# Patient Record
Sex: Female | Born: 1940
Health system: Southern US, Community
[De-identification: ages and names within clinical notes are randomized; demographics above are authoritative.]

## PROBLEM LIST (undated history)

## (undated) DIAGNOSIS — K635 Polyp of colon: Secondary | ICD-10-CM

## (undated) DIAGNOSIS — D259 Leiomyoma of uterus, unspecified: Secondary | ICD-10-CM

## (undated) DIAGNOSIS — N950 Postmenopausal bleeding: Secondary | ICD-10-CM

## (undated) DIAGNOSIS — E559 Vitamin D deficiency, unspecified: Secondary | ICD-10-CM

## (undated) DIAGNOSIS — E119 Type 2 diabetes mellitus without complications: Secondary | ICD-10-CM

## (undated) DIAGNOSIS — IMO0002 Reserved for concepts with insufficient information to code with codable children: Secondary | ICD-10-CM

## (undated) DIAGNOSIS — E785 Hyperlipidemia, unspecified: Secondary | ICD-10-CM

## (undated) DIAGNOSIS — N6019 Diffuse cystic mastopathy of unspecified breast: Secondary | ICD-10-CM

## (undated) DIAGNOSIS — M1990 Unspecified osteoarthritis, unspecified site: Secondary | ICD-10-CM

## (undated) DIAGNOSIS — N841 Polyp of cervix uteri: Secondary | ICD-10-CM

## (undated) DIAGNOSIS — I776 Arteritis, unspecified: Secondary | ICD-10-CM

## (undated) DIAGNOSIS — L509 Urticaria, unspecified: Secondary | ICD-10-CM

## (undated) DIAGNOSIS — I10 Essential (primary) hypertension: Secondary | ICD-10-CM

## (undated) HISTORY — DX: Diffuse cystic mastopathy of unspecified breast: N60.19

## (undated) HISTORY — DX: Urticaria, unspecified: L50.9

## (undated) HISTORY — DX: Polyp of colon: K63.5

## (undated) HISTORY — DX: Vitamin D deficiency, unspecified: E55.9

## (undated) HISTORY — DX: Reserved for concepts with insufficient information to code with codable children: IMO0002

## (undated) HISTORY — DX: Leiomyoma of uterus, unspecified: D25.9

## (undated) HISTORY — DX: Hyperlipidemia, unspecified: E78.5

## (undated) HISTORY — DX: Essential (primary) hypertension: I10

## (undated) HISTORY — DX: Type 2 diabetes mellitus without complications: E11.9

## (undated) HISTORY — DX: Unspecified osteoarthritis, unspecified site: M19.90

## (undated) HISTORY — PX: TONSILLECTOMY AND ADENOIDECTOMY: SUR1326

## (undated) HISTORY — DX: Postmenopausal bleeding: N95.0

## (undated) HISTORY — DX: Polyp of cervix uteri: N84.1

## (undated) HISTORY — DX: Arteritis, unspecified: I77.6

## (undated) HISTORY — PX: INCISION AND DRAINAGE: SHX5863

## (undated) HISTORY — PX: BREAST BIOPSY: SHX20

---

## 1981-03-18 DIAGNOSIS — IMO0002 Reserved for concepts with insufficient information to code with codable children: Secondary | ICD-10-CM

## 1981-03-18 DIAGNOSIS — R87619 Unspecified abnormal cytological findings in specimens from cervix uteri: Secondary | ICD-10-CM

## 1981-03-18 HISTORY — DX: Unspecified abnormal cytological findings in specimens from cervix uteri: R87.619

## 1981-03-18 HISTORY — DX: Reserved for concepts with insufficient information to code with codable children: IMO0002

## 1997-12-18 DIAGNOSIS — N841 Polyp of cervix uteri: Secondary | ICD-10-CM

## 1997-12-18 HISTORY — DX: Polyp of cervix uteri: N84.1

## 1998-02-15 DIAGNOSIS — N950 Postmenopausal bleeding: Secondary | ICD-10-CM

## 1998-02-15 HISTORY — DX: Postmenopausal bleeding: N95.0

## 1998-02-19 ENCOUNTER — Other Ambulatory Visit: Admission: RE | Admit: 1998-02-19 | Discharge: 1998-02-19 | Payer: Self-pay | Admitting: *Deleted

## 1998-12-20 ENCOUNTER — Other Ambulatory Visit: Admission: RE | Admit: 1998-12-20 | Discharge: 1998-12-20 | Payer: Self-pay | Admitting: *Deleted

## 1999-02-15 ENCOUNTER — Encounter: Payer: Self-pay | Admitting: Surgery

## 1999-02-15 ENCOUNTER — Ambulatory Visit (HOSPITAL_COMMUNITY): Admission: RE | Admit: 1999-02-15 | Discharge: 1999-02-15 | Payer: Self-pay | Admitting: Surgery

## 1999-07-20 ENCOUNTER — Ambulatory Visit (HOSPITAL_COMMUNITY): Admission: RE | Admit: 1999-07-20 | Discharge: 1999-07-20 | Payer: Self-pay | Admitting: *Deleted

## 1999-07-20 ENCOUNTER — Encounter: Payer: Self-pay | Admitting: *Deleted

## 1999-12-08 ENCOUNTER — Other Ambulatory Visit: Admission: RE | Admit: 1999-12-08 | Discharge: 1999-12-08 | Payer: Self-pay | Admitting: *Deleted

## 2000-02-09 ENCOUNTER — Encounter: Payer: Self-pay | Admitting: *Deleted

## 2000-02-09 ENCOUNTER — Encounter: Admission: RE | Admit: 2000-02-09 | Discharge: 2000-02-09 | Payer: Self-pay | Admitting: *Deleted

## 2000-02-20 ENCOUNTER — Encounter: Payer: Self-pay | Admitting: *Deleted

## 2000-02-20 ENCOUNTER — Encounter: Admission: RE | Admit: 2000-02-20 | Discharge: 2000-02-20 | Payer: Self-pay | Admitting: *Deleted

## 2000-05-16 ENCOUNTER — Encounter (INDEPENDENT_AMBULATORY_CARE_PROVIDER_SITE_OTHER): Payer: Self-pay | Admitting: *Deleted

## 2000-05-16 ENCOUNTER — Ambulatory Visit (HOSPITAL_COMMUNITY): Admission: RE | Admit: 2000-05-16 | Discharge: 2000-05-16 | Payer: Self-pay | Admitting: Gastroenterology

## 2000-12-14 ENCOUNTER — Other Ambulatory Visit: Admission: RE | Admit: 2000-12-14 | Discharge: 2000-12-14 | Payer: Self-pay | Admitting: *Deleted

## 2001-02-20 ENCOUNTER — Encounter: Payer: Self-pay | Admitting: *Deleted

## 2001-02-20 ENCOUNTER — Encounter: Admission: RE | Admit: 2001-02-20 | Discharge: 2001-02-20 | Payer: Self-pay | Admitting: *Deleted

## 2001-12-25 ENCOUNTER — Other Ambulatory Visit: Admission: RE | Admit: 2001-12-25 | Discharge: 2001-12-25 | Payer: Self-pay | Admitting: *Deleted

## 2002-03-05 ENCOUNTER — Encounter: Admission: RE | Admit: 2002-03-05 | Discharge: 2002-03-05 | Payer: Self-pay | Admitting: *Deleted

## 2002-03-05 ENCOUNTER — Encounter: Payer: Self-pay | Admitting: *Deleted

## 2003-01-01 ENCOUNTER — Other Ambulatory Visit: Admission: RE | Admit: 2003-01-01 | Discharge: 2003-01-01 | Payer: Self-pay | Admitting: *Deleted

## 2003-03-20 ENCOUNTER — Encounter: Payer: Self-pay | Admitting: *Deleted

## 2003-03-20 ENCOUNTER — Encounter: Admission: RE | Admit: 2003-03-20 | Discharge: 2003-03-20 | Payer: Self-pay | Admitting: *Deleted

## 2004-01-08 ENCOUNTER — Other Ambulatory Visit: Admission: RE | Admit: 2004-01-08 | Discharge: 2004-01-08 | Payer: Self-pay | Admitting: *Deleted

## 2004-04-12 ENCOUNTER — Encounter: Admission: RE | Admit: 2004-04-12 | Discharge: 2004-04-12 | Payer: Self-pay | Admitting: *Deleted

## 2004-04-15 ENCOUNTER — Encounter: Admission: RE | Admit: 2004-04-15 | Discharge: 2004-04-15 | Payer: Self-pay | Admitting: *Deleted

## 2004-11-01 ENCOUNTER — Ambulatory Visit (HOSPITAL_COMMUNITY): Admission: RE | Admit: 2004-11-01 | Discharge: 2004-11-01 | Payer: Self-pay | Admitting: Gastroenterology

## 2004-11-01 ENCOUNTER — Encounter (INDEPENDENT_AMBULATORY_CARE_PROVIDER_SITE_OTHER): Payer: Self-pay | Admitting: *Deleted

## 2005-02-23 ENCOUNTER — Ambulatory Visit: Payer: Self-pay | Admitting: Psychology

## 2005-03-02 ENCOUNTER — Ambulatory Visit: Payer: Self-pay | Admitting: Psychology

## 2005-03-20 ENCOUNTER — Ambulatory Visit: Payer: Self-pay | Admitting: Psychology

## 2005-04-07 ENCOUNTER — Ambulatory Visit: Payer: Self-pay | Admitting: Psychology

## 2005-04-20 ENCOUNTER — Encounter: Admission: RE | Admit: 2005-04-20 | Discharge: 2005-04-20 | Payer: Self-pay | Admitting: *Deleted

## 2005-04-24 ENCOUNTER — Ambulatory Visit: Payer: Self-pay | Admitting: Psychology

## 2005-06-02 ENCOUNTER — Ambulatory Visit: Payer: Self-pay | Admitting: Psychology

## 2005-06-24 ENCOUNTER — Ambulatory Visit: Payer: Self-pay | Admitting: Psychology

## 2005-07-20 ENCOUNTER — Ambulatory Visit: Payer: Self-pay | Admitting: Psychology

## 2005-08-03 ENCOUNTER — Ambulatory Visit: Payer: Self-pay | Admitting: Psychology

## 2005-09-05 ENCOUNTER — Ambulatory Visit: Payer: Self-pay | Admitting: Psychology

## 2005-10-27 ENCOUNTER — Ambulatory Visit: Payer: Self-pay | Admitting: Psychology

## 2005-11-24 ENCOUNTER — Ambulatory Visit: Payer: Self-pay | Admitting: Psychology

## 2005-12-15 ENCOUNTER — Ambulatory Visit: Payer: Self-pay | Admitting: Psychology

## 2006-01-19 ENCOUNTER — Other Ambulatory Visit: Admission: RE | Admit: 2006-01-19 | Discharge: 2006-01-19 | Payer: Self-pay | Admitting: Obstetrics & Gynecology

## 2006-01-19 ENCOUNTER — Ambulatory Visit: Payer: Self-pay | Admitting: Psychology

## 2006-02-23 ENCOUNTER — Ambulatory Visit: Payer: Self-pay | Admitting: Psychology

## 2006-04-06 ENCOUNTER — Ambulatory Visit: Payer: Self-pay | Admitting: Psychology

## 2006-05-18 ENCOUNTER — Ambulatory Visit: Payer: Self-pay | Admitting: Psychology

## 2006-05-25 ENCOUNTER — Encounter: Admission: RE | Admit: 2006-05-25 | Discharge: 2006-05-25 | Payer: Self-pay | Admitting: Obstetrics & Gynecology

## 2006-06-22 ENCOUNTER — Ambulatory Visit: Payer: Self-pay | Admitting: Psychology

## 2006-07-13 ENCOUNTER — Ambulatory Visit: Payer: Self-pay | Admitting: Psychology

## 2006-07-27 ENCOUNTER — Ambulatory Visit: Payer: Self-pay | Admitting: Psychology

## 2006-08-31 ENCOUNTER — Ambulatory Visit: Payer: Self-pay | Admitting: Psychology

## 2006-09-13 ENCOUNTER — Ambulatory Visit: Payer: Self-pay | Admitting: Psychology

## 2006-10-05 ENCOUNTER — Ambulatory Visit: Payer: Self-pay | Admitting: Psychology

## 2006-10-19 ENCOUNTER — Ambulatory Visit: Payer: Self-pay | Admitting: Psychology

## 2006-11-30 ENCOUNTER — Ambulatory Visit: Payer: Self-pay | Admitting: Psychology

## 2007-01-10 ENCOUNTER — Ambulatory Visit: Payer: Self-pay | Admitting: Psychology

## 2007-02-01 ENCOUNTER — Ambulatory Visit: Payer: Self-pay | Admitting: Psychology

## 2007-03-15 ENCOUNTER — Ambulatory Visit: Payer: Self-pay | Admitting: Psychology

## 2007-04-26 ENCOUNTER — Ambulatory Visit: Payer: Self-pay | Admitting: Psychology

## 2007-05-02 ENCOUNTER — Other Ambulatory Visit: Admission: RE | Admit: 2007-05-02 | Discharge: 2007-05-02 | Payer: Self-pay | Admitting: Obstetrics & Gynecology

## 2007-05-24 ENCOUNTER — Ambulatory Visit: Payer: Self-pay | Admitting: Psychology

## 2007-05-28 ENCOUNTER — Encounter: Admission: RE | Admit: 2007-05-28 | Discharge: 2007-05-28 | Payer: Self-pay | Admitting: Obstetrics & Gynecology

## 2007-06-12 ENCOUNTER — Ambulatory Visit: Payer: Self-pay | Admitting: Psychology

## 2007-07-18 ENCOUNTER — Ambulatory Visit: Payer: Self-pay | Admitting: Psychology

## 2007-08-16 ENCOUNTER — Ambulatory Visit: Payer: Self-pay | Admitting: Psychology

## 2007-09-27 ENCOUNTER — Ambulatory Visit: Payer: Self-pay | Admitting: Psychology

## 2007-10-25 ENCOUNTER — Ambulatory Visit: Payer: Self-pay | Admitting: Psychology

## 2007-11-22 ENCOUNTER — Ambulatory Visit: Payer: Self-pay | Admitting: Psychology

## 2008-04-03 ENCOUNTER — Encounter: Payer: Self-pay | Admitting: Internal Medicine

## 2008-05-07 ENCOUNTER — Other Ambulatory Visit: Admission: RE | Admit: 2008-05-07 | Discharge: 2008-05-07 | Payer: Self-pay | Admitting: Obstetrics & Gynecology

## 2008-05-14 ENCOUNTER — Ambulatory Visit: Payer: Self-pay | Admitting: Internal Medicine

## 2008-05-14 DIAGNOSIS — R059 Cough, unspecified: Secondary | ICD-10-CM | POA: Insufficient documentation

## 2008-05-14 DIAGNOSIS — E782 Mixed hyperlipidemia: Secondary | ICD-10-CM | POA: Insufficient documentation

## 2008-05-14 DIAGNOSIS — R05 Cough: Secondary | ICD-10-CM

## 2008-06-11 ENCOUNTER — Encounter: Admission: RE | Admit: 2008-06-11 | Discharge: 2008-06-11 | Payer: Self-pay | Admitting: Obstetrics & Gynecology

## 2008-06-17 ENCOUNTER — Encounter: Admission: RE | Admit: 2008-06-17 | Discharge: 2008-06-17 | Payer: Self-pay | Admitting: Obstetrics & Gynecology

## 2008-07-31 ENCOUNTER — Ambulatory Visit: Payer: Self-pay | Admitting: Internal Medicine

## 2009-02-23 ENCOUNTER — Encounter: Admission: RE | Admit: 2009-02-23 | Discharge: 2009-02-23 | Payer: Self-pay | Admitting: Obstetrics & Gynecology

## 2009-06-14 ENCOUNTER — Encounter: Admission: RE | Admit: 2009-06-14 | Discharge: 2009-06-14 | Payer: Self-pay | Admitting: Obstetrics & Gynecology

## 2010-01-13 ENCOUNTER — Encounter: Admission: RE | Admit: 2010-01-13 | Discharge: 2010-01-13 | Payer: Self-pay | Admitting: Family Medicine

## 2010-07-20 ENCOUNTER — Encounter: Admission: RE | Admit: 2010-07-20 | Discharge: 2010-07-20 | Payer: Self-pay | Admitting: Obstetrics & Gynecology

## 2010-08-05 ENCOUNTER — Telehealth: Payer: Self-pay | Admitting: Family Medicine

## 2011-01-09 ENCOUNTER — Encounter: Payer: Self-pay | Admitting: Obstetrics & Gynecology

## 2011-01-19 NOTE — Progress Notes (Signed)
Summary: Rx Premarin  Phone Note Call from Patient Call back at [phone]   Caller: Patient Call For: Dr. Milinda Antis Summary of Call: Patient is out of town on a family emergency.  She is requesting that Dr. Milinda Antis mail a Rx for Premarin to her home address.  This is not an emergency but she would like it within the next few weeks.  Medication is not listed on medication list, please advise. Initial call taken by: Linde Gillis CMA Duncan Dull),  August 05, 2010 4:58 PM  Follow-up for Phone Call        I do not think this note is in the right chart-- do not think she is my patient... Follow-up by: Judith Part MD,  August 08, 2010 8:45 AM  Additional Follow-up for Phone Call Additional follow up Details #1::        Left message on machine for patient to return call.  Linde Gillis CMA Duncan Dull)  August 08, 2010 10:09 AM   Left message on machine for patient to return call.  Linde Gillis CMA Duncan Dull)  August 09, 2010 9:16 AM   I have called patient several times and left messages and have not received a call back.  Will wait to hear back from patient. Additional Follow-up by: Linde Gillis CMA Duncan Dull),  August 09, 2010 1:56 PM

## 2011-05-05 NOTE — Procedures (Signed)
[place]  Patient:    Alexandra Williams, Alexandra Williams                     MRN: [id number] Proc. Date: 05/16/00 Adm. Date:  [id number] Disc. Date: [id number] Attending:  Nelda Marseille CC:         Chales Salmon. Abigail Miyamoto, M.D.                           Procedure Report  PROCEDURE:  Colonoscopy with biopsy.  ENDOSCOPIST:  Petra Kuba, M.D.  INDICATIONS FOR PROCEDURE:  Bright red blood per rectum.  Consent was signed after risks, benefits, methods, and options were thoroughly discussed in the office.  MEDICINES USED:  Demerol 80 and Versed 5.  DESCRIPTION OF PROCEDURE:  Rectal inspection was pertinent for external hemorrhoids.  Digital exam was negative.  Video colonoscope was inserted and fairly easily advanced around the colon to the cecum.  This did require some left lower quadrant pressure, but no position changes.  The cecum was identified by the appendiceal orifice and the ileocecal valve.  In fact, the scope was inserted a short ways in the terminal ileum which was normal.  Photo documentation was obtained.  The scope was slowly withdrawn.  The prep was adequate.  There was some liquid stool that required washing and suctioning. In the cecal pole, a 2-3 mm polyp was seen.  It was cold biopsied x 3.  As the scope was withdrawn slowly around the colon in the ________ hepatic flexure, a 3 cm polyp was seen and was hot biopsied x 2.  The scope was further withdrawn.  No other polypoid lesions were seen.  She did have a tortuous colon and when we _________ a loop, we did try to readvance around the colon, but no other abnormalities were seen until we withdrawn back to the rectum where a tiny 1-2 mm polyp was seen and was hot biopsied x 1.  All polyps were put in the same container.  Once back in the rectum, the scope was then retroflexed revealing some internal hemorrhoids.  The scope was straightened and readvanced and short ways around the sigmoid.  Air  was suctioned and the scope removed.  The patient tolerated the procedure well.  There was no obvious or immediate complication.  ENDOSCOPIC DIAGNOSED: 1. Internal and external hemorrhoids. 2. Three tiny small polyps, hot biopsied in the rectum and hepatic flexure    with cold biopsy to the cecum. 3. Tortuous colon. 4. Otherwise within normal limits to the terminal ileum.  PLAN:  Await pathology to determine future colonic screening.  Followup in six to eight weeks to recheck symptoms, guaiacs, and make sure no further workup plans are needed.  Have her call me sooner or p.r.n.  Otherwise, put her on one week of no aspirin and nonsteroidals, and placed on _________. DD:  05/16/00 TD:  05/20/00 Job: [id number]

## 2011-05-05 NOTE — Op Note (Signed)
Alexandra Williams, STEFFENSEN NO.:  [id number]   MEDICAL RECORD NO.:  [id number]          PATIENT TYPE:  AMB   LOCATION:  ENDO                         FACILITY:  [facility]   PHYSICIAN:  Petra Kuba, M.D.    DATE OF BIRTH:  [birth year]   DATE OF PROCEDURE:  11/01/2004  DATE OF DISCHARGE:                                 OPERATIVE REPORT   PROCEDURE:  Colonoscopy with polypectomy.   INDICATIONS FOR PROCEDURE:  History of colon polyps due for repeat  screening.  Consent was signed after risks, benefits, methods, and options  were thoroughly discussed in the office in the past.   MEDICATIONS:  Demerol 60, Versed 6.   DESCRIPTION OF PROCEDURE:  Rectal inspection was pertinent for external  hemorrhoids. Digital exam was negative. The pediatric video adjustable  colonoscope was inserted, easily advanced to the mid transverse.  At that  point, there was looping and with rolling her on her back and then on her  right side and various abdominal pressures was able to be advanced to the  cecum which was identified by the appendiceal orifice and the ileocecal  valve. In the cecum, a small polyp was seen, snared, electrocautery applied  and the polyp was suctioned through the scope and collected in the trap.  Possibly there was a touch of polypoid tissue remaining and one hot biopsy  of the edge was obtained.  We went ahead and for a minimal amount of  bleeding from the hot biopsy site touched the area with the hot biopsy  forceps with good cessation of bleeding.  There was another tiny polyp at  the cecum ascending junction which was hot biopsied x1 and put in the same  container. The scope was slowly withdrawn.  Other than a tiny proximal  sigmoid polyp which was hot biopsied x1 and put in the second container, no  other abnormalities were seen as we slowly withdrew back to the rectum. Anal  rectal pullthrough and retroflexion confirmed some small hemorrhoids. The  scope was  straightened and readvanced a short ways up the left side of the  colon, air was suctioned, scope removed. The patient tolerated the procedure  well. There was no obvious or immediate complications.   ENDOSCOPIC DIAGNOSIS:  1.  Internal and external small hemorrhoids.  2.  Questionable tiny proximal sigmoid polyp hot biopsied.  3.  A tiny cecal polyp hot biopsied.  4.  Cecal small polyp snared and hot biopsied.  5.  Otherwise within normal limits to the cecum.   PLAN:  Await pathology to determine future colonic screening.  Happy to see  back p.r.n. otherwise return care to Dr. Abigail Miyamoto for the customary health  care maintenance to include yearly rectals and guaiacs.      MEM/MEDQ  D:  11/01/2004  T:  11/01/2004  Job:  [id number]   cc:   Chales Salmon. Abigail Miyamoto, M.D.  [address]  [place]  [place] [address]  Fax: [phone]

## 2011-05-12 ENCOUNTER — Other Ambulatory Visit: Payer: Self-pay

## 2011-07-21 ENCOUNTER — Other Ambulatory Visit: Payer: Self-pay | Admitting: Obstetrics & Gynecology

## 2011-07-21 DIAGNOSIS — Z1231 Encounter for screening mammogram for malignant neoplasm of breast: Secondary | ICD-10-CM

## 2011-08-01 ENCOUNTER — Ambulatory Visit
Admission: RE | Admit: 2011-08-01 | Discharge: 2011-08-01 | Disposition: A | Discharge status: Home / Self Care | Payer: Medicare Other | Source: Ambulatory Visit | Attending: Obstetrics & Gynecology | Admitting: Obstetrics & Gynecology

## 2011-08-01 DIAGNOSIS — Z1231 Encounter for screening mammogram for malignant neoplasm of breast: Secondary | ICD-10-CM

## 2012-03-28 DIAGNOSIS — H2510 Age-related nuclear cataract, unspecified eye: Secondary | ICD-10-CM | POA: Diagnosis not present

## 2012-04-16 DIAGNOSIS — I10 Essential (primary) hypertension: Secondary | ICD-10-CM | POA: Diagnosis not present

## 2012-04-16 DIAGNOSIS — R7301 Impaired fasting glucose: Secondary | ICD-10-CM | POA: Diagnosis not present

## 2012-04-16 DIAGNOSIS — Z23 Encounter for immunization: Secondary | ICD-10-CM | POA: Diagnosis not present

## 2012-04-16 DIAGNOSIS — E782 Mixed hyperlipidemia: Secondary | ICD-10-CM | POA: Diagnosis not present

## 2012-04-16 DIAGNOSIS — Z800 Family history of malignant neoplasm of digestive organs: Secondary | ICD-10-CM | POA: Diagnosis not present

## 2012-04-16 DIAGNOSIS — Z0000 Encounter for general adult medical examination without abnormal findings: Secondary | ICD-10-CM | POA: Diagnosis not present

## 2012-05-24 DIAGNOSIS — IMO0002 Reserved for concepts with insufficient information to code with codable children: Secondary | ICD-10-CM | POA: Diagnosis not present

## 2012-05-24 DIAGNOSIS — M1710 Unilateral primary osteoarthritis, unspecified knee: Secondary | ICD-10-CM | POA: Diagnosis not present

## 2012-06-26 DIAGNOSIS — IMO0002 Reserved for concepts with insufficient information to code with codable children: Secondary | ICD-10-CM | POA: Diagnosis not present

## 2012-06-26 DIAGNOSIS — M1710 Unilateral primary osteoarthritis, unspecified knee: Secondary | ICD-10-CM | POA: Diagnosis not present

## 2012-06-27 DIAGNOSIS — Z124 Encounter for screening for malignant neoplasm of cervix: Secondary | ICD-10-CM | POA: Diagnosis not present

## 2012-06-27 DIAGNOSIS — Z01419 Encounter for gynecological examination (general) (routine) without abnormal findings: Secondary | ICD-10-CM | POA: Diagnosis not present

## 2012-06-27 DIAGNOSIS — N859 Noninflammatory disorder of uterus, unspecified: Secondary | ICD-10-CM | POA: Diagnosis not present

## 2012-06-27 DIAGNOSIS — N950 Postmenopausal bleeding: Secondary | ICD-10-CM | POA: Diagnosis not present

## 2012-07-03 ENCOUNTER — Other Ambulatory Visit: Payer: Self-pay | Admitting: Obstetrics & Gynecology

## 2012-07-03 DIAGNOSIS — M1710 Unilateral primary osteoarthritis, unspecified knee: Secondary | ICD-10-CM | POA: Diagnosis not present

## 2012-07-03 DIAGNOSIS — IMO0002 Reserved for concepts with insufficient information to code with codable children: Secondary | ICD-10-CM | POA: Diagnosis not present

## 2012-07-03 DIAGNOSIS — Z1231 Encounter for screening mammogram for malignant neoplasm of breast: Secondary | ICD-10-CM

## 2012-07-09 DIAGNOSIS — M1710 Unilateral primary osteoarthritis, unspecified knee: Secondary | ICD-10-CM | POA: Diagnosis not present

## 2012-07-09 DIAGNOSIS — IMO0002 Reserved for concepts with insufficient information to code with codable children: Secondary | ICD-10-CM | POA: Diagnosis not present

## 2012-08-02 ENCOUNTER — Ambulatory Visit
Admission: RE | Admit: 2012-08-02 | Discharge: 2012-08-02 | Disposition: A | Discharge status: Home / Self Care | Payer: Medicare Other | Source: Ambulatory Visit | Attending: Obstetrics & Gynecology | Admitting: Obstetrics & Gynecology

## 2012-08-02 DIAGNOSIS — Z1231 Encounter for screening mammogram for malignant neoplasm of breast: Secondary | ICD-10-CM

## 2012-08-06 DIAGNOSIS — L570 Actinic keratosis: Secondary | ICD-10-CM | POA: Diagnosis not present

## 2012-08-06 DIAGNOSIS — L821 Other seborrheic keratosis: Secondary | ICD-10-CM | POA: Diagnosis not present

## 2012-08-06 DIAGNOSIS — D239 Other benign neoplasm of skin, unspecified: Secondary | ICD-10-CM | POA: Diagnosis not present

## 2012-08-22 DIAGNOSIS — IMO0002 Reserved for concepts with insufficient information to code with codable children: Secondary | ICD-10-CM | POA: Diagnosis not present

## 2012-08-22 DIAGNOSIS — M1710 Unilateral primary osteoarthritis, unspecified knee: Secondary | ICD-10-CM | POA: Diagnosis not present

## 2012-09-11 DIAGNOSIS — Z23 Encounter for immunization: Secondary | ICD-10-CM | POA: Diagnosis not present

## 2012-10-15 DIAGNOSIS — Z79899 Other long term (current) drug therapy: Secondary | ICD-10-CM | POA: Diagnosis not present

## 2012-10-15 DIAGNOSIS — L509 Urticaria, unspecified: Secondary | ICD-10-CM | POA: Diagnosis not present

## 2012-12-16 DIAGNOSIS — I10 Essential (primary) hypertension: Secondary | ICD-10-CM | POA: Diagnosis not present

## 2012-12-16 DIAGNOSIS — L501 Idiopathic urticaria: Secondary | ICD-10-CM | POA: Diagnosis not present

## 2013-01-06 DIAGNOSIS — L509 Urticaria, unspecified: Secondary | ICD-10-CM | POA: Diagnosis not present

## 2013-01-20 DIAGNOSIS — J3089 Other allergic rhinitis: Secondary | ICD-10-CM | POA: Diagnosis not present

## 2013-01-20 DIAGNOSIS — L501 Idiopathic urticaria: Secondary | ICD-10-CM | POA: Diagnosis not present

## 2013-01-20 DIAGNOSIS — J301 Allergic rhinitis due to pollen: Secondary | ICD-10-CM | POA: Diagnosis not present

## 2013-02-24 DIAGNOSIS — J3089 Other allergic rhinitis: Secondary | ICD-10-CM | POA: Diagnosis not present

## 2013-02-24 DIAGNOSIS — L501 Idiopathic urticaria: Secondary | ICD-10-CM | POA: Diagnosis not present

## 2013-02-24 DIAGNOSIS — J301 Allergic rhinitis due to pollen: Secondary | ICD-10-CM | POA: Diagnosis not present

## 2013-03-04 ENCOUNTER — Ambulatory Visit
Admission: RE | Admit: 2013-03-04 | Discharge: 2013-03-04 | Disposition: A | Discharge status: Home / Self Care | Payer: Medicare Other | Source: Ambulatory Visit | Attending: Chiropractor | Admitting: Chiropractor

## 2013-03-04 ENCOUNTER — Other Ambulatory Visit: Payer: Self-pay | Admitting: Chiropractic Medicine

## 2013-03-04 DIAGNOSIS — M542 Cervicalgia: Secondary | ICD-10-CM

## 2013-03-04 DIAGNOSIS — S239XXA Sprain of unspecified parts of thorax, initial encounter: Secondary | ICD-10-CM | POA: Diagnosis not present

## 2013-03-04 DIAGNOSIS — M47812 Spondylosis without myelopathy or radiculopathy, cervical region: Secondary | ICD-10-CM | POA: Diagnosis not present

## 2013-03-04 DIAGNOSIS — M25511 Pain in right shoulder: Secondary | ICD-10-CM

## 2013-03-04 DIAGNOSIS — M999 Biomechanical lesion, unspecified: Secondary | ICD-10-CM | POA: Diagnosis not present

## 2013-03-04 DIAGNOSIS — M9981 Other biomechanical lesions of cervical region: Secondary | ICD-10-CM | POA: Diagnosis not present

## 2013-03-04 DIAGNOSIS — M25519 Pain in unspecified shoulder: Secondary | ICD-10-CM | POA: Diagnosis not present

## 2013-03-04 DIAGNOSIS — M5030 Other cervical disc degeneration, unspecified cervical region: Secondary | ICD-10-CM | POA: Diagnosis not present

## 2013-03-06 DIAGNOSIS — M9981 Other biomechanical lesions of cervical region: Secondary | ICD-10-CM | POA: Diagnosis not present

## 2013-03-06 DIAGNOSIS — M5030 Other cervical disc degeneration, unspecified cervical region: Secondary | ICD-10-CM | POA: Diagnosis not present

## 2013-03-06 DIAGNOSIS — S239XXA Sprain of unspecified parts of thorax, initial encounter: Secondary | ICD-10-CM | POA: Diagnosis not present

## 2013-03-06 DIAGNOSIS — M999 Biomechanical lesion, unspecified: Secondary | ICD-10-CM | POA: Diagnosis not present

## 2013-03-10 DIAGNOSIS — M9981 Other biomechanical lesions of cervical region: Secondary | ICD-10-CM | POA: Diagnosis not present

## 2013-03-10 DIAGNOSIS — M999 Biomechanical lesion, unspecified: Secondary | ICD-10-CM | POA: Diagnosis not present

## 2013-03-10 DIAGNOSIS — L501 Idiopathic urticaria: Secondary | ICD-10-CM | POA: Diagnosis not present

## 2013-03-10 DIAGNOSIS — S239XXA Sprain of unspecified parts of thorax, initial encounter: Secondary | ICD-10-CM | POA: Diagnosis not present

## 2013-03-10 DIAGNOSIS — M5030 Other cervical disc degeneration, unspecified cervical region: Secondary | ICD-10-CM | POA: Diagnosis not present

## 2013-03-11 DIAGNOSIS — M999 Biomechanical lesion, unspecified: Secondary | ICD-10-CM | POA: Diagnosis not present

## 2013-03-11 DIAGNOSIS — M5030 Other cervical disc degeneration, unspecified cervical region: Secondary | ICD-10-CM | POA: Diagnosis not present

## 2013-03-11 DIAGNOSIS — S239XXA Sprain of unspecified parts of thorax, initial encounter: Secondary | ICD-10-CM | POA: Diagnosis not present

## 2013-03-11 DIAGNOSIS — M9981 Other biomechanical lesions of cervical region: Secondary | ICD-10-CM | POA: Diagnosis not present

## 2013-03-13 DIAGNOSIS — M9981 Other biomechanical lesions of cervical region: Secondary | ICD-10-CM | POA: Diagnosis not present

## 2013-03-13 DIAGNOSIS — M999 Biomechanical lesion, unspecified: Secondary | ICD-10-CM | POA: Diagnosis not present

## 2013-03-13 DIAGNOSIS — S239XXA Sprain of unspecified parts of thorax, initial encounter: Secondary | ICD-10-CM | POA: Diagnosis not present

## 2013-03-13 DIAGNOSIS — M5030 Other cervical disc degeneration, unspecified cervical region: Secondary | ICD-10-CM | POA: Diagnosis not present

## 2013-03-13 DIAGNOSIS — M25519 Pain in unspecified shoulder: Secondary | ICD-10-CM | POA: Diagnosis not present

## 2013-03-17 DIAGNOSIS — M9981 Other biomechanical lesions of cervical region: Secondary | ICD-10-CM | POA: Diagnosis not present

## 2013-03-17 DIAGNOSIS — S239XXA Sprain of unspecified parts of thorax, initial encounter: Secondary | ICD-10-CM | POA: Diagnosis not present

## 2013-03-17 DIAGNOSIS — M5030 Other cervical disc degeneration, unspecified cervical region: Secondary | ICD-10-CM | POA: Diagnosis not present

## 2013-03-17 DIAGNOSIS — M25519 Pain in unspecified shoulder: Secondary | ICD-10-CM | POA: Diagnosis not present

## 2013-03-17 DIAGNOSIS — M999 Biomechanical lesion, unspecified: Secondary | ICD-10-CM | POA: Diagnosis not present

## 2013-03-18 DIAGNOSIS — M25519 Pain in unspecified shoulder: Secondary | ICD-10-CM | POA: Diagnosis not present

## 2013-03-18 DIAGNOSIS — M999 Biomechanical lesion, unspecified: Secondary | ICD-10-CM | POA: Diagnosis not present

## 2013-03-18 DIAGNOSIS — M9981 Other biomechanical lesions of cervical region: Secondary | ICD-10-CM | POA: Diagnosis not present

## 2013-03-18 DIAGNOSIS — M5030 Other cervical disc degeneration, unspecified cervical region: Secondary | ICD-10-CM | POA: Diagnosis not present

## 2013-03-18 DIAGNOSIS — S239XXA Sprain of unspecified parts of thorax, initial encounter: Secondary | ICD-10-CM | POA: Diagnosis not present

## 2013-03-20 DIAGNOSIS — M5030 Other cervical disc degeneration, unspecified cervical region: Secondary | ICD-10-CM | POA: Diagnosis not present

## 2013-03-20 DIAGNOSIS — M25519 Pain in unspecified shoulder: Secondary | ICD-10-CM | POA: Diagnosis not present

## 2013-03-20 DIAGNOSIS — S239XXA Sprain of unspecified parts of thorax, initial encounter: Secondary | ICD-10-CM | POA: Diagnosis not present

## 2013-03-20 DIAGNOSIS — M999 Biomechanical lesion, unspecified: Secondary | ICD-10-CM | POA: Diagnosis not present

## 2013-03-20 DIAGNOSIS — M9981 Other biomechanical lesions of cervical region: Secondary | ICD-10-CM | POA: Diagnosis not present

## 2013-03-27 DIAGNOSIS — M9981 Other biomechanical lesions of cervical region: Secondary | ICD-10-CM | POA: Diagnosis not present

## 2013-03-27 DIAGNOSIS — M25519 Pain in unspecified shoulder: Secondary | ICD-10-CM | POA: Diagnosis not present

## 2013-03-27 DIAGNOSIS — S239XXA Sprain of unspecified parts of thorax, initial encounter: Secondary | ICD-10-CM | POA: Diagnosis not present

## 2013-03-27 DIAGNOSIS — M999 Biomechanical lesion, unspecified: Secondary | ICD-10-CM | POA: Diagnosis not present

## 2013-03-27 DIAGNOSIS — M5030 Other cervical disc degeneration, unspecified cervical region: Secondary | ICD-10-CM | POA: Diagnosis not present

## 2013-04-01 DIAGNOSIS — M25519 Pain in unspecified shoulder: Secondary | ICD-10-CM | POA: Diagnosis not present

## 2013-04-01 DIAGNOSIS — M999 Biomechanical lesion, unspecified: Secondary | ICD-10-CM | POA: Diagnosis not present

## 2013-04-01 DIAGNOSIS — S239XXA Sprain of unspecified parts of thorax, initial encounter: Secondary | ICD-10-CM | POA: Diagnosis not present

## 2013-04-01 DIAGNOSIS — M5030 Other cervical disc degeneration, unspecified cervical region: Secondary | ICD-10-CM | POA: Diagnosis not present

## 2013-04-01 DIAGNOSIS — M9981 Other biomechanical lesions of cervical region: Secondary | ICD-10-CM | POA: Diagnosis not present

## 2013-04-02 DIAGNOSIS — S239XXA Sprain of unspecified parts of thorax, initial encounter: Secondary | ICD-10-CM | POA: Diagnosis not present

## 2013-04-02 DIAGNOSIS — M999 Biomechanical lesion, unspecified: Secondary | ICD-10-CM | POA: Diagnosis not present

## 2013-04-02 DIAGNOSIS — M5030 Other cervical disc degeneration, unspecified cervical region: Secondary | ICD-10-CM | POA: Diagnosis not present

## 2013-04-02 DIAGNOSIS — M9981 Other biomechanical lesions of cervical region: Secondary | ICD-10-CM | POA: Diagnosis not present

## 2013-04-02 DIAGNOSIS — M25519 Pain in unspecified shoulder: Secondary | ICD-10-CM | POA: Diagnosis not present

## 2013-04-06 DIAGNOSIS — I10 Essential (primary) hypertension: Secondary | ICD-10-CM | POA: Diagnosis not present

## 2013-04-06 DIAGNOSIS — R404 Transient alteration of awareness: Secondary | ICD-10-CM | POA: Diagnosis not present

## 2013-04-06 DIAGNOSIS — R5381 Other malaise: Secondary | ICD-10-CM | POA: Diagnosis not present

## 2013-04-06 DIAGNOSIS — R079 Chest pain, unspecified: Secondary | ICD-10-CM | POA: Diagnosis not present

## 2013-04-06 DIAGNOSIS — R609 Edema, unspecified: Secondary | ICD-10-CM | POA: Diagnosis not present

## 2013-04-06 DIAGNOSIS — J811 Chronic pulmonary edema: Secondary | ICD-10-CM | POA: Diagnosis not present

## 2013-04-06 DIAGNOSIS — R5383 Other fatigue: Secondary | ICD-10-CM | POA: Diagnosis not present

## 2013-04-06 DIAGNOSIS — M79609 Pain in unspecified limb: Secondary | ICD-10-CM | POA: Diagnosis not present

## 2013-04-08 DIAGNOSIS — M9981 Other biomechanical lesions of cervical region: Secondary | ICD-10-CM | POA: Diagnosis not present

## 2013-04-08 DIAGNOSIS — R55 Syncope and collapse: Secondary | ICD-10-CM | POA: Diagnosis not present

## 2013-04-08 DIAGNOSIS — S239XXA Sprain of unspecified parts of thorax, initial encounter: Secondary | ICD-10-CM | POA: Diagnosis not present

## 2013-04-08 DIAGNOSIS — M999 Biomechanical lesion, unspecified: Secondary | ICD-10-CM | POA: Diagnosis not present

## 2013-04-08 DIAGNOSIS — M5030 Other cervical disc degeneration, unspecified cervical region: Secondary | ICD-10-CM | POA: Diagnosis not present

## 2013-04-08 DIAGNOSIS — M25519 Pain in unspecified shoulder: Secondary | ICD-10-CM | POA: Diagnosis not present

## 2013-04-09 DIAGNOSIS — M999 Biomechanical lesion, unspecified: Secondary | ICD-10-CM | POA: Diagnosis not present

## 2013-04-09 DIAGNOSIS — M25519 Pain in unspecified shoulder: Secondary | ICD-10-CM | POA: Diagnosis not present

## 2013-04-09 DIAGNOSIS — S239XXA Sprain of unspecified parts of thorax, initial encounter: Secondary | ICD-10-CM | POA: Diagnosis not present

## 2013-04-09 DIAGNOSIS — M5030 Other cervical disc degeneration, unspecified cervical region: Secondary | ICD-10-CM | POA: Diagnosis not present

## 2013-04-09 DIAGNOSIS — M9981 Other biomechanical lesions of cervical region: Secondary | ICD-10-CM | POA: Diagnosis not present

## 2013-04-11 ENCOUNTER — Other Ambulatory Visit: Payer: Self-pay | Admitting: Family Medicine

## 2013-04-11 DIAGNOSIS — R55 Syncope and collapse: Secondary | ICD-10-CM

## 2013-04-14 ENCOUNTER — Ambulatory Visit
Admission: RE | Admit: 2013-04-14 | Discharge: 2013-04-14 | Disposition: A | Discharge status: Home / Self Care | Payer: Medicare Other | Source: Ambulatory Visit | Attending: Family Medicine | Admitting: Family Medicine

## 2013-04-14 DIAGNOSIS — M9981 Other biomechanical lesions of cervical region: Secondary | ICD-10-CM | POA: Diagnosis not present

## 2013-04-14 DIAGNOSIS — I658 Occlusion and stenosis of other precerebral arteries: Secondary | ICD-10-CM | POA: Diagnosis not present

## 2013-04-14 DIAGNOSIS — M999 Biomechanical lesion, unspecified: Secondary | ICD-10-CM | POA: Diagnosis not present

## 2013-04-14 DIAGNOSIS — R55 Syncope and collapse: Secondary | ICD-10-CM

## 2013-04-14 DIAGNOSIS — M5030 Other cervical disc degeneration, unspecified cervical region: Secondary | ICD-10-CM | POA: Diagnosis not present

## 2013-04-14 DIAGNOSIS — S239XXA Sprain of unspecified parts of thorax, initial encounter: Secondary | ICD-10-CM | POA: Diagnosis not present

## 2013-04-14 DIAGNOSIS — M25519 Pain in unspecified shoulder: Secondary | ICD-10-CM | POA: Diagnosis not present

## 2013-04-15 DIAGNOSIS — J3089 Other allergic rhinitis: Secondary | ICD-10-CM | POA: Diagnosis not present

## 2013-04-15 DIAGNOSIS — J301 Allergic rhinitis due to pollen: Secondary | ICD-10-CM | POA: Diagnosis not present

## 2013-04-15 DIAGNOSIS — R609 Edema, unspecified: Secondary | ICD-10-CM | POA: Diagnosis not present

## 2013-04-15 DIAGNOSIS — L501 Idiopathic urticaria: Secondary | ICD-10-CM | POA: Diagnosis not present

## 2013-04-16 DIAGNOSIS — M9981 Other biomechanical lesions of cervical region: Secondary | ICD-10-CM | POA: Diagnosis not present

## 2013-04-16 DIAGNOSIS — M25519 Pain in unspecified shoulder: Secondary | ICD-10-CM | POA: Diagnosis not present

## 2013-04-16 DIAGNOSIS — M5030 Other cervical disc degeneration, unspecified cervical region: Secondary | ICD-10-CM | POA: Diagnosis not present

## 2013-04-16 DIAGNOSIS — S239XXA Sprain of unspecified parts of thorax, initial encounter: Secondary | ICD-10-CM | POA: Diagnosis not present

## 2013-04-16 DIAGNOSIS — M999 Biomechanical lesion, unspecified: Secondary | ICD-10-CM | POA: Diagnosis not present

## 2013-04-21 ENCOUNTER — Telehealth: Payer: Self-pay | Admitting: *Deleted

## 2013-04-21 DIAGNOSIS — Z0000 Encounter for general adult medical examination without abnormal findings: Secondary | ICD-10-CM | POA: Diagnosis not present

## 2013-04-21 DIAGNOSIS — E782 Mixed hyperlipidemia: Secondary | ICD-10-CM | POA: Diagnosis not present

## 2013-04-21 DIAGNOSIS — L508 Other urticaria: Secondary | ICD-10-CM | POA: Diagnosis not present

## 2013-04-21 DIAGNOSIS — R609 Edema, unspecified: Secondary | ICD-10-CM | POA: Diagnosis not present

## 2013-04-21 DIAGNOSIS — I10 Essential (primary) hypertension: Secondary | ICD-10-CM | POA: Diagnosis not present

## 2013-04-21 MED ORDER — ZOLPIDEM TARTRATE 10 MG PO TABS: 10.0000 mg | ORAL_TABLET | Freq: Every evening | ORAL | Status: DC | PRN

## 2013-04-21 NOTE — Telephone Encounter (Signed)
Patient last annual was 06/27/12 and next is scheduled for 07/17/13

## 2013-04-21 NOTE — Telephone Encounter (Signed)
rx done on EPIC.  Should print and will need to be faxed.

## 2013-04-22 DIAGNOSIS — M999 Biomechanical lesion, unspecified: Secondary | ICD-10-CM | POA: Diagnosis not present

## 2013-04-22 DIAGNOSIS — S239XXA Sprain of unspecified parts of thorax, initial encounter: Secondary | ICD-10-CM | POA: Diagnosis not present

## 2013-04-22 DIAGNOSIS — M9981 Other biomechanical lesions of cervical region: Secondary | ICD-10-CM | POA: Diagnosis not present

## 2013-04-22 DIAGNOSIS — M5030 Other cervical disc degeneration, unspecified cervical region: Secondary | ICD-10-CM | POA: Diagnosis not present

## 2013-04-22 DIAGNOSIS — M25519 Pain in unspecified shoulder: Secondary | ICD-10-CM | POA: Diagnosis not present

## 2013-04-25 DIAGNOSIS — I10 Essential (primary) hypertension: Secondary | ICD-10-CM | POA: Diagnosis not present

## 2013-04-25 DIAGNOSIS — R55 Syncope and collapse: Secondary | ICD-10-CM | POA: Diagnosis not present

## 2013-04-25 DIAGNOSIS — R609 Edema, unspecified: Secondary | ICD-10-CM | POA: Diagnosis not present

## 2013-04-25 DIAGNOSIS — R011 Cardiac murmur, unspecified: Secondary | ICD-10-CM | POA: Diagnosis not present

## 2013-04-29 DIAGNOSIS — S239XXA Sprain of unspecified parts of thorax, initial encounter: Secondary | ICD-10-CM | POA: Diagnosis not present

## 2013-04-29 DIAGNOSIS — M999 Biomechanical lesion, unspecified: Secondary | ICD-10-CM | POA: Diagnosis not present

## 2013-04-29 DIAGNOSIS — M9981 Other biomechanical lesions of cervical region: Secondary | ICD-10-CM | POA: Diagnosis not present

## 2013-04-29 DIAGNOSIS — M25519 Pain in unspecified shoulder: Secondary | ICD-10-CM | POA: Diagnosis not present

## 2013-04-29 DIAGNOSIS — M5030 Other cervical disc degeneration, unspecified cervical region: Secondary | ICD-10-CM | POA: Diagnosis not present

## 2013-05-01 DIAGNOSIS — R011 Cardiac murmur, unspecified: Secondary | ICD-10-CM | POA: Diagnosis not present

## 2013-05-01 DIAGNOSIS — R55 Syncope and collapse: Secondary | ICD-10-CM | POA: Diagnosis not present

## 2013-05-01 DIAGNOSIS — L501 Idiopathic urticaria: Secondary | ICD-10-CM | POA: Diagnosis not present

## 2013-05-01 DIAGNOSIS — I10 Essential (primary) hypertension: Secondary | ICD-10-CM | POA: Diagnosis not present

## 2013-05-01 DIAGNOSIS — R609 Edema, unspecified: Secondary | ICD-10-CM | POA: Diagnosis not present

## 2013-05-06 DIAGNOSIS — R609 Edema, unspecified: Secondary | ICD-10-CM | POA: Diagnosis not present

## 2013-05-06 DIAGNOSIS — I10 Essential (primary) hypertension: Secondary | ICD-10-CM | POA: Diagnosis not present

## 2013-05-06 DIAGNOSIS — R011 Cardiac murmur, unspecified: Secondary | ICD-10-CM | POA: Diagnosis not present

## 2013-05-06 DIAGNOSIS — R55 Syncope and collapse: Secondary | ICD-10-CM | POA: Diagnosis not present

## 2013-05-07 DIAGNOSIS — M5030 Other cervical disc degeneration, unspecified cervical region: Secondary | ICD-10-CM | POA: Diagnosis not present

## 2013-05-07 DIAGNOSIS — M25519 Pain in unspecified shoulder: Secondary | ICD-10-CM | POA: Diagnosis not present

## 2013-05-07 DIAGNOSIS — M9981 Other biomechanical lesions of cervical region: Secondary | ICD-10-CM | POA: Diagnosis not present

## 2013-05-07 DIAGNOSIS — S239XXA Sprain of unspecified parts of thorax, initial encounter: Secondary | ICD-10-CM | POA: Diagnosis not present

## 2013-05-07 DIAGNOSIS — M999 Biomechanical lesion, unspecified: Secondary | ICD-10-CM | POA: Diagnosis not present

## 2013-05-13 DIAGNOSIS — M9981 Other biomechanical lesions of cervical region: Secondary | ICD-10-CM | POA: Diagnosis not present

## 2013-05-13 DIAGNOSIS — M999 Biomechanical lesion, unspecified: Secondary | ICD-10-CM | POA: Diagnosis not present

## 2013-05-13 DIAGNOSIS — M25519 Pain in unspecified shoulder: Secondary | ICD-10-CM | POA: Diagnosis not present

## 2013-05-13 DIAGNOSIS — S239XXA Sprain of unspecified parts of thorax, initial encounter: Secondary | ICD-10-CM | POA: Diagnosis not present

## 2013-05-13 DIAGNOSIS — M5030 Other cervical disc degeneration, unspecified cervical region: Secondary | ICD-10-CM | POA: Diagnosis not present

## 2013-05-20 DIAGNOSIS — S239XXA Sprain of unspecified parts of thorax, initial encounter: Secondary | ICD-10-CM | POA: Diagnosis not present

## 2013-05-20 DIAGNOSIS — M999 Biomechanical lesion, unspecified: Secondary | ICD-10-CM | POA: Diagnosis not present

## 2013-05-20 DIAGNOSIS — M25519 Pain in unspecified shoulder: Secondary | ICD-10-CM | POA: Diagnosis not present

## 2013-05-20 DIAGNOSIS — M5030 Other cervical disc degeneration, unspecified cervical region: Secondary | ICD-10-CM | POA: Diagnosis not present

## 2013-05-20 DIAGNOSIS — M9981 Other biomechanical lesions of cervical region: Secondary | ICD-10-CM | POA: Diagnosis not present

## 2013-05-22 DIAGNOSIS — J301 Allergic rhinitis due to pollen: Secondary | ICD-10-CM | POA: Diagnosis not present

## 2013-05-22 DIAGNOSIS — L501 Idiopathic urticaria: Secondary | ICD-10-CM | POA: Diagnosis not present

## 2013-05-22 DIAGNOSIS — T783XXA Angioneurotic edema, initial encounter: Secondary | ICD-10-CM | POA: Diagnosis not present

## 2013-05-22 DIAGNOSIS — J3089 Other allergic rhinitis: Secondary | ICD-10-CM | POA: Diagnosis not present

## 2013-05-26 DIAGNOSIS — M79609 Pain in unspecified limb: Secondary | ICD-10-CM | POA: Diagnosis not present

## 2013-05-26 DIAGNOSIS — L501 Idiopathic urticaria: Secondary | ICD-10-CM | POA: Diagnosis not present

## 2013-05-29 DIAGNOSIS — L509 Urticaria, unspecified: Secondary | ICD-10-CM | POA: Diagnosis not present

## 2013-05-30 DIAGNOSIS — I10 Essential (primary) hypertension: Secondary | ICD-10-CM | POA: Diagnosis not present

## 2013-05-30 DIAGNOSIS — R55 Syncope and collapse: Secondary | ICD-10-CM | POA: Diagnosis not present

## 2013-05-30 DIAGNOSIS — R609 Edema, unspecified: Secondary | ICD-10-CM | POA: Diagnosis not present

## 2013-06-18 DIAGNOSIS — L508 Other urticaria: Secondary | ICD-10-CM | POA: Diagnosis not present

## 2013-07-08 DIAGNOSIS — L509 Urticaria, unspecified: Secondary | ICD-10-CM | POA: Diagnosis not present

## 2013-07-16 ENCOUNTER — Encounter: Payer: Self-pay | Admitting: Obstetrics & Gynecology

## 2013-07-17 ENCOUNTER — Encounter: Payer: Self-pay | Admitting: Obstetrics & Gynecology

## 2013-07-17 ENCOUNTER — Ambulatory Visit (INDEPENDENT_AMBULATORY_CARE_PROVIDER_SITE_OTHER): Payer: Medicare Other | Admitting: Obstetrics & Gynecology

## 2013-07-17 VITALS — BP 162/98 | HR 60 | Resp 20 | Ht 60.0 in | Wt 218.8 lb

## 2013-07-17 DIAGNOSIS — Z01419 Encounter for gynecological examination (general) (routine) without abnormal findings: Secondary | ICD-10-CM | POA: Diagnosis not present

## 2013-07-17 DIAGNOSIS — Z124 Encounter for screening for malignant neoplasm of cervix: Secondary | ICD-10-CM | POA: Diagnosis not present

## 2013-07-17 NOTE — Progress Notes (Signed)
72 y.o. G3P3 MarriedCaucasianF here for annual exam.  Having lots of trouble with swollen ankles.  Had full cardiac evaluation with Dr. Abe People.  All negative.  May end up seeing vascular specialist but doesn't want to go.  "tired" of doctors.    No vaginal bleeding since before last visit on 06/27/12.  Had syncopal episode and was evaluated in ER in [facility], [place].  Had echo, carotid dopplers, heart monitor testing.  All okay.    Feeling like she is having some trouble with "reaction time" from a response standpoint.  She really thinks this is due to all the anti-histamines.  Also having some BP issues.   Still has business.  "Almost retired".  Husband still driving but this is very scary for patient.  They drove to [place] earlier this year.  Husband insists on driving.  Patient's last menstrual period was 12/18/2000.          Sexually active: no  The current method of family planning is post menopausal status.    Exercising: yes  but not regularly Smoker:  no  Health Maintenance: Pap:  06/27/12 WNL History of abnormal Pap:  yes MMG:  08/02/12 normal Colonoscopy:  12/09 repeat in 5 years-aware is due this year, saw Dr. Ewing Schlein this week and he told her to wait another year. BMD:   6/09 normal -0.4/0.1 TDaP:  2005 Screening Labs: PCP, Hb today: PCP, Urine today: PCP   reports that she has never smoked. She has never used smokeless tobacco. She reports that she does not drink alcohol or use illicit drugs.  Past Medical History  Diagnosis Date  . Abnormal Pap smear 4/82    colpo/ecc negative  . Fibrocystic breast   . Uterine fibroid   . Cervical polyp 1/99  . Postmenopausal bleeding 3/99    on HRT/endometrial biopsy-focal simple hyperplasia  . Colon polyps   . Hypertension   . Elevated lipids   . Vitamin D deficiency   . Arthritis     knee  . Hives     undetermined-on 7 antihistamines    Past Surgical History  Procedure Laterality Date  . Tonsillectomy and adenoidectomy     as a child  . Incision and drainage      Bartholin cyst    Current Outpatient Prescriptions  Medication Sig Dispense Refill  . aspirin 81 MG tablet Take 81 mg by mouth daily.      . Biotin 1000 MCG tablet Take 1,000 mcg by mouth 3 (three) times daily.      . Cholecalciferol 4000 UNITS CAPS Take 4,000 Int'l Units by mouth daily.      Marland Kitchen doxepin (SINEQUAN) 10 MG capsule 10 mg. Two caps daily      . EPIPEN 2-PAK 0.3 MG/0.3ML SOAJ as needed.      . fexofenadine (ALLEGRA) 180 MG tablet Take 180 mg by mouth daily.      Marland Kitchen FLUOCINOLONE ACETONIDE BODY 0.01 % external oil as needed.      . furosemide (LASIX) 40 MG tablet as needed.      . hydrochlorothiazide (HYDRODIURIL) 25 MG tablet Take 25 mg by mouth daily.      . hydrOXYzine (ATARAX/VISTARIL) 25 MG tablet 25 mg daily.      Marland Kitchen levocetirizine (XYZAL) 5 MG tablet 5 mg 2 (two) times daily.      Marland Kitchen losartan (COZAAR) 100 MG tablet Take 100 mg by mouth daily.      . metoprolol succinate (TOPROL-XL) 50 MG  24 hr tablet Take 50 mg by mouth daily. Take with or immediately following a meal.      . montelukast (SINGULAIR) 10 MG tablet 10 mg daily.      . Omalizumab (XOLAIR [place]) Inject into the skin every 30 (thirty) days.      Marland Kitchen PROTOPIC 0.03 % ointment as needed.      . ranitidine (ZANTAC) 150 MG tablet 150 mg.      . simvastatin (ZOCOR) 80 MG tablet Take 80 mg by mouth at bedtime.      Marland Kitchen zolpidem (AMBIEN) 10 MG tablet Take 1 tablet (10 mg total) by mouth at bedtime as needed for sleep (1/2 to 1 PRN).  30 tablet  2   No current facility-administered medications for this visit.    Family History  Problem Relation Age of Onset  . Mental retardation Daughter   . Pancreatic cancer Mother   . Cancer Paternal Aunt     mouth cancer  . Heart attack Father   . Ulcers Father     ROS:  Pertinent items are noted in HPI.  Otherwise, a comprehensive ROS was negative.  Exam:   BP 162/98  Pulse 60  Resp 20  Ht 5' (1.524 m)  Wt 218 lb 12.8 oz (99.247 kg)   BMI 42.73 kg/m2  LMP 12/18/2000  Weight change: +8lbs  Height: 5' (152.4 cm)  Ht Readings from Last 3 Encounters:  07/17/13 5' (1.524 m)  05/14/08 5' (1.524 m)    General appearance: alert, cooperative and appears stated age Head: Normocephalic, without obvious abnormality, atraumatic Neck: no adenopathy, supple, symmetrical, trachea midline and thyroid normal to inspection and palpation Lungs: clear to auscultation bilaterally Breasts: normal appearance, no masses or tenderness Heart: regular rate and rhythm Abdomen: soft, non-tender; bowel sounds normal; no masses,  no organomegaly Extremities: extremities normal, atraumatic, no cyanosis or edema Skin: Skin color, texture, turgor normal. No rashes or lesions Lymph nodes: Cervical, supraclavicular, and axillary nodes normal. No abnormal inguinal nodes palpated Neurologic: Grossly normal   Pelvic: External genitalia:  no lesions              Urethra:  normal appearing urethra with no masses, tenderness or lesions              Bartholins and Skenes: normal                 Vagina: normal appearing vagina with normal color and discharge, no lesions              Cervix: no lesions              Pap taken: no Bimanual Exam:  Uterus:  normal size, contour, position, consistency, mobility, non-tender              Adnexa: normal adnexa and no mass, fullness, tenderness               Rectovaginal: Confirms               Anus:  normal sphincter tone, no lesions  A:  Well Woman with normal exam PMP, no HRT Hypertension Obesity Elevated lipids Bilateral knee arthritis Vitamin D deficiency  P:   Mammogram yearly pap smear 7/13 Uses ambien 5mg  qhs Seeing PCP every three months.  Regular labs done. return annually or prn  An After Visit Summary was printed and given to the patient.

## 2013-07-17 NOTE — Patient Instructions (Signed)

## 2013-08-06 ENCOUNTER — Other Ambulatory Visit: Payer: Self-pay | Admitting: *Deleted

## 2013-08-06 NOTE — Telephone Encounter (Signed)
Fax From: [employer] for Ambien 10 mg (Patient is requesting 3 months at a time.) Last Refilled: 04/21/13 #30/2  Last Aex: 07/17/13- (no rx was given) Aex scheduled for 10/02/14  Please Advise.

## 2013-08-07 MED ORDER — ZOLPIDEM TARTRATE 10 MG PO TABS: 10.0000 mg | ORAL_TABLET | Freq: Every evening | ORAL | Status: DC | PRN

## 2013-08-14 DIAGNOSIS — L501 Idiopathic urticaria: Secondary | ICD-10-CM | POA: Diagnosis not present

## 2013-09-11 DIAGNOSIS — L501 Idiopathic urticaria: Secondary | ICD-10-CM | POA: Diagnosis not present

## 2013-09-16 DIAGNOSIS — J3089 Other allergic rhinitis: Secondary | ICD-10-CM | POA: Diagnosis not present

## 2013-09-16 DIAGNOSIS — J301 Allergic rhinitis due to pollen: Secondary | ICD-10-CM | POA: Diagnosis not present

## 2013-09-16 DIAGNOSIS — L501 Idiopathic urticaria: Secondary | ICD-10-CM | POA: Diagnosis not present

## 2013-09-24 ENCOUNTER — Other Ambulatory Visit: Payer: Self-pay

## 2013-09-24 DIAGNOSIS — Z1231 Encounter for screening mammogram for malignant neoplasm of breast: Secondary | ICD-10-CM

## 2013-09-30 DIAGNOSIS — H1044 Vernal conjunctivitis: Secondary | ICD-10-CM | POA: Diagnosis not present

## 2013-09-30 DIAGNOSIS — H2510 Age-related nuclear cataract, unspecified eye: Secondary | ICD-10-CM | POA: Diagnosis not present

## 2013-10-24 ENCOUNTER — Ambulatory Visit: Payer: Medicare Other

## 2013-10-27 ENCOUNTER — Ambulatory Visit
Admission: RE | Admit: 2013-10-27 | Discharge: 2013-10-27 | Disposition: A | Discharge status: Home / Self Care | Payer: Medicare Other | Source: Ambulatory Visit

## 2013-10-27 DIAGNOSIS — Z1231 Encounter for screening mammogram for malignant neoplasm of breast: Secondary | ICD-10-CM | POA: Diagnosis not present

## 2013-10-28 DIAGNOSIS — J209 Acute bronchitis, unspecified: Secondary | ICD-10-CM | POA: Diagnosis not present

## 2013-11-03 DIAGNOSIS — J40 Bronchitis, not specified as acute or chronic: Secondary | ICD-10-CM | POA: Diagnosis not present

## 2013-11-03 DIAGNOSIS — R05 Cough: Secondary | ICD-10-CM | POA: Diagnosis not present

## 2013-11-03 DIAGNOSIS — R059 Cough, unspecified: Secondary | ICD-10-CM | POA: Diagnosis not present

## 2013-11-24 DIAGNOSIS — L501 Idiopathic urticaria: Secondary | ICD-10-CM | POA: Diagnosis not present

## 2013-11-29 DIAGNOSIS — Z23 Encounter for immunization: Secondary | ICD-10-CM | POA: Diagnosis not present

## 2013-12-25 DIAGNOSIS — L501 Idiopathic urticaria: Secondary | ICD-10-CM | POA: Diagnosis not present

## 2014-01-22 ENCOUNTER — Encounter: Payer: Self-pay | Admitting: Vascular Surgery

## 2014-01-22 ENCOUNTER — Other Ambulatory Visit: Payer: Self-pay

## 2014-01-22 DIAGNOSIS — M7989 Other specified soft tissue disorders: Secondary | ICD-10-CM

## 2014-02-17 ENCOUNTER — Encounter: Payer: Self-pay | Admitting: Vascular Surgery

## 2014-02-18 ENCOUNTER — Ambulatory Visit (INDEPENDENT_AMBULATORY_CARE_PROVIDER_SITE_OTHER): Payer: Medicare Other | Admitting: Vascular Surgery

## 2014-02-18 ENCOUNTER — Encounter: Payer: Self-pay | Admitting: Vascular Surgery

## 2014-02-18 ENCOUNTER — Ambulatory Visit (HOSPITAL_COMMUNITY)
Admission: RE | Admit: 2014-02-18 | Discharge: 2014-02-18 | Disposition: A | Discharge status: Home / Self Care | Payer: Medicare Other | Source: Ambulatory Visit | Attending: Vascular Surgery | Admitting: Vascular Surgery

## 2014-02-18 VITALS — BP 196/71 | HR 63 | Ht 60.0 in | Wt 213.4 lb

## 2014-02-18 DIAGNOSIS — I890 Lymphedema, not elsewhere classified: Secondary | ICD-10-CM | POA: Diagnosis not present

## 2014-02-18 DIAGNOSIS — M7989 Other specified soft tissue disorders: Secondary | ICD-10-CM

## 2014-02-18 NOTE — Assessment & Plan Note (Signed)
I believe that her swelling is related to lymphedema. She has no evidence of significant chronic venous insufficiency. We have discussed the importance of intermittent leg elevation. In addition I have written her a prescription for compression stockings with a gradient of 20-30 mm of mercury. We have discussed the potential use of water aerobics which I think is also helpful. I've encouraged her to avoid prolonged sitting and standing. I be happy to see her back at any time if her swelling increases. The only other consideration would be a CT scan of the abdomen and pelvis to look for evidence of lymphatic obstruction however I think the yield for this would be extremely low.

## 2014-02-18 NOTE — Progress Notes (Signed)
[facility]  Patient name: Alexandra Williams MRN: [id number] DOB: 1941-11-13 Sex: female  REASON FOR CONSULT: Bilateral lower extremity swelling  HPI: Alexandra Williams is a 73 y.o. female who noted the gradual onset of bilateral lower extremity swelling approximately a year ago. She was working at [employer] and was standing on her feet quite a bit. She's had gradually increasing swelling of both lower extremities. Her symptoms are aggravated by standing. She does elevate her legs intermittently. She has not had much success wearing compression stockings. She is unaware of any previous history of DVT. She denies any abdominal surgery or surgery on her groins. She denies any radiation therapy to her abdomen or groins.  Her swelling is essentially asymptomatic.   Past Medical History  Diagnosis Date  . Abnormal Pap smear 4/82    colpo/ecc negative  . Fibrocystic breast   . Uterine fibroid   . Cervical polyp 1/99  . Postmenopausal bleeding 3/99    on HRT/endometrial biopsy-focal simple hyperplasia  . Colon polyps   . Hypertension   . Elevated lipids   . Vitamin D deficiency   . Arthritis     knee  . Hives     undetermined-on 7 antihistamines  . Hyperlipidemia    Family History  Problem Relation Age of Onset  . Mental retardation Daughter   . Pancreatic cancer Mother   . Cancer Mother   . Diabetes Mother   . Hyperlipidemia Mother   . Hypertension Mother   . Cancer Paternal Aunt     mouth cancer  . Heart attack Father   . Ulcers Father   . Heart disease Father   . Kidney disease Father   . Cancer Father   . Hypertension Father    SOCIAL HISTORY: History  Substance Use Topics  . Smoking status: Never Smoker   . Smokeless tobacco: Never Used  . Alcohol Use: No   Allergies  Allergen Reactions  . Codeine   . Peanuts [Peanut Oil]   . Shellfish Allergy   . Sulfites     preservatives  . Tessalon [Benzonatate] Itching    Current Outpatient Prescriptions  Medication Sig Dispense Refill  . aspirin 81 MG tablet Take 81 mg by mouth daily.      Marland Kitchen doxepin (SINEQUAN) 10 MG capsule 10 mg. Two caps daily      . hydrochlorothiazide (HYDRODIURIL) 25 MG tablet Take 25 mg by mouth daily.      . hydrOXYzine (ATARAX/VISTARIL) 25 MG tablet 10 mg daily.       Marland Kitchen levocetirizine (XYZAL) 5 MG tablet 5 mg 2 (two) times daily.      Marland Kitchen losartan (COZAAR) 100 MG tablet Take 100 mg by mouth daily.      . metoprolol succinate (TOPROL-XL) 50 MG 24 hr tablet Take 50 mg by mouth daily. Take with or immediately following a meal.      . montelukast (SINGULAIR) 10 MG tablet 10 mg daily.      . Omalizumab (XOLAIR [place]) Inject into the skin every 30 (thirty) days.      . ranitidine (ZANTAC) 150 MG tablet 150 mg.      . simvastatin (ZOCOR) 80 MG tablet Take 80 mg by mouth at bedtime.      Marland Kitchen zolpidem (AMBIEN) 10 MG tablet Take 5 mg by mouth at bedtime as needed for sleep (1/2 to 1 PRN).      . Biotin 1000 MCG tablet Take 1,000  mcg by mouth 3 (three) times daily.      . Cholecalciferol 4000 UNITS CAPS Take 4,000 Int'l Units by mouth daily.      Marland Kitchen EPIPEN 2-PAK 0.3 MG/0.3ML SOAJ as needed.      . fexofenadine (ALLEGRA) 180 MG tablet Take 180 mg by mouth daily.      Marland Kitchen FLUOCINOLONE ACETONIDE BODY 0.01 % external oil as needed.      . furosemide (LASIX) 40 MG tablet as needed.      Marland Kitchen PROTOPIC 0.03 % ointment as needed.       No current facility-administered medications for this visit.   REVIEW OF SYSTEMS: Valu.Nieves ] denotes positive finding; [  ] denotes negative finding  CARDIOVASCULAR:  [ ]  chest pain   [ ]  chest pressure   [ ]  palpitations   [ ]  orthopnea   [ ]  dyspnea on exertion   [ ]  claudication   [ ]  rest pain   [ ]  DVT   [ ]  phlebitis PULMONARY:   [ ]  productive cough   [ ]  asthma   [ ]  wheezing NEUROLOGIC:   [ ]  weakness  [ ]  paresthesias  [ ]  aphasia  [ ]  amaurosis  [ ]  dizziness HEMATOLOGIC:   [ ]  bleeding problems   [ ]  clotting  disorders MUSCULOSKELETAL:  [ ]  joint pain   [ ]  joint swelling Valu.Nieves ] leg swelling GASTROINTESTINAL: [ ]   blood in stool  [ ]   hematemesis GENITOURINARY:  [ ]   dysuria  [ ]   hematuria PSYCHIATRIC:  [ ]  history of major depression INTEGUMENTARY:  Valu.Nieves ] rashes  [ ]  ulcers CONSTITUTIONAL:  [ ]  fever   [ ]  chills  PHYSICAL EXAM: Filed Vitals:   02/18/14 1337  BP: 196/71  Pulse: 63  Height: 5' (1.524 m)  Weight: 213 lb 6.4 oz (96.798 kg)  SpO2: 100%   Body mass index is 41.68 kg/(m^2). GENERAL: The patient is a well-nourished female, in no acute distress. The vital signs are documented above. CARDIOVASCULAR: There is a regular rate and rhythm. I do not detect carotid bruits. She has palpable dorsalis pedis pulses. She has bilateral lower extremity swelling which is nonpitting. PULMONARY: There is good air exchange bilaterally without wheezing or rales. ABDOMEN: Soft and non-tender with normal pitched bowel sounds.  MUSCULOSKELETAL: There are no major deformities or cyanosis. NEUROLOGIC: No focal weakness or paresthesias are detected. SKIN: There are no ulcers or rashes noted. PSYCHIATRIC: The patient has a normal affect.  DATA:  I have independently interpreted her venous duplex scan. On the right side there is no evidence of DVT and no evidence of deep vein reflux. There is reflux at the saphenofemoral junction but no reflux in the right greater saphenous vein. On the left side, there is no evidence of DVT or deep vein reflux. There is no reflux at the saphenofemoral junction or the left greater saphenous vein.  MEDICAL ISSUES:  Lymphedema I believe that her swelling is related to lymphedema. She has no evidence of significant chronic venous insufficiency. We have discussed the importance of intermittent leg elevation. In addition I have written her a prescription for compression stockings with a gradient of 20-30 mm of mercury. We have discussed the potential use of water aerobics which I  think is also helpful. I've encouraged her to avoid prolonged sitting and standing. I be happy to see her back at any time if her swelling increases. The only other consideration would be a CT  scan of the abdomen and pelvis to look for evidence of lymphatic obstruction however I think the yield for this would be extremely low.   [place] [facility] Beeper: [phone]

## 2014-02-19 DIAGNOSIS — L501 Idiopathic urticaria: Secondary | ICD-10-CM | POA: Diagnosis not present

## 2014-02-21 IMAGING — US US CAROTID DUPLEX BILAT
1 series · 13 of 24 positions shown · non-contrast
Comparison: None.

CLINICAL DATA: Syncopal episode, lightheadedness, history of
hypertension

BILATERAL CAROTID DUPLEX ULTRASOUND
TECHNIQUE: Gray scale imaging, color Doppler and duplex ultrasound
was performed of bilateral carotid and vertebral arteries in the
neck.

[Series 1: us carotid duplex bilat · 0.06mm/px · 13 of 69 slices shown]
[im 1/69]
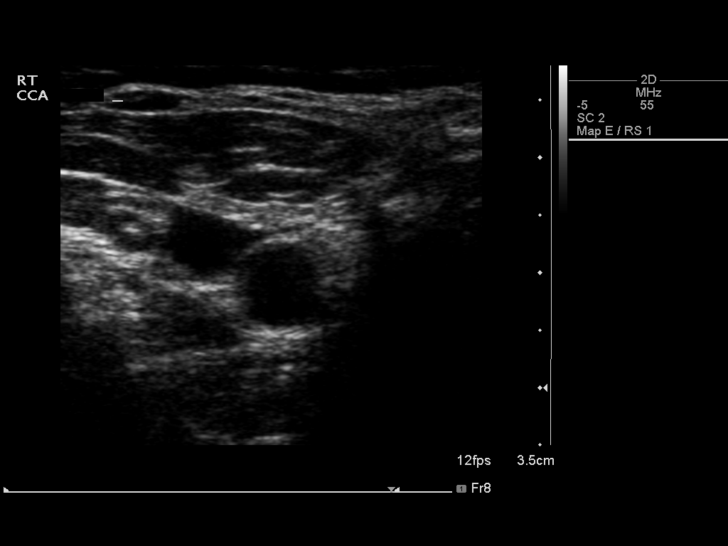
[im 6/69]
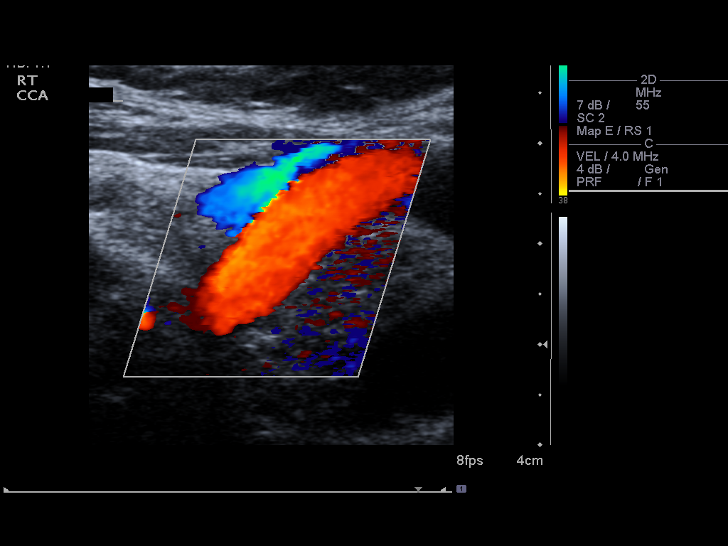
[im 12/69]
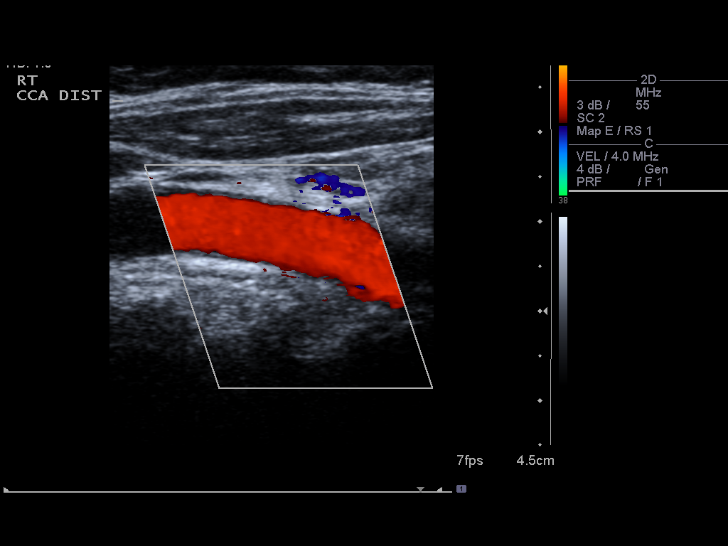
[im 18/69]
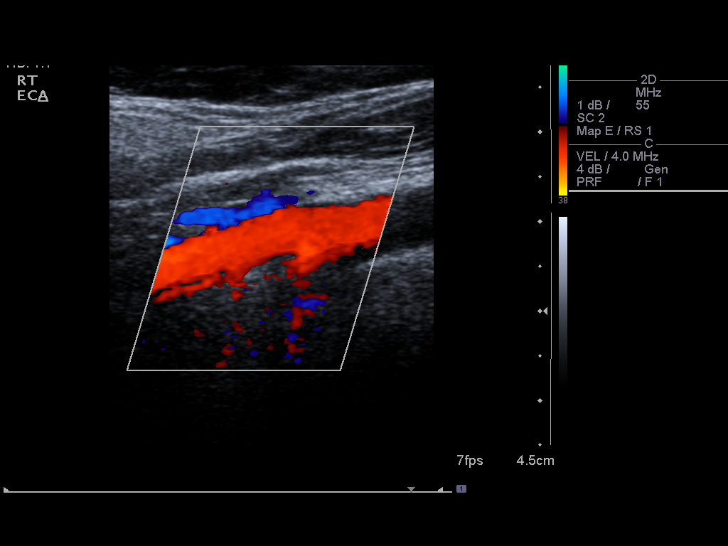
[im 24/69]
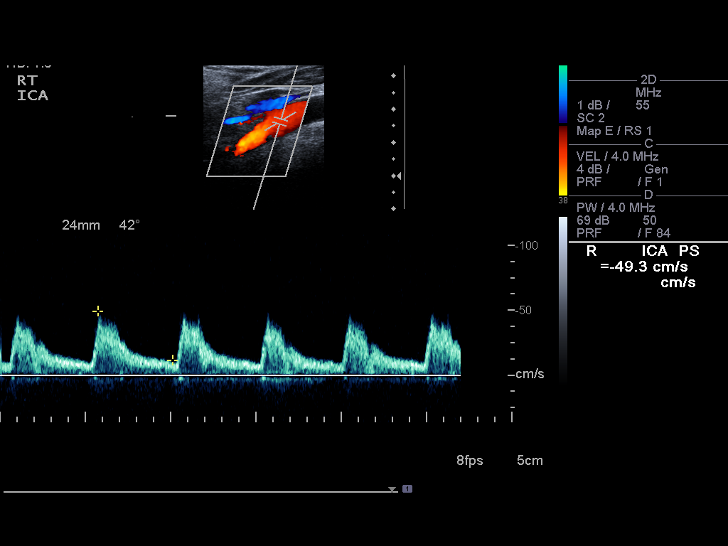
[im 30/69]
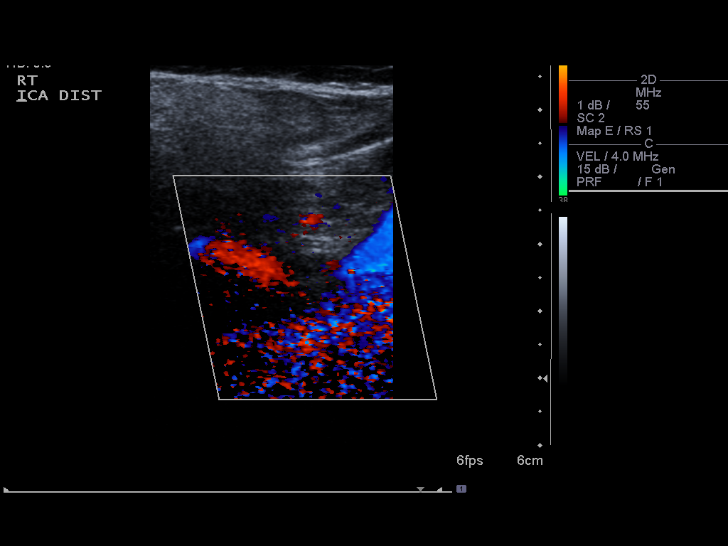
[im 36/69]
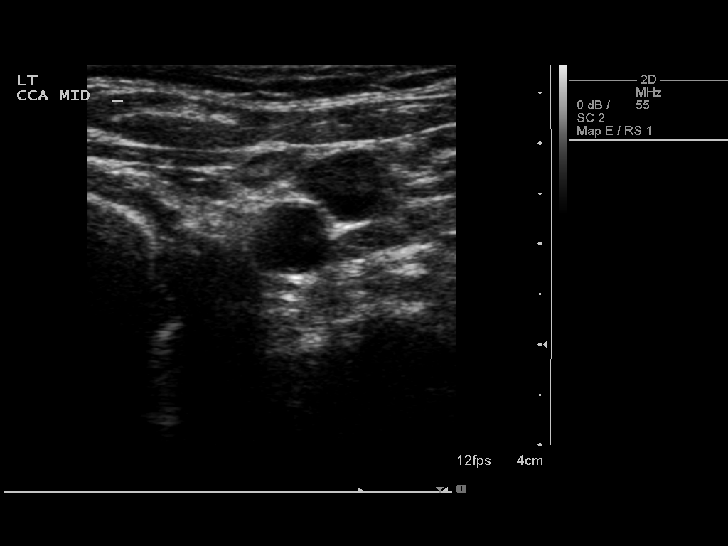
[im 39/69]
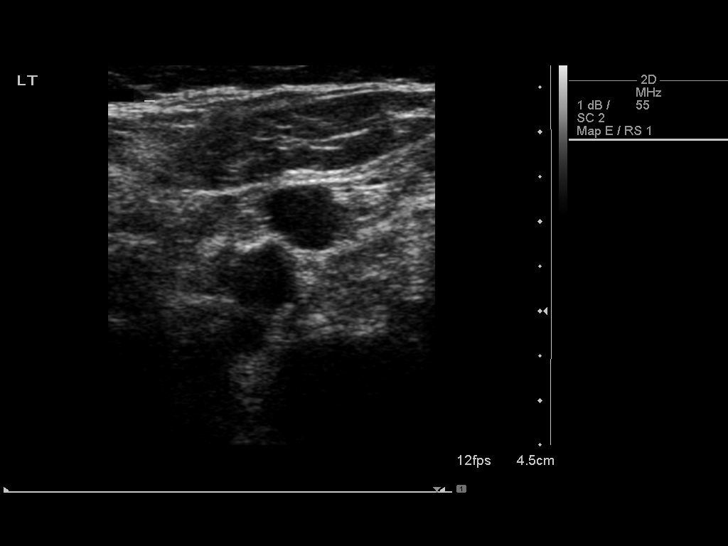
[im 45/69]
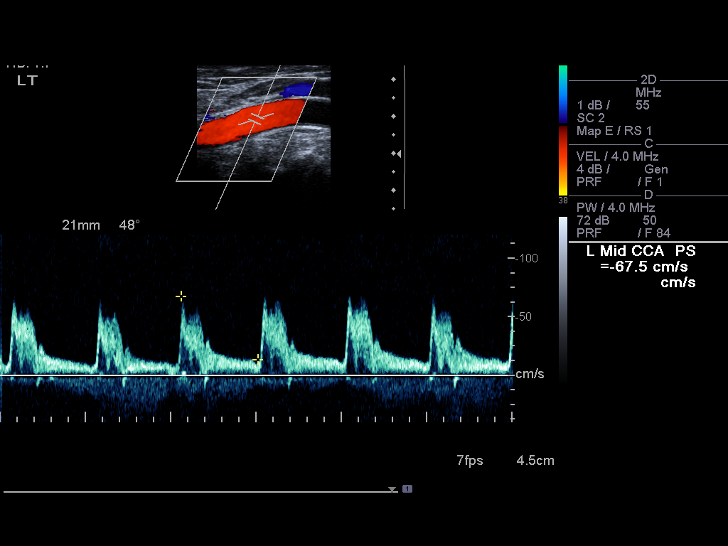
[im 51/69]
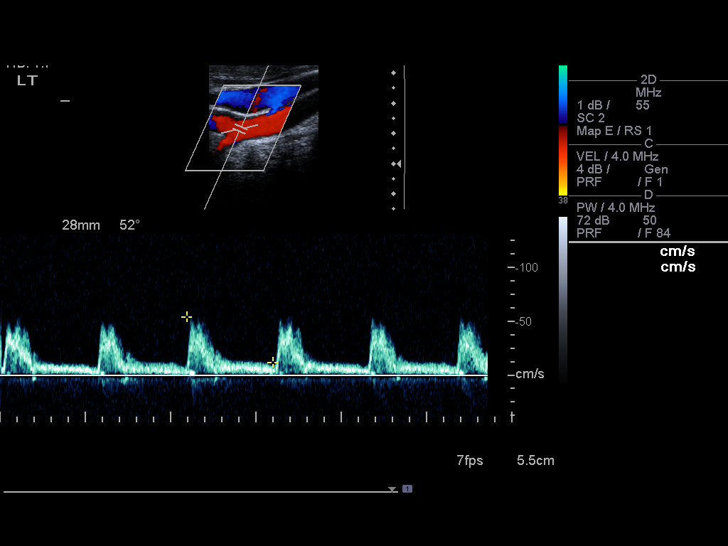
[im 57/69]
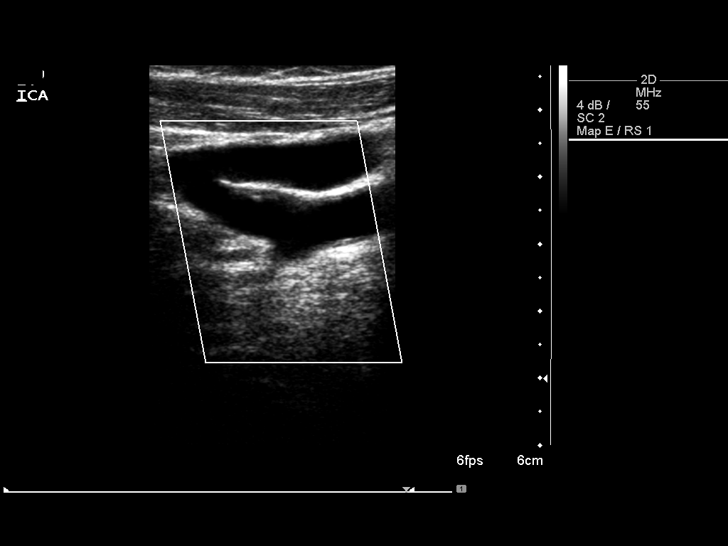
[im 63/69]
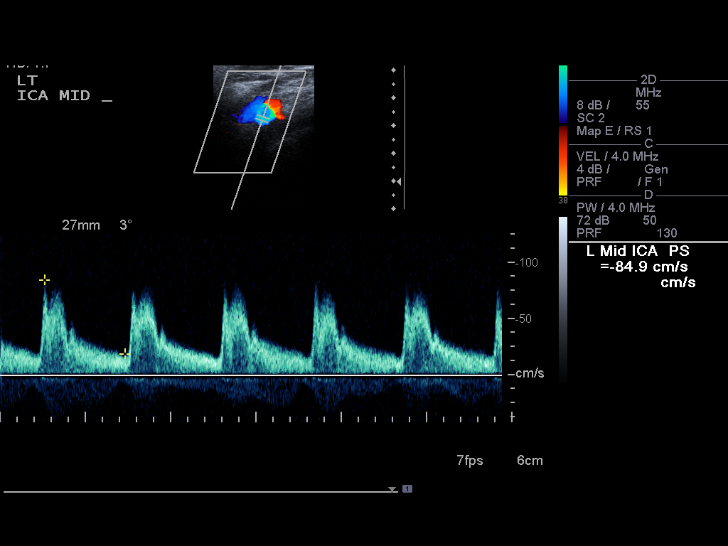
[im 69/69]
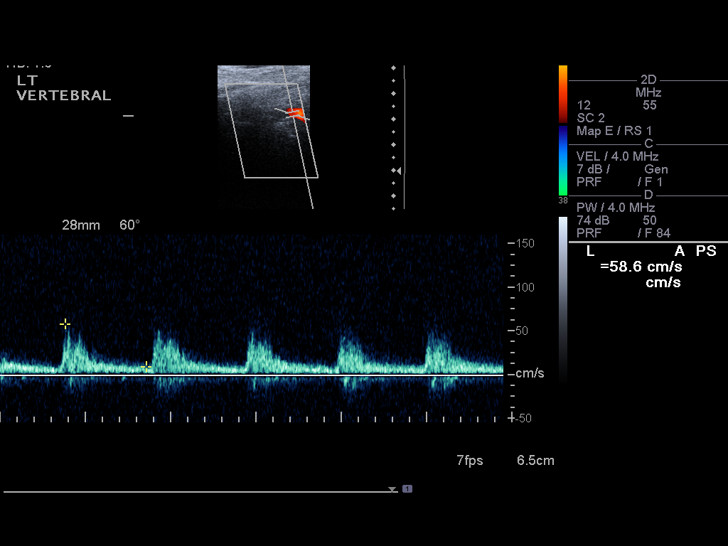

[13 of 24 positions shown; findings below may reference images not displayed]

Criteria:  Quantification of carotid stenosis is based on velocity
parameters that correlate the residual internal carotid diameter
with NASCET-based stenosis levels, using the diameter of the distal
internal carotid lumen as the denominator for stenosis measurement.

The following velocity measurements were obtained:

                 PEAK SYSTOLIC/END DIASTOLIC
RIGHT
ICA:                        81/18cm/sec
CCA:                        76/16cm/sec
SYSTOLIC ICA/CCA RATIO:
DIASTOLIC ICA/CCA RATIO:
ECA:                        75cm/sec

LEFT
ICA:                        94/22cm/sec
CCA:                        90/12cm/sec
SYSTOLIC ICA/CCA RATIO:
DIASTOLIC ICA/CCA RATIO:
ECA:                        60cm/sec
FINDINGS: RIGHT CAROTID ARTERY: There is mild tortuosity of the right common
carotid artery.  There is a very minimal amount of eccentric
echogenic plaque within the right carotid bulb (image 18) and
origin of the right internal carotid artery (image 25), not
resulting in hemodynamically significant stenosis within the right
internal carotid artery.

RIGHT VERTEBRAL ARTERY:  Antegrade flow

LEFT CAROTID ARTERY: There is very minimal amount of intimal wall
thickening within the left carotid bulb (image 52) extending to
involve the origin and proximal aspect of the left internal carotid
artery (image 60), not resulting elevated peak systolic velocity
within the left internal carotid artery to suggest a
hemodynamically significant stenosis.

LEFT VERTEBRAL ARTERY:  Antegrade flow
IMPRESSION: Minimal amount of intimal wall thickening and atherosclerotic
plaque bilaterally, right subjectively greater than left, not
resulting in hemodynamically significant stenosis.

## 2014-02-23 ENCOUNTER — Other Ambulatory Visit: Payer: Self-pay | Admitting: *Deleted

## 2014-02-23 MED ORDER — ZOLPIDEM TARTRATE 10 MG PO TABS: 5.0000 mg | ORAL_TABLET | Freq: Every evening | ORAL | Status: DC | PRN

## 2014-02-23 NOTE — Telephone Encounter (Signed)
Incoming fax requesting Ambien refill.  Last AEX 07/17/2013 Last refill 08/07/2013 #30/2 refills Next appt 10/02/2014  Please approve or deny rx.

## 2014-03-18 DIAGNOSIS — L501 Idiopathic urticaria: Secondary | ICD-10-CM | POA: Diagnosis not present

## 2014-04-01 ENCOUNTER — Other Ambulatory Visit: Payer: Self-pay | Admitting: Obstetrics and Gynecology

## 2014-04-01 NOTE — Telephone Encounter (Signed)
Last aex was 07-17-13. Last given 02-23-14 #30 with 0 refills. Please approve or deny rx

## 2014-04-15 DIAGNOSIS — J3089 Other allergic rhinitis: Secondary | ICD-10-CM | POA: Diagnosis not present

## 2014-04-15 DIAGNOSIS — L501 Idiopathic urticaria: Secondary | ICD-10-CM | POA: Diagnosis not present

## 2014-04-15 DIAGNOSIS — J301 Allergic rhinitis due to pollen: Secondary | ICD-10-CM | POA: Diagnosis not present

## 2014-04-22 DIAGNOSIS — Z0000 Encounter for general adult medical examination without abnormal findings: Secondary | ICD-10-CM | POA: Diagnosis not present

## 2014-04-22 DIAGNOSIS — L508 Other urticaria: Secondary | ICD-10-CM | POA: Diagnosis not present

## 2014-04-22 DIAGNOSIS — E7800 Pure hypercholesterolemia, unspecified: Secondary | ICD-10-CM | POA: Diagnosis not present

## 2014-04-22 DIAGNOSIS — Z23 Encounter for immunization: Secondary | ICD-10-CM | POA: Diagnosis not present

## 2014-04-22 DIAGNOSIS — I10 Essential (primary) hypertension: Secondary | ICD-10-CM | POA: Diagnosis not present

## 2014-05-02 DIAGNOSIS — J019 Acute sinusitis, unspecified: Secondary | ICD-10-CM | POA: Diagnosis not present

## 2014-05-08 ENCOUNTER — Other Ambulatory Visit: Payer: Self-pay | Admitting: Gastroenterology

## 2014-05-08 DIAGNOSIS — D126 Benign neoplasm of colon, unspecified: Secondary | ICD-10-CM | POA: Diagnosis not present

## 2014-05-08 DIAGNOSIS — L918 Other hypertrophic disorders of the skin: Secondary | ICD-10-CM | POA: Diagnosis not present

## 2014-05-08 DIAGNOSIS — Z09 Encounter for follow-up examination after completed treatment for conditions other than malignant neoplasm: Secondary | ICD-10-CM | POA: Diagnosis not present

## 2014-05-08 DIAGNOSIS — Z86010 Personal history of colonic polyps: Secondary | ICD-10-CM | POA: Diagnosis not present

## 2014-05-29 DIAGNOSIS — L501 Idiopathic urticaria: Secondary | ICD-10-CM | POA: Diagnosis not present

## 2014-06-18 ENCOUNTER — Other Ambulatory Visit: Payer: Self-pay | Admitting: Obstetrics and Gynecology

## 2014-06-22 NOTE — Telephone Encounter (Signed)
Last refilled: 04/01/14 #30/0 refills Last AEX: 07/17/13  AEX Scheduled: 10/02/14 with Dr. Sabra Heck  Please Advise.

## 2014-06-25 ENCOUNTER — Other Ambulatory Visit: Payer: Self-pay | Admitting: Obstetrics and Gynecology

## 2014-06-26 NOTE — Telephone Encounter (Signed)
Dr. Sabra Heck printed the rx. Alexandra Williams is faxing the rx to the pharmacy.   Encounter closed

## 2014-06-29 ENCOUNTER — Telehealth: Payer: Self-pay | Admitting: Obstetrics & Gynecology

## 2014-06-29 NOTE — Telephone Encounter (Signed)
Called [facility] pharmacy and called in Ambien 10 mg #30/0 refills  LM on patient's VM that rx has been called in.

## 2014-06-29 NOTE — Telephone Encounter (Addendum)
Patient states pharmacy did not receive rx refill request

## 2014-06-29 NOTE — Telephone Encounter (Signed)
Pharmacy is calling saying they didn't not receive manual fax for ambien please refax to [phone]

## 2014-07-28 DIAGNOSIS — L501 Idiopathic urticaria: Secondary | ICD-10-CM | POA: Diagnosis not present

## 2014-08-04 DIAGNOSIS — J301 Allergic rhinitis due to pollen: Secondary | ICD-10-CM | POA: Diagnosis not present

## 2014-08-04 DIAGNOSIS — J3089 Other allergic rhinitis: Secondary | ICD-10-CM | POA: Diagnosis not present

## 2014-08-04 DIAGNOSIS — L501 Idiopathic urticaria: Secondary | ICD-10-CM | POA: Diagnosis not present

## 2014-08-04 DIAGNOSIS — T783XXA Angioneurotic edema, initial encounter: Secondary | ICD-10-CM | POA: Diagnosis not present

## 2014-08-17 ENCOUNTER — Other Ambulatory Visit: Payer: Self-pay

## 2014-08-17 NOTE — Telephone Encounter (Signed)
Last AEX: 07/17/13 Last refill:06/23/14 #30 Current AEX:10/02/14  Please advise

## 2014-08-20 MED ORDER — ZOLPIDEM TARTRATE 10 MG PO TABS: ORAL_TABLET | ORAL | Status: DC

## 2014-08-20 NOTE — Telephone Encounter (Signed)
Already signed off.  Just needs to be faxed.

## 2014-08-20 NOTE — Telephone Encounter (Signed)
Any answer on this RX?

## 2014-08-21 NOTE — Telephone Encounter (Signed)
Faxed to pharmacy

## 2014-09-03 DIAGNOSIS — L501 Idiopathic urticaria: Secondary | ICD-10-CM | POA: Diagnosis not present

## 2014-09-23 DIAGNOSIS — L501 Idiopathic urticaria: Secondary | ICD-10-CM | POA: Diagnosis not present

## 2014-09-23 DIAGNOSIS — E780 Pure hypercholesterolemia: Secondary | ICD-10-CM | POA: Diagnosis not present

## 2014-09-23 DIAGNOSIS — I10 Essential (primary) hypertension: Secondary | ICD-10-CM | POA: Diagnosis not present

## 2014-09-29 DIAGNOSIS — I10 Essential (primary) hypertension: Secondary | ICD-10-CM | POA: Insufficient documentation

## 2014-10-02 ENCOUNTER — Encounter: Payer: Self-pay | Admitting: Obstetrics & Gynecology

## 2014-10-02 ENCOUNTER — Ambulatory Visit: Payer: Medicare Other | Admitting: Obstetrics & Gynecology

## 2014-10-06 DIAGNOSIS — Z23 Encounter for immunization: Secondary | ICD-10-CM | POA: Diagnosis not present

## 2014-10-08 ENCOUNTER — Ambulatory Visit (INDEPENDENT_AMBULATORY_CARE_PROVIDER_SITE_OTHER): Payer: Medicare Other | Admitting: Nurse Practitioner

## 2014-10-08 ENCOUNTER — Encounter: Payer: Self-pay | Admitting: Nurse Practitioner

## 2014-10-08 VITALS — BP 160/104 | HR 64 | Ht 60.0 in | Wt 226.0 lb

## 2014-10-08 DIAGNOSIS — I776 Arteritis, unspecified: Secondary | ICD-10-CM | POA: Diagnosis not present

## 2014-10-08 DIAGNOSIS — Z01419 Encounter for gynecological examination (general) (routine) without abnormal findings: Secondary | ICD-10-CM | POA: Diagnosis not present

## 2014-10-08 DIAGNOSIS — Z124 Encounter for screening for malignant neoplasm of cervix: Secondary | ICD-10-CM | POA: Diagnosis not present

## 2014-10-08 DIAGNOSIS — I10 Essential (primary) hypertension: Secondary | ICD-10-CM

## 2014-10-08 NOTE — Patient Instructions (Signed)

## 2014-10-08 NOTE — Progress Notes (Signed)
Patient ID: Alexandra Williams, female   DOB: 16-Mar-1941, 73 y.o.   MRN: [id number] 73 y.o. G3P3 Married Caucasian Fe here for annual exam.  Last week had apt with Dr. Sabra Heck.  Daughter who has autism had to go for MRI with history of uterine fibroids and ovarian cyst.  Husband with recurrent prostate cancer and  dialysis  had to have hormone injection last week as well.  Last few months increase in hot flashes.  She realizes this is in relationship to increase in stressors.  Also severe problems with urtica and went to [facility] last week.    Patient's last menstrual period was 12/18/2000.          Sexually active: No.  The current method of family planning is post menopausal status.    Exercising: No.  The patient does not participate in regular exercise at present. Smoker:  no  Health Maintenance: Pap:  06/27/12, WNL MMG:  10/27/13, Bi-Rads 1:  Negative  Colonoscopy:  05/08/14, polyp, repeat in 5 years BMD:   6/09 normal -0.4/0.1 TDaP:  2005  Labs: PCP   reports that she has never smoked. She has never used smokeless tobacco. She reports that she does not drink alcohol or use illicit drugs.  Past Medical History  Diagnosis Date  . Abnormal Pap smear 4/82    colpo/ecc negative  . Fibrocystic breast   . Uterine fibroid   . Cervical polyp 1/99  . Postmenopausal bleeding 3/99    on HRT/endometrial biopsy-focal simple hyperplasia  . Colon polyps   . Hypertension   . Elevated lipids   . Vitamin D deficiency   . Arthritis     knee  . Hives     undetermined-on 7 antihistamines  . Hyperlipidemia   . Vasculitis     bilat legs    Past Surgical History  Procedure Laterality Date  . Tonsillectomy and adenoidectomy      as a child  . Incision and drainage      Bartholin cyst    Current Outpatient Prescriptions  Medication Sig Dispense Refill  . acetaminophen (RA ACETAMINOPHEN) 650 MG CR tablet Take 650 mg by mouth as needed.      Marland Kitchen aspirin 81 MG tablet Take 81 mg by mouth daily.       . Biotin 1000 MCG tablet Take 1,000 mcg by mouth 3 (three) times daily.      Marland Kitchen doxepin (SINEQUAN) 10 MG capsule 10 mg. Two caps daily      . EPIPEN 2-PAK 0.3 MG/0.3ML SOAJ as needed.      . fexofenadine (ALLEGRA) 180 MG tablet Take 180 mg by mouth daily.      Marland Kitchen FLUOCINOLONE ACETONIDE BODY 0.01 % external oil as needed.      . hydrOXYzine (ATARAX/VISTARIL) 25 MG tablet 10 mg daily.       Marland Kitchen levocetirizine (XYZAL) 5 MG tablet 5 mg 2 (two) times daily.      Marland Kitchen losartan (COZAAR) 100 MG tablet Take 100 mg by mouth daily.      . metoprolol succinate (TOPROL-XL) 50 MG 24 hr tablet Take 50 mg by mouth daily. Take with or immediately following a meal.      . montelukast (SINGULAIR) 10 MG tablet 10 mg daily.      . Omalizumab (XOLAIR [place]) Inject into the skin every 30 (thirty) days.      Marland Kitchen PROTOPIC 0.03 % ointment as needed.      . ranitidine (ZANTAC) 150 MG  tablet 150 mg.      . simvastatin (ZOCOR) 80 MG tablet Take 80 mg by mouth at bedtime.      Marland Kitchen zolpidem (AMBIEN) 10 MG tablet TAKE 1/2 TO 1 TABLET BY MOUTH AT BEDTIME AS NEEDED FOR SLEEP  30 tablet  0   No current facility-administered medications for this visit.    Family History  Problem Relation Age of Onset  . Mental retardation Daughter   . Pancreatic cancer Mother   . Cancer Mother   . Diabetes Mother   . Hyperlipidemia Mother   . Hypertension Mother   . Cancer Paternal Aunt     mouth cancer  . Heart attack Father   . Ulcers Father   . Heart disease Father   . Kidney disease Father   . Cancer Father   . Hypertension Father     ROS:  Pertinent items are noted in HPI.  Otherwise, a comprehensive ROS was negative.  Exam:   BP 160/104  Pulse 64  Ht 5' (1.524 m)  Wt 226 lb (102.513 kg)  BMI 44.14 kg/m2  LMP 12/18/2000 Height: 5' (152.4 cm)  Ht Readings from Last 3 Encounters:  10/08/14 5' (1.524 m)  02/18/14 5' (1.524 m)  07/17/13 5' (1.524 m)   Uses a can for ambulation for right knee. General appearance: alert,  cooperative and appears stated age Head: Normocephalic, without obvious abnormality, atraumatic Neck: no adenopathy, supple, symmetrical, trachea midline and thyroid normal to inspection and palpation Lungs: clear to auscultation bilaterally Breasts: normal appearance, no masses or tenderness, dense Heart: regular rate and rhythm Abdomen: soft, non-tender; no masses,  no organomegaly Extremities: extremities normal, atraumatic, no cyanosis or edema, vasculitis both lower extremities Skin: Skin color, texture, turgor normal. No rashes or lesions, several hives on trunk and legs. Lymph nodes: Cervical, supraclavicular, and axillary nodes normal. No abnormal inguinal nodes palpated Neurologic: Grossly normal   Pelvic: External genitalia:  no lesions              Urethra:  normal appearing urethra with no masses, tenderness or lesions              Bartholin's and Skene's: normal                 Vagina: normal appearing vagina with normal color and discharge, no lesions              Cervix: anteverted              Pap taken: No. Bimanual Exam:  Uterus:  normal size, contour, position, consistency, mobility, non-tender              Adnexa: no mass, fullness, tenderness               Rectovaginal: Confirms               Anus:  normal sphincter tone, no lesions  A:  Well Woman with normal exam  Postmenopausal no HRT   History of Hypertension , Obesity, Elevated lipids, vasculitis   Bilateral knee arthritis - use of cane  Vitamin D deficiency  P:  Reviewed health and wellness pertinent to exam   Pap smear not taken today  She is going to follow with specialist about her BP elevation  Mammogram is due 11/15 - needs 3 D  Counseled on breast self exam, mammography screening, adequate intake of calcium and vitamin D, diet and exercise, Kegel's exercises return annually or prn  An After  Visit Summary was printed and given to the patient.

## 2014-10-09 NOTE — Progress Notes (Signed)
Reviewed personally.  M. Suzanne Zimri Brennen, MD.  

## 2014-10-19 ENCOUNTER — Encounter: Payer: Self-pay | Admitting: Nurse Practitioner

## 2014-10-23 ENCOUNTER — Other Ambulatory Visit: Payer: Self-pay

## 2014-10-23 DIAGNOSIS — Z1231 Encounter for screening mammogram for malignant neoplasm of breast: Secondary | ICD-10-CM

## 2014-10-27 DIAGNOSIS — E782 Mixed hyperlipidemia: Secondary | ICD-10-CM | POA: Diagnosis not present

## 2014-10-27 DIAGNOSIS — I10 Essential (primary) hypertension: Secondary | ICD-10-CM | POA: Diagnosis not present

## 2014-10-27 DIAGNOSIS — Z658 Other specified problems related to psychosocial circumstances: Secondary | ICD-10-CM | POA: Diagnosis not present

## 2014-11-11 DIAGNOSIS — M1711 Unilateral primary osteoarthritis, right knee: Secondary | ICD-10-CM | POA: Diagnosis not present

## 2014-11-11 DIAGNOSIS — M1712 Unilateral primary osteoarthritis, left knee: Secondary | ICD-10-CM | POA: Diagnosis not present

## 2014-11-11 DIAGNOSIS — M170 Bilateral primary osteoarthritis of knee: Secondary | ICD-10-CM | POA: Diagnosis not present

## 2014-11-16 DIAGNOSIS — T783XXA Angioneurotic edema, initial encounter: Secondary | ICD-10-CM | POA: Diagnosis not present

## 2014-11-16 DIAGNOSIS — J301 Allergic rhinitis due to pollen: Secondary | ICD-10-CM | POA: Diagnosis not present

## 2014-11-16 DIAGNOSIS — L501 Idiopathic urticaria: Secondary | ICD-10-CM | POA: Diagnosis not present

## 2014-11-16 DIAGNOSIS — J3089 Other allergic rhinitis: Secondary | ICD-10-CM | POA: Diagnosis not present

## 2014-11-25 ENCOUNTER — Other Ambulatory Visit: Payer: Self-pay

## 2014-11-25 ENCOUNTER — Ambulatory Visit
Admission: RE | Admit: 2014-11-25 | Discharge: 2014-11-25 | Disposition: A | Discharge status: Home / Self Care | Payer: Medicare Other | Source: Ambulatory Visit

## 2014-11-25 DIAGNOSIS — Z1231 Encounter for screening mammogram for malignant neoplasm of breast: Secondary | ICD-10-CM

## 2014-12-14 ENCOUNTER — Other Ambulatory Visit: Payer: Self-pay | Admitting: Obstetrics & Gynecology

## 2014-12-14 NOTE — Telephone Encounter (Signed)
Medication refill request: Ambien 10 mg  Last AEX:  10/08/14 with Ms. Patty  Next AEX: 10/12/15 with Ms. Patty Last MMG (if hormonal medication request): N/A Refill authorized: Please advise.

## 2014-12-15 NOTE — Telephone Encounter (Addendum)
RX printed and signed by Ms. Patty, faxed to [facility] [address].

## 2014-12-29 DIAGNOSIS — M1711 Unilateral primary osteoarthritis, right knee: Secondary | ICD-10-CM | POA: Diagnosis not present

## 2014-12-29 DIAGNOSIS — M1712 Unilateral primary osteoarthritis, left knee: Secondary | ICD-10-CM | POA: Diagnosis not present

## 2014-12-31 DIAGNOSIS — K219 Gastro-esophageal reflux disease without esophagitis: Secondary | ICD-10-CM | POA: Diagnosis not present

## 2014-12-31 DIAGNOSIS — I10 Essential (primary) hypertension: Secondary | ICD-10-CM | POA: Diagnosis not present

## 2015-01-06 DIAGNOSIS — M1711 Unilateral primary osteoarthritis, right knee: Secondary | ICD-10-CM | POA: Diagnosis not present

## 2015-01-06 DIAGNOSIS — M1712 Unilateral primary osteoarthritis, left knee: Secondary | ICD-10-CM | POA: Diagnosis not present

## 2015-01-07 DIAGNOSIS — I10 Essential (primary) hypertension: Secondary | ICD-10-CM | POA: Diagnosis not present

## 2015-01-13 DIAGNOSIS — M1712 Unilateral primary osteoarthritis, left knee: Secondary | ICD-10-CM | POA: Diagnosis not present

## 2015-01-13 DIAGNOSIS — M1711 Unilateral primary osteoarthritis, right knee: Secondary | ICD-10-CM | POA: Diagnosis not present

## 2015-01-23 ENCOUNTER — Other Ambulatory Visit: Payer: Self-pay | Admitting: Obstetrics & Gynecology

## 2015-01-25 NOTE — Telephone Encounter (Signed)
Medication refill request: Ambien 10 mg Last AEX:  10/08/14 PG Next AEX: 10/12/15 Last MMG (if hormonal medication request): 11/25/14 [id number]:neg Refill authorized: 12/14/14 #30/0R. Today?

## 2015-02-03 DIAGNOSIS — H2513 Age-related nuclear cataract, bilateral: Secondary | ICD-10-CM | POA: Diagnosis not present

## 2015-02-08 DIAGNOSIS — I10 Essential (primary) hypertension: Secondary | ICD-10-CM | POA: Diagnosis not present

## 2015-03-01 DIAGNOSIS — I10 Essential (primary) hypertension: Secondary | ICD-10-CM | POA: Diagnosis not present

## 2015-03-22 ENCOUNTER — Encounter: Payer: Self-pay | Admitting: Interventional Cardiology

## 2015-03-22 ENCOUNTER — Ambulatory Visit (INDEPENDENT_AMBULATORY_CARE_PROVIDER_SITE_OTHER): Payer: Medicare Other | Admitting: Interventional Cardiology

## 2015-03-22 VITALS — BP 154/86 | HR 55 | Ht 60.0 in | Wt 231.0 lb

## 2015-03-22 DIAGNOSIS — I10 Essential (primary) hypertension: Secondary | ICD-10-CM | POA: Diagnosis not present

## 2015-03-22 DIAGNOSIS — I890 Lymphedema, not elsewhere classified: Secondary | ICD-10-CM | POA: Diagnosis not present

## 2015-03-22 DIAGNOSIS — E782 Mixed hyperlipidemia: Secondary | ICD-10-CM

## 2015-03-22 MED ORDER — AMLODIPINE BESYLATE 10 MG PO TABS: 10.0000 mg | ORAL_TABLET | Freq: Every day | ORAL | Status: DC

## 2015-03-22 MED ORDER — ATORVASTATIN CALCIUM 40 MG PO TABS: 40.0000 mg | ORAL_TABLET | Freq: Every day | ORAL | Status: DC

## 2015-03-22 NOTE — Progress Notes (Signed)
Patient ID: DEMYAH SMYRE, female   DOB: 09-08-1941, 74 y.o.   MRN: [id number]     Cardiology Office Note   Date:  03/24/2015   ID:  Alexandra Williams, DOB [birth year], MRN [id number]  PCP:  Milagros Evener, MD  Cardiologist:   Jettie Booze., MD   Chief Complaint  Patient presents with  . Follow-up    Blood Pressure Problems      History of Present Illness: Alexandra Williams is a 74 y.o. female who presents for evaluation of worsening HTN.  She had syncope several years ago and a negative cardiac w/u at that time.  She had an echo at that time.   She has had elevated BP readings over the course of the last few months.  She has had readings in the 086V systolic and diastolics in the low 784O.  Amlodipine was added and titrated.  Her BPs are better now but still in the 150/90s at time.     No chest pain. Walking limited by orthopedic problems. She has persistent lower extremity edema.     Past Medical History  Diagnosis Date  . Abnormal Pap smear 4/82    colpo/ecc negative  . Fibrocystic breast   . Uterine fibroid   . Cervical polyp 1/99  . Postmenopausal bleeding 3/99    on HRT/endometrial biopsy-focal simple hyperplasia  . Colon polyps   . Hypertension   . Elevated lipids   . Vitamin D deficiency   . Arthritis     knee  . Hives     undetermined-on 7 antihistamines  . Hyperlipidemia   . Vasculitis     bilat legs    Past Surgical History  Procedure Laterality Date  . Tonsillectomy and adenoidectomy      as a child  . Incision and drainage      Bartholin cyst     Current Outpatient Prescriptions  Medication Sig Dispense Refill  . acetaminophen (RA ACETAMINOPHEN) 650 MG CR tablet Take 650 mg by mouth as needed.    Marland Kitchen aspirin 81 MG tablet Take 81 mg by mouth daily.    Marland Kitchen doxepin (SINEQUAN) 10 MG capsule Take 10 mg by mouth 2 (two) times daily as needed. Two caps daily    . EPIPEN 2-PAK 0.3 MG/0.3ML SOAJ as needed.    . furosemide (LASIX) 20 MG tablet Take  20 mg by mouth daily.  0  . hydrOXYzine (ATARAX/VISTARIL) 25 MG tablet Take 10 mg by mouth every 4 (four) hours as needed.     Marland Kitchen losartan (COZAAR) 100 MG tablet Take 100 mg by mouth daily.    . metoprolol succinate (TOPROL-XL) 100 MG 24 hr tablet Take 100 mg by mouth daily.  0  . ranitidine (ZANTAC) 150 MG tablet Take 150 mg by mouth at bedtime as needed.     . zolpidem (AMBIEN) 10 MG tablet TAKE 1/2 TO 1 TABLET BY MOUTH AT BEDTIME AS NEEDED FOR SLEEP 30 tablet 0  . amLODipine (NORVASC) 10 MG tablet Take 1 tablet (10 mg total) by mouth daily. In the evening. 90 tablet 3  . atorvastatin (LIPITOR) 40 MG tablet Take 1 tablet (40 mg total) by mouth daily. 90 tablet 3   No current facility-administered medications for this visit.    Allergies:   Codeine; Peanuts; Shellfish allergy; Sulfites; and Tessalon    Social History:  The patient  reports that she has never smoked. She has never used smokeless tobacco. She reports that she does not drink alcohol  or use illicit drugs.   Family History:  The patient's family history includes Cancer in her father, mother, and paternal aunt; Diabetes in her mother; Heart attack in her father; Heart disease in her father; Hyperlipidemia in her mother; Hypertension in her father and mother; Kidney disease in her father; Mental retardation in her daughter; Pancreatic cancer in her mother; Ulcers in her father.    ROS:  Please see the history of present illness.   Otherwise, review of systems are positive for joint pains.   All other systems are reviewed and negative.    PHYSICAL EXAM: VS:  BP 154/86 mmHg  Pulse 55  Ht 5' (1.524 m)  Wt 231 lb (104.781 kg)  BMI 45.11 kg/m2  LMP 12/18/2000 , BMI Body mass index is 45.11 kg/(m^2). GEN: Well nourished, well developed, in no acute distress HEENT: normal Neck: no JVD, carotid bruits, or masses Cardiac: RRR; no murmurs, rubs, or gallops,no edema  Respiratory:  clear to auscultation bilaterally, normal work of  breathing GI: soft, nontender, nondistended, + BS MS: no deformity or atrophy Skin: warm and dry, no rash; significant lower extremity edema Neuro:  Strength and sensation are intact Psych: euthymic mood, full affect   EKG:  EKG is ordered today. The ekg ordered today demonstrates sinus bradycardia, no ST segment changes   Recent Labs: No results found for requested labs within last 365 days.    Lipid Panel No results found for: CHOL, TRIG, HDL, CHOLHDL, VLDL, LDLCALC, LDLDIRECT    Wt Readings from Last 3 Encounters:  03/22/15 231 lb (104.781 kg)  10/08/14 226 lb (102.513 kg)  02/18/14 213 lb 6.4 oz (96.798 kg)      Other studies Reviewed: Additional studies/ records that were reviewed today include: Prior echocardiogram from 2014 showing normal left ventricular function and normal valvular function.  Prior rhythm monitoring showing no atrial fibrillation and no significant pauses  From 2014.    ASSESSMENT AND PLAN:  1.  Hypertension: Continue losartan 100 mg daily. Increase amlodipine to 10 mg daily. See if this helps blood pressure lowering.  2.  Edema: Known lymphedema. Continue to elevate legs.  3.  Hyperlipidemia: Stop simvastatin. Start atorvastatin 40 mg daily due to interaction with amlodipine. She will need lipid and liver recheck in several months.   Current medicines are reviewed at length with the patient today.  The patient has concerns regarding medicines.  The following changes have been made:  Increased Norvasc, change simvastatin to atorvastatin  Labs/ tests ordered today include:   Orders Placed This Encounter  Procedures  . EKG 12-Lead     Disposition:   FU in 4 months   Teresita Madura., MD  03/24/2015 1:21 PM    [facility] [place], [place], [place]  [address] Phone: [phone]; Fax: [phone]

## 2015-03-22 NOTE — Patient Instructions (Signed)
Your physician has recommended you make the following change in your medication:  1) STOP taking Simvastatin 2) INCREASE Amlodipine to 10mg  daily in the evening. 3) Take Losartan in the morning. 4) START taking Atorvastatin 40mg  daily  Your physician recommends that you schedule a follow-up appointment in: 3 months with Dr. Irish Lack.

## 2015-04-01 ENCOUNTER — Other Ambulatory Visit: Payer: Self-pay | Admitting: Obstetrics & Gynecology

## 2015-04-01 NOTE — Telephone Encounter (Signed)
Medication refill request: Ambien 10mg -last refilled 01-26-15 #30/0RF  Last AEX:  10/08/14 Next AEX: 10/13/15 Last MMG (if hormonal medication request): 11/25/14 3D-normal Refill authorized: please advise

## 2015-04-02 NOTE — Telephone Encounter (Signed)
RX signed and faxed to [place]

## 2015-04-27 DIAGNOSIS — R7309 Other abnormal glucose: Secondary | ICD-10-CM | POA: Diagnosis not present

## 2015-04-27 DIAGNOSIS — Z658 Other specified problems related to psychosocial circumstances: Secondary | ICD-10-CM | POA: Diagnosis not present

## 2015-04-27 DIAGNOSIS — E782 Mixed hyperlipidemia: Secondary | ICD-10-CM | POA: Diagnosis not present

## 2015-04-27 DIAGNOSIS — I10 Essential (primary) hypertension: Secondary | ICD-10-CM | POA: Diagnosis not present

## 2015-04-27 DIAGNOSIS — Z0000 Encounter for general adult medical examination without abnormal findings: Secondary | ICD-10-CM | POA: Diagnosis not present

## 2015-06-09 ENCOUNTER — Other Ambulatory Visit: Payer: Self-pay | Admitting: Obstetrics & Gynecology

## 2015-06-09 NOTE — Telephone Encounter (Signed)
Medication refill request: Ambien 5 mg  Last AEX:  10/08/14 with PG  Next AEX: 10/12/15 with PG  Last MMG (if hormonal medication request): n/a Refill authorized: Please advise.

## 2015-06-10 NOTE — Telephone Encounter (Signed)
Rx faxed to [facility]. 

## 2015-06-24 DIAGNOSIS — J301 Allergic rhinitis due to pollen: Secondary | ICD-10-CM | POA: Diagnosis not present

## 2015-06-24 DIAGNOSIS — L501 Idiopathic urticaria: Secondary | ICD-10-CM | POA: Diagnosis not present

## 2015-06-24 DIAGNOSIS — J3089 Other allergic rhinitis: Secondary | ICD-10-CM | POA: Diagnosis not present

## 2015-06-24 DIAGNOSIS — T783XXA Angioneurotic edema, initial encounter: Secondary | ICD-10-CM | POA: Diagnosis not present

## 2015-07-01 ENCOUNTER — Telehealth: Payer: Self-pay

## 2015-07-01 NOTE — Telephone Encounter (Signed)
Phone call from pt.  Reported intermittent throbbing in left lower leg, just above the ankle, and in the knee area.  Reported the throbbing occurs usually after being up on her feet/ legs a little while. Stated laying down with legs elevated will improve the pain.  Stated she continues to have swelling from her toes all the way up her legs.  Reported small, clear, raised areas scattered on anterior left lower leg, that "look like blisters."  Reported this has been there for several months, and haven't changed.  Stated "there is redness in the left lower leg just above the ankle, that has always been there, but seems more intense the last 2 wks."  Reported she has been up on her feet a lot, and on-the-go frequently, since her husband has been in [place]. for the past 8 weeks.  Denied any open sores or bulging varicose veins.   Reported the right leg has swelling, but that the left > right.  Discussed with Dr. Oneida Alar in office.  Recommended to schedule office visit at next available, but no venous studies are needed at this time.

## 2015-07-02 NOTE — Telephone Encounter (Signed)
Tried to reach Alexandra Williams at 9:18am this morning, but she was not in. The answering party asked that I call back at a later time.

## 2015-07-07 ENCOUNTER — Ambulatory Visit: Payer: Medicare Other | Admitting: Vascular Surgery

## 2015-07-28 DIAGNOSIS — J301 Allergic rhinitis due to pollen: Secondary | ICD-10-CM | POA: Diagnosis not present

## 2015-07-28 DIAGNOSIS — J3089 Other allergic rhinitis: Secondary | ICD-10-CM | POA: Diagnosis not present

## 2015-07-28 DIAGNOSIS — L501 Idiopathic urticaria: Secondary | ICD-10-CM | POA: Diagnosis not present

## 2015-07-28 DIAGNOSIS — T783XXA Angioneurotic edema, initial encounter: Secondary | ICD-10-CM | POA: Diagnosis not present

## 2015-08-03 ENCOUNTER — Encounter: Payer: Self-pay | Admitting: Vascular Surgery

## 2015-08-04 ENCOUNTER — Encounter: Payer: Self-pay | Admitting: Vascular Surgery

## 2015-08-04 ENCOUNTER — Ambulatory Visit (INDEPENDENT_AMBULATORY_CARE_PROVIDER_SITE_OTHER): Payer: Medicare Other | Admitting: Vascular Surgery

## 2015-08-04 VITALS — BP 120/66 | HR 59 | Temp 98.6°F | Resp 16 | Ht 60.0 in | Wt 229.0 lb

## 2015-08-04 DIAGNOSIS — I872 Venous insufficiency (chronic) (peripheral): Secondary | ICD-10-CM | POA: Diagnosis not present

## 2015-08-04 MED ORDER — CEPHALEXIN 500 MG PO CAPS: 500.0000 mg | ORAL_CAPSULE | Freq: Three times a day (TID) | ORAL | Status: DC

## 2015-08-04 NOTE — Progress Notes (Signed)
Filed Vitals:   08/04/15 1058 08/04/15 1109  BP: 159/68 120/66  Pulse: 60 59  Temp: 98.6 F (37 C)   TempSrc: Oral   Resp: 16   Height: 5' (1.524 m)   Weight: 229 lb (103.874 kg)   SpO2: 96%

## 2015-08-04 NOTE — Progress Notes (Signed)
Patient ID: Alexandra Williams, female   DOB: 07-28-1941, 74 y.o.   MRN: [id number]   [facility]  Patient name: Alexandra Williams MRN: [id number] DOB: [birth year] Sex: female  REASON FOR VISIT: lymphedema  HPI: [name] is a 74 y.o. female who I saw on 02/18/2014 with bilateral lower extremity swelling. I felt that she had lymphedema. There was no evidence of significant chronic venous insufficiency. We discussed the importance of intermittent leg elevation and also I wrote her a prescription for compression stockings with a gradient of 20-30 mmHg.  Since I saw her last, she's had increasing pain and swelling in both lower extremities. She has been taking care of her husband has been sick so she has been on her feet a lot more than normal. She has noted some cellulitis especially in the left leg. The left leg symptoms are more significant than on the right. She denies fever or chills.  Past Medical History  Diagnosis Date  . Abnormal Pap smear 4/82    colpo/ecc negative  . Fibrocystic breast   . Uterine fibroid   . Cervical polyp 1/99  . Postmenopausal bleeding 3/99    on HRT/endometrial biopsy-focal simple hyperplasia  . Colon polyps   . Hypertension   . Elevated lipids   . Vitamin D deficiency   . Arthritis     knee  . Hives     undetermined-on 7 antihistamines  . Hyperlipidemia   . Vasculitis     bilat legs   Family History  Problem Relation Age of Onset  . Mental retardation Daughter   . Pancreatic cancer Mother   . Cancer Mother   . Diabetes Mother   . Hyperlipidemia Mother   . Hypertension Mother   . Cancer Paternal Aunt     mouth cancer  . Heart attack Father   . Ulcers Father   . Heart disease Father   . Kidney disease Father   . Cancer Father   . Hypertension Father    SOCIAL HISTORY: Social History  Substance Use Topics  . Smoking status: Never Smoker   . Smokeless tobacco: Never Used  . Alcohol Use: No   Allergies    Allergen Reactions  . Codeine Hives  . Peanuts [Peanut Oil] Hives  . Shellfish Allergy Hives  . Sulfites Hives    preservatives  . Tessalon [Benzonatate] Hives and Itching   Current Outpatient Prescriptions  Medication Sig Dispense Refill  . amLODipine (NORVASC) 10 MG tablet Take 1 tablet (10 mg total) by mouth daily. In the evening. 90 tablet 3  . aspirin 81 MG tablet Take 81 mg by mouth daily.    Marland Kitchen atorvastatin (LIPITOR) 40 MG tablet Take 1 tablet (40 mg total) by mouth daily. 90 tablet 3  . doxepin (SINEQUAN) 10 MG capsule Take 10 mg by mouth 2 (two) times daily as needed. Two caps daily    . furosemide (LASIX) 20 MG tablet Take 20 mg by mouth daily.  0  . hydrOXYzine (ATARAX/VISTARIL) 25 MG tablet Take 10 mg by mouth every 4 (four) hours as needed.     Marland Kitchen losartan (COZAAR) 100 MG tablet Take 100 mg by mouth daily.    . metoprolol succinate (TOPROL-XL) 100 MG 24 hr tablet Take 100 mg by mouth daily.  0  . Omalizumab (XOLAIR [place]) Inject into the skin every 30 (thirty) days.    . Potassium 99 MG TABS Take by mouth daily.    Marland Kitchen  ranitidine (ZANTAC) 150 MG tablet Take 150 mg by mouth at bedtime as needed.     . zolpidem (AMBIEN) 5 MG tablet 1/2 to 1 tab qhs prn insomnia 30 tablet 1  . acetaminophen (RA ACETAMINOPHEN) 650 MG CR tablet Take 650 mg by mouth as needed.    . cephALEXin (KEFLEX) 500 MG capsule Take 1 capsule (500 mg total) by mouth 3 (three) times daily. 42 capsule 2  . EPIPEN 2-PAK 0.3 MG/0.3ML SOAJ as needed.    Marland Kitchen levocetirizine (XYZAL) 5 MG tablet take 2 tablets by mouth once daily every morning  0  . triamcinolone ointment (KENALOG) 0.1 % Apply topically as needed.  0   No current facility-administered medications for this visit.   REVIEW OF SYSTEMS: Valu.Nieves ] denotes positive finding; [  ] denotes negative finding  CARDIOVASCULAR:  [ ]  chest pain   [ ]  chest pressure   [ ]  palpitations   [ ]  orthopnea   [ ]  dyspnea on exertion   [ ]  claudication   [ ]  rest pain   [ ]  DVT   [ ]   phlebitis PULMONARY:   [ ]  productive cough   [ ]  asthma   [ ]  wheezing NEUROLOGIC:   [ ]  weakness  [ ]  paresthesias  [ ]  aphasia  [ ]  amaurosis  [ ]  dizziness HEMATOLOGIC:   [ ]  bleeding problems   [ ]  clotting disorders MUSCULOSKELETAL:  [ ]  joint pain   [ ]  joint swelling Valu.Nieves ] leg swelling GASTROINTESTINAL: [ ]   blood in stool  [ ]   hematemesis GENITOURINARY:  [ ]   dysuria  [ ]   hematuria PSYCHIATRIC:  [ ]  history of major depression INTEGUMENTARY:  [ ]  rashes  [ ]  ulcers CONSTITUTIONAL:  [ ]  fever   [ ]  chills  PHYSICAL EXAM: Filed Vitals:   08/04/15 1058 08/04/15 1109  BP: 159/68 120/66  Pulse: 60 59  Temp: 98.6 F (37 C)   TempSrc: Oral   Resp: 16   Height: 5' (1.524 m)   Weight: 229 lb (103.874 kg)   SpO2: 96%    GENERAL: The patient is a well-nourished female, in no acute distress. The vital signs are documented above. CARDIAC: There is a regular rate and rhythm.  VASCULAR: I do not detect carotid bruits. She has significant severe bilateral lower extremity swelling consistent with lymphedema. His of the swelling I am unable to palpate pedal pulses. However both feet are warm and well-perfused. PULMONARY: There is good air exchange bilaterally without wheezing or rales. ABDOMEN: Soft and non-tender with normal pitched bowel sounds.  MUSCULOSKELETAL: There are no major deformities or cyanosis. NEUROLOGIC: No focal weakness or paresthesias are detected. SKIN: She has cellulitis of the left leg consistent with lymphangitis. PSYCHIATRIC: The patient has a normal affect.  DATA:  I reviewed her previous duplex scan which showed no evidence of chronic venous insufficiency. She had no reflux in the superficial veins and no significant deep vein reflux.  MEDICAL ISSUES:  BILATERAL LOWER EXTREMITY LYMPHEDEMA WITH LYMPHANGITIS:   I have written her a prescription for Keflex for 2 weeks for her lymphangitis. She understands that if the cellulitis persists she should continue this  for another 2 weeks and I have given her a refill. We have also discussed the importance of intermittent leg elevation of the proper positioning for this. In addition I have given her a prescription for pantyhose compression stockings with a gradient of 20-30 mmHg. I've encouraged her to exercise.  I'll see her back in 1 year or less she call sooner.   Return in about 1 year (around 08/03/2016).   Deitra Mayo [facility] [place]: [phone]

## 2015-08-11 ENCOUNTER — Other Ambulatory Visit: Payer: Self-pay | Admitting: Obstetrics & Gynecology

## 2015-08-11 NOTE — Telephone Encounter (Signed)
Medication refill request: Ambien 5 mg  Last AEX:  10/08/14 with PG Next AEX:  10/12/15 with PG  Last MMG (if hormonal medication request): n/a Refill authorized: #30/1

## 2015-08-12 ENCOUNTER — Telehealth: Payer: Self-pay

## 2015-08-12 DIAGNOSIS — L03126 Acute lymphangitis of left lower limb: Secondary | ICD-10-CM

## 2015-08-12 MED ORDER — CIPROFLOXACIN HCL 500 MG PO TABS: 500.0000 mg | ORAL_TABLET | Freq: Two times a day (BID) | ORAL | Status: DC

## 2015-08-12 NOTE — Telephone Encounter (Signed)
Phone call from pt.  Reported she started taking Keflex last week, as prescribed by Dr. Scot Dock.  Stated she took the medication x 24 hrs, and broke-out in hives.  Stated she stopped taking the Keflex and has noticed the hives have improved.  Stated her "left leg is more red", but the swelling is unchanged, since evaluated on 8/17.  Denied any open sores or drainage.  Is requesting a prescription for a different antibiotic.  Advised will discuss with Dr. Scot Dock, and return call with recommendations.

## 2015-08-12 NOTE — Telephone Encounter (Signed)
RE: had allergic reaction to Keflex  Received: Today    Angelia Mould, MD  Joline Salt Amsi Grimley, RN           Cipro 500 mg po BID for 2 weeks, 2 refills.  Thanks  Gerald Stabs      Attempted to call pt.  Left voice message re: new antibiotic ordered by Dr. Scot Dock.  Advised the new prescription will be faxed to her pharmacy, and to call office if questions.

## 2015-08-17 MED ORDER — ZOLPIDEM TARTRATE 5 MG PO TABS: ORAL_TABLET | ORAL | Status: DC

## 2015-08-17 NOTE — Telephone Encounter (Signed)
rx printed and faxed to [facility]

## 2015-08-17 NOTE — Addendum Note (Signed)
Addended by: Alfonzo Feller on: 08/17/2015 08:31 AM   Modules accepted: Orders

## 2015-08-30 DIAGNOSIS — L501 Idiopathic urticaria: Secondary | ICD-10-CM | POA: Diagnosis not present

## 2015-09-01 ENCOUNTER — Telehealth: Payer: Self-pay

## 2015-09-01 NOTE — Telephone Encounter (Signed)
Phone call from pt.  Reported she completed the 1st prescription for Cipro, and noted continued redness of the left leg.  Questioned if she should refill the antibiotic?  Stated she didn't even know she had any infection, prior to Dr. Scot Dock seeing her, so she isn't sure of the need to continue the antibiotic.  Stated the redness has improved, but not completely resolved.  Advised she should refill the Cipro prescription x 1.  Instructed to call office if symptoms haven't resolved after 2nd round of Cipro.  Verb. Understanding.

## 2015-09-27 DIAGNOSIS — L501 Idiopathic urticaria: Secondary | ICD-10-CM | POA: Diagnosis not present

## 2015-10-11 DIAGNOSIS — Z23 Encounter for immunization: Secondary | ICD-10-CM | POA: Diagnosis not present

## 2015-10-12 ENCOUNTER — Ambulatory Visit (INDEPENDENT_AMBULATORY_CARE_PROVIDER_SITE_OTHER): Payer: Medicare Other | Admitting: Nurse Practitioner

## 2015-10-12 ENCOUNTER — Encounter: Payer: Self-pay | Admitting: Nurse Practitioner

## 2015-10-12 VITALS — BP 132/78 | HR 84 | Resp 20 | Ht 60.0 in | Wt 229.6 lb

## 2015-10-12 DIAGNOSIS — Z01419 Encounter for gynecological examination (general) (routine) without abnormal findings: Secondary | ICD-10-CM

## 2015-10-12 DIAGNOSIS — E2839 Other primary ovarian failure: Secondary | ICD-10-CM

## 2015-10-12 DIAGNOSIS — N9089 Other specified noninflammatory disorders of vulva and perineum: Secondary | ICD-10-CM | POA: Diagnosis not present

## 2015-10-12 DIAGNOSIS — I10 Essential (primary) hypertension: Secondary | ICD-10-CM | POA: Diagnosis not present

## 2015-10-12 DIAGNOSIS — Z0000 Encounter for general adult medical examination without abnormal findings: Secondary | ICD-10-CM | POA: Diagnosis not present

## 2015-10-12 NOTE — Patient Instructions (Addendum)

## 2015-10-12 NOTE — Progress Notes (Signed)
Patient ID: Alexandra Williams, female   DOB: 1941/03/23, 74 y.o.   MRN: [id number] 74 y.o. [id number] Married  Caucasian Fe here for annual exam.  No vaso symptoms.  Sleep still not good secondary to husbands health issues.  He has fallen out of bed and suffered a fracture femur in the spring.  He also remains on dialysis. Her daughter who is special needs also requires her help.  Patient's last menstrual period was 12/18/2000.          Sexually active: No.  The current method of family planning is post menopausal status.    Exercising: No.   Smoker:  no  Health Maintenance: Pap:06/27/12, Negative MMG:11/25/14, 3D, Bi-Rads 1: Negative  Colonoscopy: 05/08/14, polyp, repeat in 5 years BMD: 6/09 normal -0.4 Spine / 0.1 Left Femur Neck TDaP: PCP Shingles: up to date, flu vaccine 10/11/15 Labs: PCP   reports that she has never smoked. She has never used smokeless tobacco. She reports that she does not drink alcohol or use illicit drugs.  Past Medical History  Diagnosis Date  . Abnormal Pap smear 4/82    colpo/ecc negative  . Fibrocystic breast   . Uterine fibroid   . Cervical polyp 1/99  . Postmenopausal bleeding 3/99    on HRT/endometrial biopsy-focal simple hyperplasia  . Colon polyps   . Hypertension   . Elevated lipids   . Vitamin D deficiency   . Arthritis     knee  . Hives     undetermined-on 7 antihistamines  . Hyperlipidemia   . Vasculitis ([place])     bilat legs    Past Surgical History  Procedure Laterality Date  . Tonsillectomy and adenoidectomy      as a child  . Incision and drainage      Bartholin cyst    Current Outpatient Prescriptions  Medication Sig Dispense Refill  . acetaminophen (RA ACETAMINOPHEN) 650 MG CR tablet Take 650 mg by mouth as needed.    Marland Kitchen amLODipine (NORVASC) 10 MG tablet Take 1 tablet (10 mg total) by mouth daily. In the evening. 90 tablet 3  . aspirin 81 MG tablet Take 81 mg by mouth daily.    Marland Kitchen atorvastatin (LIPITOR) 40 MG tablet Take  1 tablet (40 mg total) by mouth daily. 90 tablet 3  . doxepin (SINEQUAN) 10 MG capsule Take 10 mg by mouth 2 (two) times daily as needed. Two caps daily    . EPIPEN 2-PAK 0.3 MG/0.3ML SOAJ as needed.    . hydrOXYzine (ATARAX/VISTARIL) 25 MG tablet Take 10 mg by mouth every 4 (four) hours as needed.     Marland Kitchen levocetirizine (XYZAL) 5 MG tablet take 2 tablets by mouth once daily every morning  0  . losartan (COZAAR) 100 MG tablet Take 100 mg by mouth daily.    . metoprolol succinate (TOPROL-XL) 100 MG 24 hr tablet Take 100 mg by mouth daily.  0  . Omalizumab (XOLAIR [place]) Inject into the skin every 30 (thirty) days.    . ranitidine (ZANTAC) 150 MG tablet Take 150 mg by mouth at bedtime as needed.     . triamcinolone ointment (KENALOG) 0.1 % Apply topically as needed.  0  . zolpidem (AMBIEN) 5 MG tablet TAKE 1/2 TO 1 TABLET BY MOUTH AT BEDTIME IF NEEDED FOR INSOMNIA 30 tablet 1   No current facility-administered medications for this visit.    Family History  Problem Relation Age of Onset  . Mental retardation Daughter   . Pancreatic  cancer Mother   . Cancer Mother   . Diabetes Mother   . Hyperlipidemia Mother   . Hypertension Mother   . Cancer Paternal Aunt     mouth cancer  . Heart attack Father   . Ulcers Father   . Heart disease Father   . Kidney disease Father   . Cancer Father   . Hypertension Father     ROS:  Pertinent items are noted in HPI.  Otherwise, a comprehensive ROS was negative.  Exam:   BP 132/78 mmHg  Pulse 84  Resp 20  Ht 5' (1.524 m)  Wt 229 lb 9.6 oz (104.146 kg)  BMI 44.84 kg/m2  LMP 12/18/2000 Height: 5' (152.4 cm) Ht Readings from Last 3 Encounters:  10/12/15 5' (1.524 m)  08/04/15 5' (1.524 m)  03/22/15 5' (1.524 m)    General appearance: alert, cooperative and appears stated age Head: Normocephalic, without obvious abnormality, atraumatic Neck: no adenopathy, supple, symmetrical, trachea midline and thyroid normal to inspection and palpation Lungs:  clear to auscultation bilaterally Breasts: normal appearance, no masses or tenderness Heart: regular rate and rhythm Abdomen: soft, non-tender; no masses,  no organomegaly Extremities: extremities normal, atraumatic, no cyanosis or edema Skin: Skin color, texture, turgor normal. No rashes or lesions.  Bilateral leg lymphedema with vasculitis. Lymph nodes: Cervical, supraclavicular, and axillary nodes normal. No abnormal inguinal nodes palpated Neurologic: Grossly normal   Pelvic: External genitalia:   Lesion on left vulva that is elevated at 6 mm and looks like a wart.  This was seen by Dr. Talbert Nan as well and felt that removal would be indicated.              Urethra:  normal appearing urethra with no masses, tenderness or lesions              Bartholin's and Skene's: normal                 Vagina: normal appearing vagina with normal color and discharge, no lesions              Cervix: anteverted              Pap taken: No. Bimanual Exam:  Uterus:  normal size, contour, position, consistency, mobility, non-tender              Adnexa: no mass, fullness, tenderness               Rectovaginal: Confirms               Anus:  normal sphincter tone, no lesions  Chaperone present: yes  A:  Well Woman with normal exam  Postmenopausal no HRT  History of Hypertension, Obesity, Elevated lipids, vasculitis  Bilateral knee arthritis - use of cane secondary to lymphedema Vitamin D deficiency  Lesion left labia that needs removal - bleeding about a month ago  P:   Reviewed health and wellness pertinent to exam  Pap smear as above  Mammogram is due 11/2015 and will order BMD  Order is placed for removal of lesion left labia by Dr. Lajean Manes on breast self exam, mammography screening, adequate intake of calcium and vitamin D, diet and exercise return annually or prn  An After Visit Summary was printed and given to the patient.

## 2015-10-13 NOTE — Progress Notes (Signed)
Reviewed personally.  M. Suzanne Kimika Streater, MD.  

## 2015-10-27 DIAGNOSIS — L501 Idiopathic urticaria: Secondary | ICD-10-CM | POA: Diagnosis not present

## 2015-11-01 DIAGNOSIS — I10 Essential (primary) hypertension: Secondary | ICD-10-CM | POA: Diagnosis not present

## 2015-11-01 DIAGNOSIS — E782 Mixed hyperlipidemia: Secondary | ICD-10-CM | POA: Diagnosis not present

## 2015-11-01 DIAGNOSIS — R7301 Impaired fasting glucose: Secondary | ICD-10-CM | POA: Diagnosis not present

## 2015-11-19 DIAGNOSIS — K921 Melena: Secondary | ICD-10-CM | POA: Diagnosis not present

## 2015-11-25 DIAGNOSIS — L501 Idiopathic urticaria: Secondary | ICD-10-CM | POA: Diagnosis not present

## 2015-11-26 ENCOUNTER — Other Ambulatory Visit: Payer: Self-pay

## 2015-11-26 DIAGNOSIS — Z1231 Encounter for screening mammogram for malignant neoplasm of breast: Secondary | ICD-10-CM

## 2015-12-14 ENCOUNTER — Telehealth: Payer: Self-pay | Admitting: *Deleted

## 2015-12-14 NOTE — Telephone Encounter (Signed)
Voicemail: "I have lower leg lymphedema.  My doctor said to cal the cancer center to help me get the correct person for message thereapy on my legs.  I need the correct person to eliminate fluid.  My doctor gave me a list of places to call and no one can help me."  Called informing her any leg swelling should be evaluated.  Reports she "has been checked and does not have any blood clots.  No recent surgeries.  I have ted hose and they will not stay up on my legs."  Our patient population experiencing lymphedema a referral is made to [facility] Physical Therapy department's lymphedema clinic.  This is not a patient call but a referral must be ordered by a provider.  This office cannot order, refer or advise except for our established patients.  "I will call Dr. Scot Dock to see if he can help.  Thanks."

## 2015-12-15 ENCOUNTER — Other Ambulatory Visit: Payer: Self-pay | Admitting: Obstetrics & Gynecology

## 2015-12-16 NOTE — Telephone Encounter (Signed)
Medication refill request: Ambien 5mg  tablet Last AEX:  10/12/15 PG Next AEX: 10/13/2016 PG Last MMG (if hormonal medication request): 11/25/2014 BIRADS category 1 Negative Refill authorized: 08/17/15 Ambien #30 tabs 1 Refill  Today: #30 tabs 1 Refill ? Please advise

## 2015-12-22 ENCOUNTER — Other Ambulatory Visit: Payer: Self-pay | Admitting: Obstetrics & Gynecology

## 2015-12-22 DIAGNOSIS — L501 Idiopathic urticaria: Secondary | ICD-10-CM | POA: Diagnosis not present

## 2015-12-22 DIAGNOSIS — J3089 Other allergic rhinitis: Secondary | ICD-10-CM | POA: Diagnosis not present

## 2015-12-22 DIAGNOSIS — J301 Allergic rhinitis due to pollen: Secondary | ICD-10-CM | POA: Diagnosis not present

## 2015-12-22 DIAGNOSIS — T783XXA Angioneurotic edema, initial encounter: Secondary | ICD-10-CM | POA: Diagnosis not present

## 2015-12-22 NOTE — Telephone Encounter (Signed)
Patient calling to check status of refill request.

## 2015-12-22 NOTE — Telephone Encounter (Signed)
Medication refill request: ambien  Last AEX:  10/12/15 PG Next AEX: 10/13/16 PG Last MMG (if hormonal medication request): 11/25/14 BIRADS1:neg Refill authorized: 08/17/15 #30/1R. Today please advise.

## 2015-12-23 NOTE — Telephone Encounter (Signed)
Roselyn Reef from [facility] called to check on the status of the request.

## 2015-12-24 ENCOUNTER — Telehealth: Payer: Self-pay

## 2015-12-24 DIAGNOSIS — L501 Idiopathic urticaria: Secondary | ICD-10-CM | POA: Diagnosis not present

## 2015-12-24 NOTE — Addendum Note (Signed)
Addended by: Megan Salon on: 12/24/2015 10:08 AM   Modules accepted: Orders, SmartSet

## 2015-12-24 NOTE — Telephone Encounter (Signed)
Ambien refill faxed to [facility]. Call to patient. Left message with female to have patient call back. Female states she is at a doctors appointment.

## 2015-12-24 NOTE — Telephone Encounter (Signed)
Return call to patient. Patient verbalized frustration regarding length of time for refill. Apologized for delay.  Patient continues to be very upset regarding refill delay and "absolutely unacceptable" that refill was not handled properly. Discussed that refill for Ambien is handled more carefully, especially in elderly and will be discussed further at next appointment.  Agreed that we should have updated her during this process. Advised we were recommending to proceed with scheduling vulvar biopsy appointment. Patient declines to schedule. 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 She states this is just "skin tag" and is not bothering her and she feels it is "not urgent." She states she will call back to schedule. Advised will assist with scheduling to accommodate her scheduling needs. She can call me back directly. Will check her schedule and call me next week.

## 2015-12-24 NOTE — Telephone Encounter (Signed)
Patient is calling about status of Rx refill for Ambien.  The refill was requested on 12/15/15.  The pharmacy called yesterday trying to figure out the status.  The patient is concerned the refill will not be completed before the inclement weather.  After speaking with Pattie, she is routing to Dr. Sabra Heck for refill.//kg

## 2015-12-24 NOTE — Telephone Encounter (Signed)
Patient returning call. She will call [facility] to check on prescription.

## 2015-12-31 ENCOUNTER — Ambulatory Visit: Payer: Medicare Other

## 2015-12-31 ENCOUNTER — Inpatient Hospital Stay: Admission: RE | Admit: 2015-12-31 | Payer: Medicare Other | Source: Ambulatory Visit

## 2016-01-10 ENCOUNTER — Telehealth: Payer: Self-pay | Admitting: *Deleted

## 2016-01-10 NOTE — Telephone Encounter (Signed)
Follow-up call to patient to schedule vulvar biopsy. See previous phone encounter from 12-24-15. Patient states she is on her way out to an appointment and will have to call me back. Advised I am calling to schedule appointment that we discussed previously but haven't heard back from her.  Patient states she will have to look at her calendar and determine when she can schedule. States she will call me back this afternoon.

## 2016-01-11 NOTE — Telephone Encounter (Signed)
No response from patient. Please advise on next step.

## 2016-01-27 DIAGNOSIS — L501 Idiopathic urticaria: Secondary | ICD-10-CM | POA: Diagnosis not present

## 2016-01-28 ENCOUNTER — Ambulatory Visit: Payer: Medicare Other

## 2016-01-28 ENCOUNTER — Ambulatory Visit
Admission: RE | Admit: 2016-01-28 | Discharge: 2016-01-28 | Disposition: A | Discharge status: Home / Self Care | Payer: Medicare Other | Source: Ambulatory Visit

## 2016-01-28 ENCOUNTER — Ambulatory Visit
Admission: RE | Admit: 2016-01-28 | Discharge: 2016-01-28 | Disposition: A | Discharge status: Home / Self Care | Payer: Medicare Other | Source: Ambulatory Visit | Attending: Nurse Practitioner | Admitting: Nurse Practitioner

## 2016-01-28 DIAGNOSIS — E2839 Other primary ovarian failure: Secondary | ICD-10-CM

## 2016-01-28 DIAGNOSIS — M8589 Other specified disorders of bone density and structure, multiple sites: Secondary | ICD-10-CM | POA: Diagnosis not present

## 2016-01-28 DIAGNOSIS — Z1231 Encounter for screening mammogram for malignant neoplasm of breast: Secondary | ICD-10-CM

## 2016-01-31 ENCOUNTER — Other Ambulatory Visit: Payer: Self-pay | Admitting: Nurse Practitioner

## 2016-01-31 DIAGNOSIS — N6459 Other signs and symptoms in breast: Secondary | ICD-10-CM

## 2016-01-31 DIAGNOSIS — N63 Unspecified lump in unspecified breast: Secondary | ICD-10-CM

## 2016-02-01 DIAGNOSIS — H2513 Age-related nuclear cataract, bilateral: Secondary | ICD-10-CM | POA: Diagnosis not present

## 2016-02-07 ENCOUNTER — Ambulatory Visit
Admission: RE | Admit: 2016-02-07 | Discharge: 2016-02-07 | Disposition: A | Discharge status: Home / Self Care | Payer: Medicare Other | Source: Ambulatory Visit | Attending: Nurse Practitioner | Admitting: Nurse Practitioner

## 2016-02-07 DIAGNOSIS — N63 Unspecified lump in unspecified breast: Secondary | ICD-10-CM

## 2016-02-07 DIAGNOSIS — N6459 Other signs and symptoms in breast: Secondary | ICD-10-CM

## 2016-02-07 DIAGNOSIS — N644 Mastodynia: Secondary | ICD-10-CM | POA: Diagnosis not present

## 2016-02-09 ENCOUNTER — Telehealth: Payer: Self-pay

## 2016-02-09 NOTE — Telephone Encounter (Signed)
lmtcb to give bmd results 

## 2016-02-09 NOTE — Telephone Encounter (Signed)
-----   Message from [place], [place] sent at 02/09/2016 11:07 AM EST ----- lmtcb

## 2016-02-11 NOTE — Telephone Encounter (Signed)
Patient notified of results. See lab 

## 2016-02-20 ENCOUNTER — Other Ambulatory Visit: Payer: Self-pay | Admitting: Obstetrics & Gynecology

## 2016-02-21 ENCOUNTER — Encounter: Payer: Self-pay | Admitting: Obstetrics & Gynecology

## 2016-02-21 NOTE — Telephone Encounter (Signed)
Faxed Rx for Ambien 5mg  #30, 0(zero) refills to [facility] [phone].

## 2016-02-21 NOTE — Telephone Encounter (Signed)
Medication refill request: Ambien Last AEX:  10-12-15 Next AEX: 10-13-16 Last MMG (if hormonal medication request): 02-07-16 DX MMG / US -WNL Refill authorized: please advise

## 2016-02-21 NOTE — Telephone Encounter (Signed)
Letter from Dr Sabra Heck sent certified and regular [place] mail. Encounter closed.

## 2016-02-24 DIAGNOSIS — L501 Idiopathic urticaria: Secondary | ICD-10-CM | POA: Diagnosis not present

## 2016-02-25 ENCOUNTER — Telehealth: Payer: Self-pay | Admitting: Obstetrics & Gynecology

## 2016-02-25 DIAGNOSIS — N9089 Other specified noninflammatory disorders of vulva and perineum: Secondary | ICD-10-CM

## 2016-02-25 NOTE — Telephone Encounter (Signed)
Spoke with patient. Patient states that she is ready to schedule her vulvar biopsy at this time. Appointment scheduled for 02/29/2016 at 10 am with Dr.Miller. Patient is to have removal of vulvar lesion on left labia per OV note from 10/12/2015 with Kem Boroughs, FNP. Order has been placed for precert.  Cc: Lerry Liner for precert  Routing to provider for final review. Patient agreeable to disposition. Will close encounter.

## 2016-02-25 NOTE — Telephone Encounter (Signed)
Patient says she is calling to schedule a biopsy.

## 2016-02-28 DIAGNOSIS — T783XXA Angioneurotic edema, initial encounter: Secondary | ICD-10-CM | POA: Diagnosis not present

## 2016-02-28 DIAGNOSIS — L501 Idiopathic urticaria: Secondary | ICD-10-CM | POA: Diagnosis not present

## 2016-02-28 DIAGNOSIS — J301 Allergic rhinitis due to pollen: Secondary | ICD-10-CM | POA: Diagnosis not present

## 2016-02-28 DIAGNOSIS — J3089 Other allergic rhinitis: Secondary | ICD-10-CM | POA: Diagnosis not present

## 2016-02-29 ENCOUNTER — Ambulatory Visit (INDEPENDENT_AMBULATORY_CARE_PROVIDER_SITE_OTHER): Payer: Medicare Other | Admitting: Obstetrics & Gynecology

## 2016-02-29 ENCOUNTER — Encounter: Payer: Self-pay | Admitting: Obstetrics & Gynecology

## 2016-02-29 DIAGNOSIS — D287 Benign neoplasm of other specified female genital organs: Secondary | ICD-10-CM | POA: Diagnosis not present

## 2016-02-29 DIAGNOSIS — D280 Benign neoplasm of vulva: Secondary | ICD-10-CM | POA: Diagnosis not present

## 2016-02-29 DIAGNOSIS — D1801 Hemangioma of skin and subcutaneous tissue: Secondary | ICD-10-CM | POA: Diagnosis not present

## 2016-02-29 DIAGNOSIS — N9089 Other specified noninflammatory disorders of vulva and perineum: Secondary | ICD-10-CM

## 2016-02-29 DIAGNOSIS — I890 Lymphedema, not elsewhere classified: Secondary | ICD-10-CM

## 2016-02-29 NOTE — Progress Notes (Addendum)
Patient ID: Alexandra Williams, female   DOB: Aug 15, 1941, 75 y.o.   MRN: [id number]  Chief Complaint  Patient presents with  . Procedure    Vulvar lesion //RB    HPI Alexandra Williams is a 75 y.o. female  G3P3 MWF here for excision or biopsy of vulvar lesion that was noted at AEX in October.  Pt had refused to come in until I sent a letter continuing to recommend treatment for abnormal vulvar findings.  Pt is here for this today.  Denies vaginal bleeding.  Reviewed reasons for biopsy and/or excision.  Pt comfortable with plan.  Reports husband is really failing and she is doing a lot of care giving.  This makes appts hard for the patient.  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  This has been good but ads financial stressors.     Pt also reports increased issues with lymphedema.  Saw vascular and vein specialist who told her there was nothing that could be done.  Recommended PT.  States she called the person that was recommended and was informed "we don't do that anymore".  Doesn't know where to go.  Wants suggestions.  Advised lymphedema PT clinic through [facility] that works with breast cancer pt's who have lymphedema, so this may be a possibility.  Will need to investigate for pt.  She is grateful for any suggestions.  Past Medical History  Diagnosis Date  . Abnormal Pap smear 4/82    colpo/ecc negative  . Fibrocystic breast   . Uterine fibroid   . Cervical polyp 1/99  . Postmenopausal bleeding 3/99    on HRT/endometrial biopsy-focal simple hyperplasia  . Colon polyps   . Hypertension   . Elevated lipids   . Vitamin D deficiency   . Arthritis     knee  . Hives     undetermined-on 7 antihistamines  . Hyperlipidemia   . Vasculitis ([place])     bilat legs    Past Surgical History  Procedure Laterality Date  . Tonsillectomy and adenoidectomy      as a child  . Incision and drainage      Bartholin cyst    Family History  Problem Relation Age of Onset  . Mental retardation Daughter   . Pancreatic  cancer Mother   . Cancer Mother   . Diabetes Mother   . Hyperlipidemia Mother   . Hypertension Mother   . Cancer Paternal Aunt     mouth cancer  . Heart attack Father   . Ulcers Father   . Heart disease Father   . Kidney disease Father   . Cancer Father   . Hypertension Father     Social History Social History  Substance Use Topics  . Smoking status: Never Smoker   . Smokeless tobacco: Never Used  . Alcohol Use: No    Allergies  Allergen Reactions  . Codeine Hives  . Peanuts [Peanut Oil] Hives  . Shellfish Allergy Hives  . Sulfites Hives    preservatives  . Tessalon [Benzonatate] Hives and Itching    Current Outpatient Prescriptions  Medication Sig Dispense Refill  . acetaminophen (RA ACETAMINOPHEN) 650 MG CR tablet Take 650 mg by mouth as needed.    Marland Kitchen amLODipine (NORVASC) 10 MG tablet Take 1 tablet (10 mg total) by mouth daily. In the evening. 90 tablet 3  . aspirin 81 MG tablet Take 81 mg by mouth daily.    Marland Kitchen atorvastatin (LIPITOR) 40 MG tablet Take 1 tablet (40 mg total) by  mouth daily. 90 tablet 3  . doxepin (SINEQUAN) 10 MG capsule Take 10 mg by mouth 2 (two) times daily as needed. Two caps daily    . EPIPEN 2-PAK 0.3 MG/0.3ML SOAJ as needed.    . hydrocortisone (ANUSOL-HC) 2.5 % rectal cream APPLY TO AFFECTED AREA RECTALLY DAILY AS DIRECTED BY YOUR DOCTOR  0  . hydrOXYzine (ATARAX/VISTARIL) 25 MG tablet Take 10 mg by mouth every 4 (four) hours as needed.     Marland Kitchen levocetirizine (XYZAL) 5 MG tablet take 2 tablets by mouth once daily every morning  0  . losartan (COZAAR) 100 MG tablet Take 100 mg by mouth daily.    . metoprolol succinate (TOPROL-XL) 100 MG 24 hr tablet Take 100 mg by mouth daily.  0  . Omalizumab (XOLAIR [place]) Inject into the skin every 30 (thirty) days.    . predniSONE (DELTASONE) 10 MG tablet Take 10 mg by mouth See admin instructions.  0  . ranitidine (ZANTAC) 150 MG tablet Take 150 mg by mouth at bedtime as needed.     . triamcinolone ointment  (KENALOG) 0.1 % Apply topically as needed.  0  . zolpidem (AMBIEN) 5 MG tablet TAKE 1/2 TO 1 TABLET BY MOUTH AT BEDTIME IF NEEDED FOR INSOMNIA 30 tablet 0   No current facility-administered medications for this visit.    Review of Systems Review of Systems  Gastrointestinal: Negative.   Genitourinary: Negative.     Blood pressure 120/60, pulse 70, resp. rate 18, height 5' (1.524 m), weight 230 lb (104.327 kg), last menstrual period 12/18/2000.  Physical Exam Physical Exam  Constitutional: She is oriented to person, place, and time. She appears well-developed and well-nourished.  Genitourinary: Vagina normal.    No labial fusion. There is lesion on the right labia. There is no rash, tenderness or injury on the right labia. There is no rash, tenderness, lesion or injury on the left labia.  Lymphadenopathy:       Right: No inguinal adenopathy present.       Left: No inguinal adenopathy present.  Neurological: She is alert and oriented to person, place, and time.  Skin: Skin is warm and dry.  Psychiatric: She has a normal mood and affect.   Lesion identified.  Area cleansed with Hibiclens.  Sterile technique used throughout procedure.  Local anesthesia with 1% Lidocaine, plain, Lot:[id number].  Exp: 02/15/17.  1cc total noted.  Lesion is about 1cm and purplish and raised.  It is larger then when I last saw her, > 2 years ago.  Lesion lifted with pick-ups and excised fully.  Silver Nitrate applied:  Yes for excellent hemostasis.  Pt tolerated procedure well.  Lesion labeled and sent to pathology.  Assessment   Vulvar lesion, enlarged and purplish, s/p excision  LE lymphedema due to obesity and probable venous stasis    Plan    Pathology will be called to pt.  Additional recommendations will be made then.  Pt aware to call with any problems/concerns. Will refer to PT.  Will need to make phone call for pt as well about whether this is done at [facility].        Hale Bogus  [facility] 02/29/2016, 10:31 AM

## 2016-02-29 NOTE — Progress Notes (Signed)
Lidocaine 1% Lot: [id number]. Exp: 02/15/17

## 2016-03-02 ENCOUNTER — Telehealth: Payer: Self-pay | Admitting: Emergency Medicine

## 2016-03-02 DIAGNOSIS — R600 Localized edema: Secondary | ICD-10-CM

## 2016-03-02 NOTE — Telephone Encounter (Signed)
Referral placed to outpatient rehab at [facility] for lymphedema physical therapy on 02/29/16. Spoke with Rose who handles incoming referrals at [phone]. She is having her supervisor review patient's chart to see if location is appropriate for patient and will call back or send inbasket message if another option is necessary. They will contact patient directly to schedule.

## 2016-03-07 NOTE — Addendum Note (Signed)
Addended by: Megan Salon on: 03/07/2016 02:47 PM   Modules accepted: Level of Service

## 2016-03-08 NOTE — Telephone Encounter (Signed)
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  This is a benign vascular tumor so that explains why it was larger and looked different.  Nothing else is needed.  Also, I have made some phone calls about the physical therapist referral and her lymphedema.  Let her know I am still working on it but will get some answers for her, hopefully.

## 2016-03-08 NOTE — Telephone Encounter (Signed)
Message left to return call to [place] at [phone].   Patient is scheduled for evaluation with PT for Lymphedema.  [facility] within [place] sees cancer patients only.  [facility]. [place] [facility] appointment scheduled for 03/23/16 at 1100.

## 2016-03-08 NOTE — Telephone Encounter (Signed)
Patient calling for biopsy results and to check on referral for lymphedema therapy.

## 2016-03-09 ENCOUNTER — Encounter: Payer: Self-pay | Admitting: Physical Therapy

## 2016-03-09 ENCOUNTER — Ambulatory Visit: Payer: Medicare Other | Attending: Obstetrics & Gynecology | Admitting: Physical Therapy

## 2016-03-09 DIAGNOSIS — M6281 Muscle weakness (generalized): Secondary | ICD-10-CM | POA: Insufficient documentation

## 2016-03-09 DIAGNOSIS — I890 Lymphedema, not elsewhere classified: Secondary | ICD-10-CM | POA: Insufficient documentation

## 2016-03-09 NOTE — Therapy (Signed)
Alexandra Williams, [place], [address] Phone: [phone]   Fax:  [phone]  Physical Therapy Evaluation  Patient Details  Name: Alexandra Williams MRN: [id number] Date of Birth: 09/21/1941 Referring Provider: Edwinna Williams  Encounter Date: 03/09/2016      PT End of Session - 03/09/16 1717    Visit Number 1   Number of Visits 25   Date for PT Re-Evaluation 05/04/16   PT Start Time [id number]   PT Stop Time 1430   PT Time Calculation (min) 85 min   Activity Tolerance Patient tolerated treatment well   Behavior During Therapy [facility] for tasks assessed/performed      Past Medical History  Diagnosis Date  . Abnormal Pap smear 4/82    colpo/ecc negative  . Fibrocystic breast   . Uterine fibroid   . Cervical polyp 1/99  . Postmenopausal bleeding 3/99    on HRT/endometrial biopsy-focal simple hyperplasia  . Colon polyps   . Hypertension   . Elevated lipids   . Vitamin D deficiency   . Arthritis     knee  . Hives     undetermined-on 7 antihistamines  . Hyperlipidemia   . Vasculitis ([place])     bilat legs    Past Surgical History  Procedure Laterality Date  . Tonsillectomy and adenoidectomy      as a child  . Incision and drainage      Bartholin cyst    There were no vitals filed for this visit.  Visit Diagnosis:  Lymphedema of both lower extremities - Plan: PT plan of care cert/re-cert  Muscle weakness (generalized) - Plan: PT plan of care cert/re-cert      Subjective Assessment - 03/09/16 1317    Subjective I am here because I have lymphedema in both of my lower legs. They are swollen and they never go down. I have had cellulitis once about 50 years ago. I first started having swelling when I was working 12 hr days standing on a concrete floor and the swelling used to go away. For the past 3-4 years it did not go away.    Pertinent History Pt reports she began having swelling over 50 years ago when  she was pregnant. She had her first cellulitis infection at that time. Her leg swelling would come and go until the past three to four years it has not gotten any better. Pt was treated for a cellulitis infection in October of 2016. Pt reports she does not have any heart or kidney problems, no family history of lymphedema, pt is in need of knee replacements and reports her knees buckle occasionally and that is why she uses a cane   Limitations Walking;Standing   How long can you stand comfortably? 20 minutes   How long can you walk comfortably? 10 minutes   Patient Stated Goals to get the fluid out of my legs and to strengthen my legs, be more comfortable when I walk   Currently in Pain? No/denies            [facility] PT Assessment - 03/09/16 0001    Assessment   Medical Diagnosis bilateral LE lymphedema   Referring Provider Alexandra Williams   Onset Date/Surgical Date 03/09/12   Hand Dominance Right   Prior Therapy none   Precautions   Precautions None   Restrictions   Weight Bearing Restrictions No   Balance Screen   Has the patient fallen in the past 6 months No  Has the patient had a decrease in activity level because of a fear of falling?  Yes   Is the patient reluctant to leave their home because of a fear of falling?  No   Home Environment   Living Environment Private residence   Living Arrangements Spouse/significant other;Children  daughter has autism and pt is caregiver for husband    Available Help at Discharge Family;Friend(s)   Type of [place] to enter   Entrance Stairs-Number of Steps 6   Bud reach both;Left;Right   Home Layout Two level;Bed/bath upstairs  has Psychologist, occupational for husband   Alternate Level Stairs-Number of Steps 16   Alternate Level Stairs-Rails Can reach both   [employer] - single point;Walker - 2 wheels;Bedside commode;Shower seat;Toilet riser;Grab bars - tub/shower;Grab bars - toilet;Hand held  shower head;Wheelchair - Banker   Prior Function   Level of Independence Independent with household mobility with device;Independent with homemaking with ambulation;Independent with community mobility with device;Independent with transfers   Vocation Retired   Leisure pt currently does not exercise   Cognition   Overall Cognitive Status Within Functional Limits for tasks assessed   Observation/Other Assessments   Other Surveys  Other Surveys  LLIS - 40% impairment   Strength   Right Hip Flexion 3+/5   Right Hip External Rotation  2+/5   Right Hip Internal Rotation 3/5   Right Hip ABduction 5/5   Right Hip ADduction 4/5   Left Hip Flexion 3+/5   Left Hip External Rotation 3-/5   Left Hip Internal Rotation 3-/5   Left Hip ABduction 5/5   Left Hip ADduction 4/5   Right Knee Flexion 3/5   Right Knee Extension 3/5   Left Knee Flexion 3/5   Left Knee Extension 3/5   Right Ankle Dorsiflexion 5/5   Left Ankle Dorsiflexion 4/5           LYMPHEDEMA/ONCOLOGY QUESTIONNAIRE - 03/09/16 1333    Date Lymphedema/Swelling Started   Date 03/09/12  began having swelling over 50 years ago, been steady for 63yr   What other symptoms do you have   Are you Having Heaviness or Tightness Yes   Are you having Pain No   Are you having pitting edema Yes   Body Site bilateral LEs, L worse than R   Is it Hard or Difficult finding clothes that fit Yes   Do you have infections Yes   Comments once in the past year and needed 2 rounds of antibiotics   Is there Decreased scar mobility --  n/a   Right Lower Extremity Lymphedema   30 cm Proximal to Suprapatella 75 cm   20 cm Proximal to Suprapatella 67.7 cm   10 cm Proximal to Suprapatella 65 cm   At Midpatella/Popliteal Crease 48.8 cm   30 cm Proximal to Floor at Lateral Plantar Foot 56 cm  30 cm prox to lateral malleoli   20 cm Proximal to Floor at Lateral Plantar Foot 54 1  20 cm prox to lateral malleoli   10 cm Proximal to Floor  at Lateral Malleoli 46.5 cm  10 cm prox to lateral malleoli   Circumference of ankle/heel 29.9 cm.   5 cm Proximal to 1st MTP Joint 22 cm   Across MTP Joint 21 cm   Around Proximal Great Toe 7.7 cm   Left Lower Extremity Lymphedema   30 cm Proximal to Suprapatella 77 cm   20 cm Proximal to  Suprapatella 69 cm   10 cm Proximal to Suprapatella 67 cm   At Midpatella/Popliteal Crease 54 cm   30 cm Proximal to Floor at Lateral Plantar Foot 58.5 cm  30 cm prox to lateral malleoli   20 cm Proximal to Floor at Lateral Plantar Foot 56.7 cm  20 cm prox to lateral malleoli   10 cm Proximal to Floor at Lateral Malleoli 46.8 cm  this was 10 cm prox to lat malleoli   Circumference of ankle/heel 32.3 cm.   5 cm Proximal to 1st MTP Joint 23.2 cm   Across MTP Joint 21.5 cm   Around Proximal Great Toe 7.7 cm                [facility] Adult PT Treatment/Exercise - 03/09/16 0001    Manual Therapy   Manual Therapy Manual Lymphatic Drainage (MLD)   Manual therapy comments bilateral LEs   Manual Lymphatic Drainage (MLD) short neck, superficial and deep abdomen, stimulation of left axillary nodes and establishment of inguino-axillary pathway, LLE working from thigh distally to foot moving fluid laterally and directing towards pathway, repeated on right side                PT Education - 03/09/16 1716    Education provided Yes   Education Details anatomy and physiology of lymphatic system, importance of MLD and bandaging   Person(s) Educated Patient   Methods Explanation   Comprehension Verbalized understanding           Short Term Clinic Goals - 03/09/16 1723    CC Short Term Goal  #1   Title Pt will demonstrate a 2 cm reduction 10 cm proximal to lateral malleolus on LLE to decrease risk of cellulitis   Baseline ** notice measurements was taken 10 cm proximal to lateral mallolus - 46.8 cm   Time 4   Period Weeks   Status New   CC Short Term Goal  #2   Title Pt will demonstrate a  2 cm reduction at left popliteal crease to decrease risk of cellulitis   Baseline 54 cm   Time 4   Period Weeks   Status New   CC Short Term Goal  #3   Title Pt will demonstate a 2 cm decrease in edema 30 cm proximal to supra patella    Baseline 77 cm   Time 4   Period Weeks   Status New             Long Term Clinic Goals - 03/09/16 1730    CC Long Term Goal  #1   Title Pt will demonstrate a 4 cm decrease in circumference 10 cm proximal to left lateral malleolus to decrease risk of cellulitis   Baseline ** notice measurements taken 10 cm proximal to lateral malleolus not at floor 46.8 cm   Time 8   Period Weeks   Status New   CC Long Term Goal  #2   Title Pt will demonstrate a 3 cm decrease in edema at left popliteal crease to improve mobility   Baseline 54   Time 8   Period Weeks   Status New   CC Long Term Goal  #3   Title Pt will demonstrate a 4 cm reduction in edema 30 cm proximal to suprapatella to decrease risk of cellulitis   Baseline 77 cm   Period Weeks   Status New   CC Long Term Goal  #4   Title Pt will be fitted  with an appropriate compression garment for long term management of edema   Time 8   Period Weeks   Status New   CC Long Term Goal  #5   Title Pt will demonstrate improved skin texture as noted by increased mobility of skin of left lower leg and decreased redness   Time 8   Period Weeks   Status New   CC Long Term Goal  #6   Title Pt will improve LLIS score to less than 20 percent impairment   Baseline 40   Time 8   Period Weeks   Status New   Additional Goals   Additional Goals Yes            Plan - 04/03/16 1718    Clinical Impression Statement Pt with a history of lymphedema beginning over 50 years ago. Initially swelling came and went but over the past 4 years pt has had swelling that has stayed and not improved. Pt reports it is difficult to find clothes that fit and she was treated for cellulitis in October of 2016. Pt very  motivated to get her swelling under control. She has two pairs of compresion stockings but both are 15-22mmHg and are not enough support. She also has increased weakness in bilateral LEs and would like to increase in bilateral LE strength. Pt very motivated to participate in therapy. Primary focus is to reduce edema then focus can be placed on bilateral LE strengthening.   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   PT Frequency 3x / week   PT Duration 8 weeks   PT Treatment/Interventions Compression bandaging;Manual techniques;Manual lymph drainage;Therapeutic exercise;Therapeutic activities   PT Next Visit Plan MLD to bilateral LEs, bandaging to LLE from foot to groin   PT Home Exercise Plan sit to stands from chair attempting to use only 1 hand or no hands   Consulted and Agree with Plan of Care Patient          G-Codes - 03-Apr-2016 1735    Functional Limitation Mobility: Walking and moving around   Mobility: Walking and Moving Around Current Status [phone]) At least 40 percent but less than 60 percent impaired, limited or restricted   Mobility: Walking and Moving Around Goal Status [phone]) At least 20 percent but less than 40 percent impaired, limited or restricted       Problem List Patient Active Problem List   Diagnosis Date Noted  . Morbid obesity ([place]) 10/08/2014  . Vasculitis ([place]) 10/08/2014  . Essential hypertension 10/08/2014  . Swelling of limb 02/18/2014  . Lymphedema 02/18/2014  . Mixed hyperlipidemia 05/14/2008  . COUGH 05/14/2008    Alexia Freestone 04-03-16, 5:41 PM  [place], [place], [address] Phone: [phone]   Fax:  [phone]  Name: Alexandra Williams MRN:  [id number] Date of Birth: May 28, 1941   Allyson Sabal, PT 04-03-16 5:42 PM

## 2016-03-09 NOTE — Telephone Encounter (Signed)
Patient returned call. She is given pathology results from Dr. Sabra Heck and verbalizes understanding of results.   Patient was contacted by Kalman Shan at [facility] and they have scheduled a new patient appointment with Mrs. Konja today. She would like to keep this appointment and then based on what occurs at this appointment today she will call us to let us know to cancel the appointment in [place] or not. She expresses thanks to Dr. Sabra Heck and our office for her assistance with this as she states she has tried with many doctors to obtain the same referral and was unable to get appointments.   Routing to provider for final review. Patient agreeable to disposition. Will close encounter.

## 2016-03-10 ENCOUNTER — Telehealth: Payer: Self-pay | Admitting: Emergency Medicine

## 2016-03-10 DIAGNOSIS — L501 Idiopathic urticaria: Secondary | ICD-10-CM | POA: Diagnosis not present

## 2016-03-10 NOTE — Telephone Encounter (Signed)
Patient returned call and states that her appointment yesterday at the lymphedema clinic went very well and they have accepted her as a patient.  She again thanks Dr. Sabra Heck for her assistance with this.  I have cancelled her appointment at [facility] at this time and patient is advised to call back if she needs anything further.  Routing to provider for final review. Patient agreeable to disposition. Will close encounter.

## 2016-03-14 ENCOUNTER — Ambulatory Visit: Payer: Medicare Other | Admitting: Physical Therapy

## 2016-03-14 DIAGNOSIS — M6281 Muscle weakness (generalized): Secondary | ICD-10-CM

## 2016-03-14 DIAGNOSIS — I890 Lymphedema, not elsewhere classified: Secondary | ICD-10-CM | POA: Diagnosis not present

## 2016-03-15 NOTE — Therapy (Signed)
[place], [place], [address] Phone: [phone]   Fax:  [phone]  Physical Therapy Treatment  Patient Details  Name: Alexandra Williams MRN: [id number] Date of Birth: 04/30/1941 Referring Provider: [name]  Encounter Date: 03/14/2016      PT End of Session - 03/15/16 1321    Visit Number 2   Number of Visits 25   Date for PT Re-Evaluation 05/04/16   PT Start Time 1440   PT Stop Time 1610   PT Time Calculation (min) 90 min   Activity Tolerance Patient tolerated treatment well   Behavior During Therapy [facility] for tasks assessed/performed      Past Medical History  Diagnosis Date  . Abnormal Pap smear 4/82    colpo/ecc negative  . Fibrocystic breast   . Uterine fibroid   . Cervical polyp 1/99  . Postmenopausal bleeding 3/99    on HRT/endometrial biopsy-focal simple hyperplasia  . Colon polyps   . Hypertension   . Elevated lipids   . Vitamin D deficiency   . Arthritis     knee  . Hives     undetermined-on 7 antihistamines  . Hyperlipidemia   . Vasculitis ([place])     bilat legs    Past Surgical History  Procedure Laterality Date  . Tonsillectomy and adenoidectomy      as a child  . Incision and drainage      Bartholin cyst    There were no vitals filed for this visit.  Visit Diagnosis:  Lymphedema of both lower extremities  Muscle weakness (generalized)      Subjective Assessment - 03/15/16 1319    Subjective Pt is prepared for complete decongestive therapy today-she brought other shoes and a skirt.    Pertinent History Pt reports she began having swelling Williams 50 years ago when she was pregnant. She had her first cellulitis infection at that time. Her leg swelling would come and go until the past three to four years it has not gotten any better. Pt was treated for a cellulitis infection in October of 2016. Pt reports she does not have any heart or kidney problems, no family  history of lymphedema, pt is in need of knee replacements and reports her knees buckle occasionally and that is why she uses a cane   Patient Stated Goals to get the fluid out of my legs and to strengthen my legs, be more comfortable when I walk   Currently in Pain? No/denies                         [facility] Adult PT Treatment/Exercise - 03/15/16 0001    Manual Therapy   Manual Therapy Manual Lymphatic Drainage (MLD)   Manual therapy comments bilateral LEs   Manual Lymphatic Drainage (MLD) short neck, superficial and deep abdomen, stimulation of left axillary nodes and establishment of inguino-axillary pathway, LLE working from thigh distally to foot moving fluid laterally and directing towards pathway, repeated on right side   Compression Bandaging to left leg. Biotone, 2 pieces of thick stockineer, elsasomull to toed 1-4, 3 artiflex with patch of thin foam behind knee, 1-8, 1-10, 6-12 cm bandages from foot to thigh                 PT Education - 03/15/16 1321    Education provided Yes   Education Details try to keep bandages on til just before her return to use in  2 days.  Ok to remove them if they slide down or are too painful.. Bring all bandages back to nex visit.    Person(s) Educated Patient   Methods Explanation   Comprehension Verbalized understanding           Short Term Clinic Goals - 03/09/16 1723    CC Short Term Goal  #1   Title Pt will demonstrate a 2 cm reduction 10 cm proximal to lateral malleolus on LLE to decrease risk of cellulitis   Baseline ** notice measurements was taken 10 cm proximal to lateral mallolus - 46.8 cm   Time 4   Period Weeks   Status New   CC Short Term Goal  #2   Title Pt will demonstrate a 2 cm reduction at left popliteal crease to decrease risk of cellulitis   Baseline 54 cm   Time 4   Period Weeks   Status New   CC Short Term Goal  #3   Title Pt will demonstate a 2 cm decrease in edema 30 cm proximal to supra  patella    Baseline 77 cm   Time 4   Period Weeks   Status New             Long Term Clinic Goals - 03/09/16 1730    CC Long Term Goal  #1   Title Pt will demonstrate a 4 cm decrease in circumference 10 cm proximal to left lateral malleolus to decrease risk of cellulitis   Baseline ** notice measurements taken 10 cm proximal to lateral malleolus not at floor 46.8 cm   Time 8   Period Weeks   Status New   CC Long Term Goal  #2   Title Pt will demonstrate a 3 cm decrease in edema at left popliteal crease to improve mobility   Baseline 54   Time 8   Period Weeks   Status New   CC Long Term Goal  #3   Title Pt will demonstrate a 4 cm reduction in edema 30 cm proximal to suprapatella to decrease risk of cellulitis   Baseline 77 cm   Period Weeks   Status New   CC Long Term Goal  #4   Title Pt will be fitted with an appropriate compression garment for long term management of edema   Time 8   Period Weeks   Status New   CC Long Term Goal  #5   Title Pt will demonstrate improved skin texture as noted by increased mobility of skin of left lower leg and decreased redness   Time 8   Period Weeks   Status New   CC Long Term Goal  #6   Title Pt will improve LLIS score to less than 20 percent impairment   Baseline 40   Time 8   Period Weeks   Status New   Additional Goals   Additional Goals Yes            Plan - 03/15/16 1322    Clinical Impression Statement Pt attentive thoughout treatment, asked appropriate questions and is beginning to understand treament process  Some softening of fluid was noted during manual lymph drainage so anticipate she will receive reduction with compression. 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 Morbid obesity ([place]) 10/08/2014  . Vasculitis ([place]) 10/08/2014  . Essential hypertension 10/08/2014  . Swelling of limb 02/18/2014  . Lymphedema 02/18/2014  . Mixed hyperlipidemia 05/14/2008  . COUGH 05/14/2008   Donato Heinz. Owens Shark, PT  03/15/2016, 1:26 PM  [place], [place], [address] Phone: [phone]   Fax:  [phone]  Name: Alexandra Williams MRN: [id number] Date of Birth: [birth year]

## 2016-03-16 ENCOUNTER — Ambulatory Visit: Payer: Medicare Other | Admitting: Physical Therapy

## 2016-03-16 DIAGNOSIS — I890 Lymphedema, not elsewhere classified: Secondary | ICD-10-CM | POA: Diagnosis not present

## 2016-03-16 DIAGNOSIS — M6281 Muscle weakness (generalized): Secondary | ICD-10-CM | POA: Diagnosis not present

## 2016-03-16 NOTE — Therapy (Signed)
[place], [place], [address] Phone: [phone]   Fax:  [phone]  Physical Therapy Treatment  Patient Details  Name: KARINNE STUVER MRN: [id number] Date of Birth: 28-Aug-1941 Referring Provider: [name]  Encounter Date: 03/16/2016      PT End of Session - 03/16/16 1808    Visit Number 3   Number of Visits 25   Date for PT Re-Evaluation 05/04/16   PT Start Time 1435   PT Stop Time 1605   PT Time Calculation (min) 90 min   Activity Tolerance Patient tolerated treatment well   Behavior During Therapy [facility] for tasks assessed/performed      Past Medical History  Diagnosis Date  . Abnormal Pap smear 4/82    colpo/ecc negative  . Fibrocystic breast   . Uterine fibroid   . Cervical polyp 1/99  . Postmenopausal bleeding 3/99    on HRT/endometrial biopsy-focal simple hyperplasia  . Colon polyps   . Hypertension   . Elevated lipids   . Vitamin D deficiency   . Arthritis     knee  . Hives     undetermined-on 7 antihistamines  . Hyperlipidemia   . Vasculitis ([place])     bilat legs    Past Surgical History  Procedure Laterality Date  . Tonsillectomy and adenoidectomy      as a child  . Incision and drainage      Bartholin cyst    There were no vitals filed for this visit.  Visit Diagnosis:  Lymphedema of both lower extremities      Subjective Assessment - 03/16/16 1440    Subjective Pt had to take some prednisone as she has exacerbation of hives and they improved.  She was able to keep bandages on unil this morning and feels like she has had intiial improvement    Pertinent History Pt reports she began having swelling over 50 years ago when she was pregnant. She had her first cellulitis infection at that time. Her leg swelling would come and go until the past three to four years it has not gotten any better. Pt was treated for a cellulitis infection in October of 2016. Pt reports  she does not have any heart or kidney problems, no family history of lymphedema, pt is in need of knee replacements and reports her knees buckle occasionally and that is why she uses a cane   Patient Stated Goals to get the fluid out of my legs and to strengthen my legs, be more comfortable when I walk               LYMPHEDEMA/ONCOLOGY QUESTIONNAIRE - 03/16/16 1506    Right Lower Extremity Lymphedema   20 cm Proximal to Suprapatella 72 cm   10 cm Proximal to Suprapatella 64.8 cm   At Midpatella/Popliteal Crease 54.5 cm  at popliteal crease    30 cm Proximal to Floor at Lateral Plantar Foot 57.8 cm  30 cm proximal to lateral malleolus   20 cm Proximal to Floor at Lateral Plantar Foot 54 1  10 cm proxmal to lateral malleolus   10 cm Proximal to Floor at Lateral Malleoli 44.6 cm  10 cm proximal to lateral malleolus   5 cm Proximal to 1st MTP Joint 24.2 cm   Around Proximal Great Toe 7.7 cm   Left Lower Extremity Lymphedema   20 cm Proximal to Suprapatella 70 cm   10 cm Proximal to Suprapatella 65.5 cm  At [place] 56 cm  at popliteal crease    30 cm Proximal to Floor at Lateral Plantar Foot 57 cm  30cm prox to lat meollus    20 cm Proximal to Floor at Lateral Plantar Foot 54.3 cm  20 cm prox to laeral malleolus    10 cm Proximal to Floor at Lateral Malleoli 44 cm  10cm prox to lat malleolus   5 cm Proximal to 1st MTP Joint 22.9 cm   Around Proximal Great Toe 7.5 cm                  [facility] Adult PT Treatment/Exercise - 03/16/16 0001    Self-Care   Other Self-Care Comments  Reviewed options for day and nighttime garments and gave pt a copy form catalog for [employer] graduate as nighttime option. Also reviewed compression pump options and gave pt sheet to review Biocompression and Tactil entre pump   Manual Therapy   Manual Therapy Manual Lymphatic Drainage (MLD)   Manual therapy comments bilateral LEs   Manual Lymphatic Drainage (MLD) short neck,  superficial and deep abdomen, stimulation of left axillary nodes and establishment of inguino-axillary pathway, LLE working from thigh distally to foot moving fluid laterally and directing towards pathway, repeated on right side   Compression Bandaging to left leg. Biotone, 2 pieces of thick stockineer, elsasomull to toed 1-4, thick foam palced around akle with cut out for malleoli 3 artiflex with patch of thin foam behind knee, 1-8, 1-10, 6-12 cm bandages from foot to thigh                 PT Education - 03/16/16 1807    Education provided Yes   Education Details see self care section    Person(s) Educated Patient   Methods Explanation;Handout   Comprehension Verbalized understanding           Short Term Clinic Goals - 03/16/16 1811    CC Short Term Goal  #1   Title Pt will demonstrate a 2 cm reduction 10 cm proximal to lateral malleolus on LLE to decrease risk of cellulitis   Baseline ** notice measurements was taken 10 cm proximal to lateral mallolus - 46.8 cm   Status Achieved   CC Short Term Goal  #2   Title Pt will demonstrate a 2 cm reduction at left popliteal crease to decrease risk of cellulitis   Status On-going   CC Short Term Goal  #3   Title Pt will demonstate a 2 cm decrease in edema 30 cm proximal to supra patella    Status On-going             [facility] Goals - 03/09/16 1730    CC Long Term Goal  #1   Title Pt will demonstrate a 4 cm decrease in circumference 10 cm proximal to left lateral malleolus to decrease risk of cellulitis   Baseline ** notice measurements taken 10 cm proximal to lateral malleolus not at floor 46.8 cm   Time 8   Period Weeks   Status New   CC Long Term Goal  #2   Title Pt will demonstrate a 3 cm decrease in edema at left popliteal crease to improve mobility   Baseline 54   Time 8   Period Weeks   Status New   CC Long Term Goal  #3   Title Pt will demonstrate a 4 cm reduction in edema 30 cm proximal to  suprapatella to decrease risk of  cellulitis   Baseline 77 cm   Period Weeks   Status New   CC Long Term Goal  #4   Title Pt will be fitted with an appropriate compression garment for long term management of edema   Time 8   Period Weeks   Status New   CC Long Term Goal  #5   Title Pt will demonstrate improved skin texture as noted by increased mobility of skin of left lower leg and decreased redness   Time 8   Period Weeks   Status New   CC Long Term Goal  #6   Title Pt will improve LLIS score to less than 20 percent impairment   Baseline 40   Time 8   Period Weeks   Status New   Additional Goals   Additional Goals Yes            Plan - 03/16/16 1808    Clinical Impression Statement Pt had reductions afer intial bandaging.  Discussed at length options for extremity pump and nighttime garment as well as daytime options.  Pt will research and decide with goal of intiaing pump process next week.    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 Morbid obesity ([place]) 10/08/2014  . Vasculitis ([place]) 10/08/2014  . Essential hypertension 10/08/2014  . Swelling of limb 02/18/2014  . Lymphedema 02/18/2014  . Mixed hyperlipidemia 05/14/2008  . COUGH 05/14/2008   Donato Heinz. Owens Shark, PT  03/16/2016, [place], [place], [address] Phone: [phone]   Fax:  [phone]  Name: MAKENZII WALDRUM MRN: [id number] Date of Birth: 30-Aug-1941

## 2016-03-21 ENCOUNTER — Ambulatory Visit: Payer: Medicare Other | Attending: Obstetrics & Gynecology | Admitting: Physical Therapy

## 2016-03-21 ENCOUNTER — Other Ambulatory Visit: Payer: Self-pay | Admitting: Obstetrics and Gynecology

## 2016-03-21 DIAGNOSIS — I890 Lymphedema, not elsewhere classified: Secondary | ICD-10-CM | POA: Diagnosis not present

## 2016-03-21 DIAGNOSIS — M6281 Muscle weakness (generalized): Secondary | ICD-10-CM | POA: Insufficient documentation

## 2016-03-21 NOTE — Patient Instructions (Signed)
Deep Effective Breath   Standing, sitting, or laying down place both hands on the belly. Take a deep breath IN, expanding the belly; then breath OUT, contracting the belly. Repeat __5__ times. Do __2-3__ sessions per day and before each self massage.  http://gt2.exer.us/866   Copyright  VHI. All rights reserved.  Inguinal Nodes to Axilla - Clear   On involved side, at armpit, make _5__ in-place circles. Then from hip proceed in sections to armpit with stationary circles or pumps _5_ times, this is your pathway. Do _1__ time per day.  Copyright  VHI. All rights reserved.  LEG: Knee to Hip - Clear   Pump up outer thigh of involved leg from knee to outer hip. Then do stationary circles from inner to outer thigh, then do outer thigh again. Next, interlace fingers behind knee IF ABLE and make in-place circles. Do _5_ times of each sequence.  Do _1__ time per day.  Copyright  VHI. All rights reserved.  LEG: Ankle to Hip Sweep   Hands on sides of ankle of involved leg, pump _5__ times up both sides of lower leg, then retrace steps up outer thigh to hip as before and back to pathway. Do _2-3_ times. Do __1_ time per day.  Copyright  VHI. All rights reserved.  FOOT: Dorsum of Foot and Toes Massage   One hand on top of foot make _5_ stationary circles or pumps, then either on top of toes or each individual toe do _5_ pumps. Then retrace all steps pumping back up both sides of lower leg, outer thigh, and then pathway. Finish with what you started with, _5_ circles at involved side arm pit. All _2-3_ times at each sequence. Do _1__ time per day.  Copyright  VHI. All rights reserved.   .mld

## 2016-03-21 NOTE — Telephone Encounter (Signed)
Medication refill request: Ambien Last AEX: 10-12-15   Next AEX: 10-13-16 Last MMG (if hormonal medication request): 02/07/16 U/S WNL  Refill authorized: please advise

## 2016-03-21 NOTE — Telephone Encounter (Addendum)
Medication refill request: Ambien 5 mg  Last AEX:  10/12/2015 with PG Next AEX: 10/13/16 with PG  Last MMG (if hormonal medication request): n/a Refill authorized: #30?

## 2016-03-21 NOTE — Therapy (Signed)
[place], [place], [address] Phone: [phone]   Fax:  [phone]  Physical Therapy Treatment  Patient Details  Name: Alexandra Williams MRN: [id number] Date of Birth: 1941/11/12 Referring Provider: [name]  Encounter Date: 03/21/2016      PT End of Session - 03/21/16 1656    Visit Number 4   Number of Visits 25   Date for PT Re-Evaluation 05/04/16   PT Start Time 1430   PT Stop Time 1600   PT Time Calculation (min) 90 min   Activity Tolerance Patient tolerated treatment well   Behavior During Therapy [facility] for tasks assessed/performed      Past Medical History  Diagnosis Date  . Abnormal Pap smear 4/82    colpo/ecc negative  . Fibrocystic breast   . Uterine fibroid   . Cervical polyp 1/99  . Postmenopausal bleeding 3/99    on HRT/endometrial biopsy-focal simple hyperplasia  . Colon polyps   . Hypertension   . Elevated lipids   . Vitamin D deficiency   . Arthritis     knee  . Hives     undetermined-on 7 antihistamines  . Hyperlipidemia   . Vasculitis ([place])     bilat legs    Past Surgical History  Procedure Laterality Date  . Tonsillectomy and adenoidectomy      as a child  . Incision and drainage      Bartholin cyst    There were no vitals filed for this visit.  Visit Diagnosis:  Lymphedema of both lower extremities  Lymphedema, not elsewhere classified      Subjective Assessment - 03/21/16 1438    Subjective pt said she took the bandages off on Friday.  and she says even now "Its definitely not as swollen as it was"  She has not made a decision about the compressin pump yet    Pertinent History Pt reports she began having swelling over 50 years ago when she was pregnant. She had her first cellulitis infection at that time. Her leg swelling would come and go until the past three to four years it has not gotten any better. Pt was treated for a cellulitis infection in  October of 2016. Pt reports she does not have any heart or kidney problems, no family history of lymphedema, pt is in need of knee replacements and reports her knees buckle occasionally and that is why she uses a cane   Patient Stated Goals to get the fluid out of my legs and to strengthen my legs, be more comfortable when I walk                         [facility] Adult PT Treatment/Exercise - 03/21/16 0001    Self-Care   Self-Care Other Self-Care Comments   Other Self-Care Comments  instructed in diaphragmatic breathing and self manual lymph drainage for right leg.   Knee/Hip Exercises: Supine   Quad Sets AROM;Both;5 reps   Straight Leg Raises AROM;Both;5 reps   Other Supine Knee/Hip Exercises knee chest with each leg    Other Supine Knee/Hip Exercises "dead bug" with alternating arm and leg    Manual Therapy   Manual Therapy Manual Lymphatic Drainage (MLD)   Manual therapy comments bilateral LEs   Manual Lymphatic Drainage (MLD) short neck, superficial and deep abdomen, stimulation of left axillary nodes and establishment of inguino-axillary pathway, LLE working from thigh distally to foot moving fluid laterally  and directing towards pathway, repeated on right side   Compression Bandaging to left leg. Biotone, 2 pieces of thick stockineer, elsasomull to toed 1-4, thick foam palced around akle with cut out for malleoli 3 artiflex with patch of thin foam behind knee, 1-8, 1-10, 6-12 cm bandages from foot to thigh                    Short Term Clinic Goals - 03/16/16 1811    CC Short Term Goal  #1   Title Pt will demonstrate a 2 cm reduction 10 cm proximal to lateral malleolus on LLE to decrease risk of cellulitis   Baseline ** notice measurements was taken 10 cm proximal to lateral mallolus - 46.8 cm   Status Achieved   CC Short Term Goal  #2   Title Pt will demonstrate a 2 cm reduction at left popliteal crease to decrease risk of cellulitis   Status On-going   CC  Short Term Goal  #3   Title Pt will demonstate a 2 cm decrease in edema 30 cm proximal to supra patella    Status On-going             Long Term Clinic Goals - 03/09/16 1730    CC Long Term Goal  #1   Title Pt will demonstrate a 4 cm decrease in circumference 10 cm proximal to left lateral malleolus to decrease risk of cellulitis   Baseline ** notice measurements taken 10 cm proximal to lateral malleolus not at floor 46.8 cm   Time 8   Period Weeks   Status New   CC Long Term Goal  #2   Title Pt will demonstrate a 3 cm decrease in edema at left popliteal crease to improve mobility   Baseline 54   Time 8   Period Weeks   Status New   CC Long Term Goal  #3   Title Pt will demonstrate a 4 cm reduction in edema 30 cm proximal to suprapatella to decrease risk of cellulitis   Baseline 77 cm   Period Weeks   Status New   CC Long Term Goal  #4   Title Pt will be fitted with an appropriate compression garment for long term management of edema   Time 8   Period Weeks   Status New   CC Long Term Goal  #5   Title Pt will demonstrate improved skin texture as noted by increased mobility of skin of left lower leg and decreased redness   Time 8   Period Weeks   Status New   CC Long Term Goal  #6   Title Pt will improve LLIS score to less than 20 percent impairment   Baseline 40   Time 8   Period Weeks   Status New   Additional Goals   Additional Goals Yes            Plan - 03/21/16 1656    Clinical Impression Statement Pt continues with lymphedema in both legs .She is  pleased that she is having some success with bandaging even for a short time.  She got a shoe today to more easily accommodate bandages. She seems to be tolerating bandages well.  May be ready to progress to circaid type of gament in a  week or so to prepare for custom garments for legs. Pt feels that she should have a decision for compression pump soon. 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    Consulted and Agree with Plan of Care Patient        Problem List Patient Active Problem List   Diagnosis Date Noted  . Morbid obesity ([place]) 10/08/2014  . Vasculitis ([place]) 10/08/2014  . Essential hypertension 10/08/2014  . Swelling of limb 02/18/2014  . Lymphedema 02/18/2014  . Mixed hyperlipidemia 05/14/2008  . COUGH 05/14/2008      Donato Heinz. Owens Shark, PT  03/21/2016, [place], [place], [address] Phone: [phone]   Fax:  [phone]  Name: Alexandra Williams MRN: [id number] Date of Birth: 1941/10/13

## 2016-03-22 NOTE — Telephone Encounter (Signed)
Rx printed signed by Dr. Talbert Nan for Dr. Sabra Heck and faxed to [facility].

## 2016-03-23 ENCOUNTER — Ambulatory Visit: Payer: Medicare Other | Admitting: Physical Therapy

## 2016-03-23 ENCOUNTER — Ambulatory Visit: Payer: Medicare Other | Admitting: Occupational Therapy

## 2016-03-23 DIAGNOSIS — M6281 Muscle weakness (generalized): Secondary | ICD-10-CM

## 2016-03-23 DIAGNOSIS — I890 Lymphedema, not elsewhere classified: Secondary | ICD-10-CM | POA: Diagnosis not present

## 2016-03-24 NOTE — Therapy (Signed)
[place], [place], [address] Phone: [phone]   Fax:  [phone]  Physical Therapy Treatment  Patient Details  Name: Alexandra Williams MRN: [id number] Date of Birth: 09-06-1941 Referring Provider: [name]  Encounter Date: 03/23/2016      PT End of Session - 03/24/16 0744    Visit Number 5   Number of Visits 25   Date for PT Re-Evaluation 05/04/16   PT Start Time 1344   PT Stop Time 1428   PT Time Calculation (min) 44 min   Activity Tolerance Patient tolerated treatment well   Behavior During Therapy [facility] for tasks assessed/performed      Past Medical History  Diagnosis Date  . Abnormal Pap smear 4/82    colpo/ecc negative  . Fibrocystic breast   . Uterine fibroid   . Cervical polyp 1/99  . Postmenopausal bleeding 3/99    on HRT/endometrial biopsy-focal simple hyperplasia  . Colon polyps   . Hypertension   . Elevated lipids   . Vitamin D deficiency   . Arthritis     knee  . Hives     undetermined-on 7 antihistamines  . Hyperlipidemia   . Vasculitis ([place])     bilat legs    Past Surgical History  Procedure Laterality Date  . Tonsillectomy and adenoidectomy      as a child  . Incision and drainage      Bartholin cyst    There were no vitals filed for this visit.      Subjective Assessment - 03/23/16 1303    Subjective Pt says she took the bandages off this morning.  She developed some itching under the bandages about 24 hours after they were applied.  Hives seem to be at areas of higher tightness.    Pertinent History Pt reports she began having swelling over 50 years ago when she was pregnant. She had her first cellulitis infection at that time. Her leg swelling would come and go until the past three to four years it has not gotten any better. Pt was treated for a cellulitis infection in October of 2016. Pt reports she does not have any heart or kidney problems, no family  history of lymphedema, pt is in need of knee replacements and reports her knees buckle occasionally and that is why she uses a cane   Patient Stated Goals to get the fluid out of my legs and to strengthen my legs, be more comfortable when I walk   Currently in Pain? No/denies  just itching pt is using topical gels             [facility] PT Assessment - 03/24/16 0001    Observation/Other Assessments   Skin Integrity pt with area of pink raised "hives" that she has been treating topically at left inner thign anc across central thight that she says is wear the bandage was bunched up as it slid down.  Also red area at right leg below knee where her compression stocking was rolled            LYMPHEDEMA/ONCOLOGY QUESTIONNAIRE - 03/23/16 1311    Left Lower Extremity Lymphedema   30 cm Proximal to Suprapatella 84 cm   20 cm Proximal to Suprapatella 72 cm   10 cm Proximal to Suprapatella 66 cm   At Midpatella/Popliteal Crease 54.5 cm  popliteal crease    30 cm Proximal to Floor at Lateral Plantar Foot 54.5 cm  from top of  prox lat malleolus   20 cm Proximal to Floor at Lateral Plantar Foot 51.4 cm  from poximal lat malleolus   10 cm Proximal to Floor at Lateral Malleoli 41 cm  from proximal lat malleolus    5 cm Proximal to 1st MTP Joint 22.4 cm   Around Proximal Great Toe 7.5 cm                  [facility] Adult PT Treatment/Exercise - 03/24/16 0001    Manual Therapy   Manual Therapy Manual Lymphatic Drainage (MLD)   Manual Lymphatic Drainage (MLD) short neck, superficial and deep abdomen, stimulation of left axillary nodes and establishment of inguino-axillary pathway, LLE working from thigh distally to foot moving fluid laterally and directing towards pathway,    Compression Bandaging to left leg. Biotone,to lower leg avoiding area of hives 2 pieces of thick stockineer, elsasomull to toed 1-4, thick foam palced around akle with cut out for malleoli 3 artiflex 1-8, 1-10, 3-12 cm  bandages from foot to knee with one in [place]. Gave pt golds gym band to place around upper thigh over thick stockinette to see if it would keep it from rolling and allow for easy removal if pt experienced itching                     Short Term Clinic Goals - 03/24/16 0748    CC Short Term Goal  #1   Title Pt will demonstrate a 2 cm reduction 10 cm proximal to lateral malleolus on LLE to decrease risk of cellulitis   Baseline ** notice measurements was taken 10 cm proximal to lateral mallolus - 46.8 cm,  41cm 03/24/2016   Time 4   Status Achieved   CC Short Term Goal  #2   Title Pt will demonstrate a 2 cm reduction at left popliteal crease to decrease risk of cellulitis   Baseline 54 cm, 54.5 at  4/7 /2017   Status On-going   CC Short Term Goal  #3   Title Pt will demonstate a 2 cm decrease in edema 30 cm proximal to supra patella    Status On-going             Long Term Clinic Goals - 03/09/16 1730    CC Long Term Goal  #1   Title Pt will demonstrate a 4 cm decrease in circumference 10 cm proximal to left lateral malleolus to decrease risk of cellulitis   Baseline ** notice measurements taken 10 cm proximal to lateral malleolus not at floor 46.8 cm   Time 8   Period Weeks   Status New   CC Long Term Goal  #2   Title Pt will demonstrate a 3 cm decrease in edema at left popliteal crease to improve mobility   Baseline 54   Time 8   Period Weeks   Status New   CC Long Term Goal  #3   Title Pt will demonstrate a 4 cm reduction in edema 30 cm proximal to suprapatella to decrease risk of cellulitis   Baseline 77 cm   Period Weeks   Status New   CC Long Term Goal  #4   Title Pt will be fitted with an appropriate compression garment for long term management of edema   Time 8   Period Weeks   Status New   CC Long Term Goal  #5   Title Pt will demonstrate improved skin texture as noted by increased mobility of  skin of left lower leg and decreased redness   Time 8    Period Weeks   Status New   CC Long Term Goal  #6   Title Pt will improve LLIS score to less than 20 percent impairment   Baseline 40   Time 8   Period Weeks   Status New   Additional Goals   Additional Goals Yes            Plan - 03/24/16 0744    Clinical Impression Statement Pt is showing nice reductions in left lower leg from compression wrapping, but experienced some "hives" at upper thigh possilbly related to areas that bandage has slid down and bunched.  Tried alternate ways to hold bandage up.  Pt to get another golds gym band to see if it will hold lobule on left inner thigh just below knee that is a potential  trouble spot.    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  Initiate compression pump when pt decides   Remeasure on midweek shorly after bandages have been removed.    Consulted and Agree with Plan of Care Patient      Patient will benefit from skilled therapeutic intervention in order to improve the following deficits and impairments:  Decreased strength, Increased edema  Visit Diagnosis: Lymphedema, not elsewhere classified  Muscle weakness (generalized)     Problem List Patient Active Problem List   Diagnosis Date Noted  . Morbid obesity ([place]) 10/08/2014  . Vasculitis ([place]) 10/08/2014  . Essential hypertension 10/08/2014  . Swelling of limb 02/18/2014  . Lymphedema 02/18/2014  . Mixed hyperlipidemia 05/14/2008  . COUGH 05/14/2008   Donato Heinz. Owens Shark, PT  03/24/2016, 7:52 AM  [place], [place], [address] Phone: [phone]   Fax:  [phone]  Name: DENIJAH WELLS MRN: [id number] Date of Birth: [birth year]

## 2016-03-27 ENCOUNTER — Ambulatory Visit: Payer: Medicare Other | Admitting: Physical Therapy

## 2016-03-27 ENCOUNTER — Encounter: Payer: Self-pay | Admitting: Physical Therapy

## 2016-03-27 DIAGNOSIS — M6281 Muscle weakness (generalized): Secondary | ICD-10-CM

## 2016-03-27 DIAGNOSIS — I890 Lymphedema, not elsewhere classified: Secondary | ICD-10-CM

## 2016-03-27 NOTE — Therapy (Signed)
[place], [place], [address] Phone: [phone]   Fax:  [phone]  Physical Therapy Treatment  Patient Details  Name: Alexandra Williams MRN: [id number] Date of Birth: May 12, 1941 Referring Provider: [name]  Encounter Date: 03/27/2016      PT End of Session - 03/27/16 1702    Visit Number --   Number of Visits --   Date for PT Re-Evaluation --      Past Medical History  Diagnosis Date  . Abnormal Pap smear 4/82    colpo/ecc negative  . Fibrocystic breast   . Uterine fibroid   . Cervical polyp 1/99  . Postmenopausal bleeding 3/99    on HRT/endometrial biopsy-focal simple hyperplasia  . Colon polyps   . Hypertension   . Elevated lipids   . Vitamin D deficiency   . Arthritis     knee  . Hives     undetermined-on 7 antihistamines  . Hyperlipidemia   . Vasculitis ([place])     bilat legs    Past Surgical History  Procedure Laterality Date  . Tonsillectomy and adenoidectomy      as a child  . Incision and drainage      Bartholin cyst    There were no vitals filed for this visit.      Subjective Assessment - 03/27/16 1355    Subjective The hives are doing better now. It seems to happen at the pressure points where the bandages stop. I took my bandages off on Saturday afternoon. Pt states using the band over her thigh helped but where the bandages ended on lower leg she did get hives.    Pertinent History Pt reports she began having swelling over 50 years ago when she was pregnant. She had her first cellulitis infection at that time. Her leg swelling would come and go until the past three to four years it has not gotten any better. Pt was treated for a cellulitis infection in October of 2016. Pt reports she does not have any heart or kidney problems, no family history of lymphedema, pt is in need of knee replacements and reports her knees buckle occasionally and that is why she uses a  cane   How long can you stand comfortably? 20 minutes   How long can you walk comfortably? 10 minutes   Patient Stated Goals to get the fluid out of my legs and to strengthen my legs, be more comfortable when I walk   Currently in Pain? No/denies                         [facility] Adult PT Treatment/Exercise - 03/27/16 0001    Manual Therapy   Manual Therapy Manual Lymphatic Drainage (MLD)   Manual Lymphatic Drainage (MLD) short neck, superficial and deep abdomen, stimulation of left axillary nodes and establishment of inguino-axillary pathway, LLE working from thigh distally to foot moving fluid laterally and directing towards pathway, repeated on right side   Compression Bandaging to left leg. Biotone,to lower leg avoiding area of hives 2 pieces of thick stockineer, elsasomull to toed 1-4, thick foam palced around akle with cut out for malleoli 3 artiflex 1-8, 1-10, 3-12 cm bandages from foot to knee with one in herringbone. Gave pt golds gym band to place around upper thigh over thick stockinette to see if it would keep it from rolling and allow for easy removal if pt experienced itching  PT Education - 03/27/16 1514    Education provided Yes   Education Details [employer] compression pump   Person(s) Educated Patient   Methods Explanation;Handout   Comprehension Verbalized understanding           Short Term Clinic Goals - 03/24/16 0748    CC Short Term Goal  #1   Title Pt will demonstrate a 2 cm reduction 10 cm proximal to lateral malleolus on LLE to decrease risk of cellulitis   Baseline ** notice measurements was taken 10 cm proximal to lateral mallolus - 46.8 cm,  41cm 03/24/2016   Time 4   Status Achieved   CC Short Term Goal  #2   Title Pt will demonstrate a 2 cm reduction at left popliteal crease to decrease risk of cellulitis   Baseline 54 cm, 54.5 at  4/7 /2017   Status On-going   CC Short Term Goal  #3   Title Pt will demonstate a 2  cm decrease in edema 30 cm proximal to supra patella    Status On-going             Long Term Clinic Goals - 03/09/16 1730    CC Long Term Goal  #1   Title Pt will demonstrate a 4 cm decrease in circumference 10 cm proximal to left lateral malleolus to decrease risk of cellulitis   Baseline ** notice measurements taken 10 cm proximal to lateral malleolus not at floor 46.8 cm   Time 8   Period Weeks   Status New   CC Long Term Goal  #2   Title Pt will demonstrate a 3 cm decrease in edema at left popliteal crease to improve mobility   Baseline 54   Time 8   Period Weeks   Status New   CC Long Term Goal  #3   Title Pt will demonstrate a 4 cm reduction in edema 30 cm proximal to suprapatella to decrease risk of cellulitis   Baseline 77 cm   Period Weeks   Status New   CC Long Term Goal  #4   Title Pt will be fitted with an appropriate compression garment for long term management of edema   Time 8   Period Weeks   Status New   CC Long Term Goal  #5   Title Pt will demonstrate improved skin texture as noted by increased mobility of skin of left lower leg and decreased redness   Time 8   Period Weeks   Status New   CC Long Term Goal  #6   Title Pt will improve LLIS score to less than 20 percent impairment   Baseline 40   Time 8   Period Weeks   Status New   Additional Goals   Additional Goals Yes            Plan - 03/27/16 1703    Clinical Impression Statement Pt is demonstrating reduction in left lower extremity secondary to compression wrapping. The abdominal binder helped with her thigh edema without causing hives. She was wrapped again from foot to knee and the abdominal binder was used to control thigh edema. Pt would benefit from continued skilled PT services to reach maximal reduction and then be fitted for compression garments.    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  Remeasure on midweek shorly after bandages have been removed.    Recommended Other Services facesheet sent to [facility] for a compression pump   Consulted and Agree with Plan of Care Patient      Patient will benefit from skilled therapeutic intervention in order to improve the following deficits and impairments:  Decreased strength, Increased edema  Visit Diagnosis: Lymphedema, not elsewhere classified - Plan: PT plan of care cert/re-cert  Muscle weakness (generalized) - Plan: PT plan of care cert/re-cert     Problem List Patient Active Problem List   Diagnosis Date Noted  . Morbid obesity ([place]) 10/08/2014  . Vasculitis ([place]) 10/08/2014  . Essential hypertension 10/08/2014  . Swelling of limb 02/18/2014  . Lymphedema 02/18/2014  . Mixed hyperlipidemia 05/14/2008  . COUGH 05/14/2008    Alexia Freestone 03/27/2016, 5:09 PM  [place], [place], [address] Phone: [phone]   Fax:  [phone]  Name: TYAJA STRAWDERMAN MRN: [id number] Date of Birth: May 06, 1941    Allyson Sabal, PT 03/27/2016 5:09 PM

## 2016-03-29 ENCOUNTER — Ambulatory Visit: Payer: Medicare Other

## 2016-03-29 DIAGNOSIS — I890 Lymphedema, not elsewhere classified: Secondary | ICD-10-CM | POA: Diagnosis not present

## 2016-03-29 DIAGNOSIS — M6281 Muscle weakness (generalized): Secondary | ICD-10-CM | POA: Diagnosis not present

## 2016-03-29 NOTE — Therapy (Signed)
[place], [place], [address] Phone: [phone]   Fax:  [phone]  Physical Therapy Treatment  Patient Details  Name: Alexandra Williams MRN: [id number] Date of Birth: 12-18-1941 Referring Provider: [name]  Encounter Date: 03/29/2016      PT End of Session - 03/29/16 1524    Visit Number 7   Number of Visits 25   Date for PT Re-Evaluation 05/04/16   PT Start Time 1435  Pt arrived very late   PT Stop Time 1520   PT Time Calculation (min) 45 min   Activity Tolerance Patient tolerated treatment well   Behavior During Therapy [facility] for tasks assessed/performed      Past Medical History  Diagnosis Date  . Abnormal Pap smear 4/82    colpo/ecc negative  . Fibrocystic breast   . Uterine fibroid   . Cervical polyp 1/99  . Postmenopausal bleeding 3/99    on HRT/endometrial biopsy-focal simple hyperplasia  . Colon polyps   . Hypertension   . Elevated lipids   . Vitamin D deficiency   . Arthritis     knee  . Hives     undetermined-on 7 antihistamines  . Hyperlipidemia   . Vasculitis ([place])     bilat legs    Past Surgical History  Procedure Laterality Date  . Tonsillectomy and adenoidectomy      as a child  . Incision and drainage      Bartholin cyst    There were no vitals filed for this visit.      Subjective Assessment - 03/29/16 1438    Subjective I'm sorry I'm late! This is the forst Monday appt and my husband had 3 appts this morning! Rob called me about setting up a demonstration for the pump, so I'll try that.    Pertinent History Pt reports she began having swelling over 50 years ago when she was pregnant. She had her first cellulitis infection at that time. Her leg swelling would come and go until the past three to four years it has not gotten any better. Pt was treated for a cellulitis infection in October of 2016. Pt reports she does not have any heart or kidney problems,  no family history of lymphedema, pt is in need of knee replacements and reports her knees buckle occasionally and that is why she uses a cane   Patient Stated Goals to get the fluid out of my legs and to strengthen my legs, be more comfortable when I walk   Currently in Pain? No/denies                         [facility] Adult PT Treatment/Exercise - 03/29/16 0001    Manual Therapy   Compression Bandaging to left leg. Biotone to whole leg,  2 pieces of thick stockinette, thick foam placed around ankle with cut out for malleoli,  x3 artiflex, 1-8, 1-10, 2-12 cm short stretch compression bandages from foot to knee with one in herringbone. Golds gym band around upper thigh over thick stockinette.                   Short Term Clinic Goals - 03/24/16 [id number]    CC Short Term Goal  #1   Title Pt will demonstrate a 2 cm reduction 10 cm proximal to lateral malleolus on LLE to decrease risk of cellulitis   Baseline ** notice measurements was taken 10 cm proximal  to lateral mallolus - 46.8 cm,  41cm 03/24/2016   Time 4   Status Achieved   CC Short Term Goal  #2   Title Pt will demonstrate a 2 cm reduction at left popliteal crease to decrease risk of cellulitis   Baseline 54 cm, 54.5 at  4/7 /2017   Status On-going   CC Short Term Goal  #3   Title Pt will demonstate a 2 cm decrease in edema 30 cm proximal to supra patella    Status On-going             Long Term Clinic Goals - 03/09/16 1730    CC Long Term Goal  #1   Title Pt will demonstrate a 4 cm decrease in circumference 10 cm proximal to left lateral malleolus to decrease risk of cellulitis   Baseline ** notice measurements taken 10 cm proximal to lateral malleolus not at floor 46.8 cm   Time 8   Period Weeks   Status New   CC Long Term Goal  #2   Title Pt will demonstrate a 3 cm decrease in edema at left popliteal crease to improve mobility   Baseline 54   Time 8   Period Weeks   Status New   CC Long Term Goal   #3   Title Pt will demonstrate a 4 cm reduction in edema 30 cm proximal to suprapatella to decrease risk of cellulitis   Baseline 77 cm   Period Weeks   Status New   CC Long Term Goal  #4   Title Pt will be fitted with an appropriate compression garment for long term management of edema   Time 8   Period Weeks   Status New   CC Long Term Goal  #5   Title Pt will demonstrate improved skin texture as noted by increased mobility of skin of left lower leg and decreased redness   Time 8   Period Weeks   Status New   CC Long Term Goal  #6   Title Pt will improve LLIS score to less than 20 percent impairment   Baseline 40   Time 8   Period Weeks   Status New   Additional Goals   Additional Goals Yes            Plan - 03/29/16 1525    Clinical Impression Statement Measured pt for Circaid thigh high garment and since she arrived late (forgot appt) was only able to rebandage today. Pt to consider wrapping bil LE's to knees and she is going to get another Golds gym wrap for her Rt thigh as well if she decides to do that. Did not have time for circumference measurements today. Pt also has been called by [employer] and they are going to set up a demonstration for pt.    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  Remeasure on midweek shorly after bandages have been removed.    Recommended Other Services Demographics sent to [facility] for Circaid garment with measurements   Consulted and Agree with  Plan of Care Patient      Patient will  benefit from skilled therapeutic intervention in order to improve the following deficits and impairments:  Decreased strength, Increased edema  Visit Diagnosis: Lymphedema, not elsewhere classified     Problem List Patient Active Problem List   Diagnosis Date Noted  . Morbid obesity ([place]) 10/08/2014  . Vasculitis ([place]) 10/08/2014  . Essential hypertension 10/08/2014  . Swelling of limb 02/18/2014  . Lymphedema 02/18/2014  . Mixed hyperlipidemia 05/14/2008  . COUGH 05/14/2008    Otelia Limes, PTA 03/29/2016, 5:18 PM  [place], [place], [address] Phone: [phone]   Fax:  [phone]  Name: CATALIA LEREW MRN: [id number] Date of Birth: 1941/01/28

## 2016-04-03 ENCOUNTER — Ambulatory Visit: Payer: Medicare Other | Admitting: Physical Therapy

## 2016-04-03 ENCOUNTER — Encounter: Payer: Self-pay | Admitting: Physical Therapy

## 2016-04-03 DIAGNOSIS — M6281 Muscle weakness (generalized): Secondary | ICD-10-CM

## 2016-04-03 DIAGNOSIS — I890 Lymphedema, not elsewhere classified: Secondary | ICD-10-CM | POA: Diagnosis not present

## 2016-04-03 NOTE — Therapy (Signed)
[place], [place], [address] Phone: [phone]   Fax:  [phone]  Physical Therapy Treatment  Patient Details  Name: Alexandra Williams MRN: [id number] Date of Birth: Nov 12, 1941 Referring Provider: [name]  Encounter Date: 04/03/2016      PT End of Session - 04/03/16 0956    Visit Number 8   Number of Visits 25   Date for PT Re-Evaluation 05/04/16   Authorization Type April cert performed   PT Start Time 0850   PT Stop Time 0930   PT Time Calculation (min) 40 min   Activity Tolerance Patient tolerated treatment well   Behavior During Therapy [facility] for tasks assessed/performed      Past Medical History  Diagnosis Date  . Abnormal Pap smear 4/82    colpo/ecc negative  . Fibrocystic breast   . Uterine fibroid   . Cervical polyp 1/99  . Postmenopausal bleeding 3/99    on HRT/endometrial biopsy-focal simple hyperplasia  . Colon polyps   . Hypertension   . Elevated lipids   . Vitamin D deficiency   . Arthritis     knee  . Hives     undetermined-on 7 antihistamines  . Hyperlipidemia   . Vasculitis ([place])     bilat legs    Past Surgical History  Procedure Laterality Date  . Tonsillectomy and adenoidectomy      as a child  . Incision and drainage      Bartholin cyst    There were no vitals filed for this visit.      Subjective Assessment - 04/03/16 0900    Subjective Pt left bandages intact from Tuesday to Thursday evening and then reports she had to remove them because her legs started itching and she was starting to get hives.    Pertinent History Pt reports she began having swelling over 50 years ago when she was pregnant. She had her first cellulitis infection at that time. Her leg swelling would come and go until the past three to four years it has not gotten any better. Pt was treated for a cellulitis infection in October of 2016. Pt reports she does not have any heart or  kidney problems, no family history of lymphedema, pt is in need of knee replacements and reports her knees buckle occasionally and that is why she uses a cane   Patient Stated Goals to get the fluid out of my legs and to strengthen my legs, be more comfortable when I walk   Currently in Pain? No/denies                         [facility] Adult PT Treatment/Exercise - 04/03/16 0001    Manual Therapy   Manual Therapy Manual Lymphatic Drainage (MLD)   Manual Lymphatic Drainage (MLD) superficial and deep abdomen, stimulation of left axillary nodes and establishment of inguino-axillary pathway, LLE working from thigh distally to foot moving fluid laterally and directing towards pathway,   Compression Bandaging to left leg. Biotone to whole leg,  2 pieces of thick stockinette, thick foam placed around ankle with cut out for malleoli,  x3 artiflex, 1-8, 1-10, 2-12 cm short stretch compression bandages from foot to knee. Golds gym band around upper thigh over thick stockinette.                   Short Term Clinic Goals - 03/24/16 0748    CC Short Term Goal  #  1   Title Pt will demonstrate a 2 cm reduction 10 cm proximal to lateral malleolus on LLE to decrease risk of cellulitis   Baseline ** notice measurements was taken 10 cm proximal to lateral mallolus - 46.8 cm,  41cm 03/24/2016   Time 4   Status Achieved   CC Short Term Goal  #2   Title Pt will demonstrate a 2 cm reduction at left popliteal crease to decrease risk of cellulitis   Baseline 54 cm, 54.5 at  4/7 /2017   Status On-going   CC Short Term Goal  #3   Title Pt will demonstate a 2 cm decrease in edema 30 cm proximal to supra patella    Status On-going             Long Term Clinic Goals - 03/09/16 1730    CC Long Term Goal  #1   Title Pt will demonstrate a 4 cm decrease in circumference 10 cm proximal to left lateral malleolus to decrease risk of cellulitis   Baseline ** notice measurements taken 10 cm  proximal to lateral malleolus not at floor 46.8 cm   Time 8   Period Weeks   Status New   CC Long Term Goal  #2   Title Pt will demonstrate a 3 cm decrease in edema at left popliteal crease to improve mobility   Baseline 54   Time 8   Period Weeks   Status New   CC Long Term Goal  #3   Title Pt will demonstrate a 4 cm reduction in edema 30 cm proximal to suprapatella to decrease risk of cellulitis   Baseline 77 cm   Period Weeks   Status New   CC Long Term Goal  #4   Title Pt will be fitted with an appropriate compression garment for long term management of edema   Time 8   Period Weeks   Status New   CC Long Term Goal  #5   Title Pt will demonstrate improved skin texture as noted by increased mobility of skin of left lower leg and decreased redness   Time 8   Period Weeks   Status New   CC Long Term Goal  #6   Title Pt will improve LLIS score to less than 20 percent impairment   Baseline 40   Time 8   Period Weeks   Status New   Additional Goals   Additional Goals Yes            Plan - 04/03/16 0957    Clinical Impression Statement Pt was educated about different types of compression garments. She is leaning towards a CircAid and she may need to be remeasured if she continues to reduce. She is still having difficulty with the bandages causing hives but the Golds gym band on her thigh is not causing her problems. Did not have time today for MLD on RLE today or to take measurements.    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  Remeasure on midweek shorly after  bandages have been removed.    Consulted and Agree with Plan of Care Patient      Patient will benefit from skilled therapeutic intervention in order to improve the following deficits and impairments:  Decreased strength, Increased edema  Visit Diagnosis: Lymphedema, not elsewhere classified  Muscle weakness (generalized)     Problem List Patient Active Problem List   Diagnosis Date Noted  . Morbid obesity ([place]) 10/08/2014  . Vasculitis ([place]) 10/08/2014  . Essential hypertension 10/08/2014  . Swelling of limb 02/18/2014  . Lymphedema 02/18/2014  . Mixed hyperlipidemia 05/14/2008  . COUGH 05/14/2008    Alexandra Williams 04/03/2016, 10:16 AM  [place], [place], [address] Phone: [phone]   Fax:  [phone]  Name: Alexandra Williams MRN: [id number] Date of Birth: 04-Sep-1941    Allyson Sabal, PT 04/03/2016 10:16 AM

## 2016-04-05 ENCOUNTER — Ambulatory Visit: Payer: Medicare Other

## 2016-04-05 DIAGNOSIS — M6281 Muscle weakness (generalized): Secondary | ICD-10-CM | POA: Diagnosis not present

## 2016-04-05 DIAGNOSIS — I890 Lymphedema, not elsewhere classified: Secondary | ICD-10-CM

## 2016-04-05 NOTE — Therapy (Signed)
[place], [place], [address] Phone: [phone]   Fax:  [phone]  Physical Therapy Treatment  Patient Details  Name: Alexandra Williams MRN: [id number] Date of Birth: 1941/06/08 Referring Provider: [name]  Encounter Date: 04/05/2016      PT End of Session - 04/05/16 1525    Visit Number 9   Number of Visits 25   Date for PT Re-Evaluation 05/04/16   PT Start Time [id number]   PT Stop Time 1520   PT Time Calculation (min) 87 min   Activity Tolerance Patient tolerated treatment well   Behavior During Therapy [facility] for tasks assessed/performed      Past Medical History  Diagnosis Date  . Abnormal Pap smear 4/82    colpo/ecc negative  . Fibrocystic breast   . Uterine fibroid   . Cervical polyp 1/99  . Postmenopausal bleeding 3/99    on HRT/endometrial biopsy-focal simple hyperplasia  . Colon polyps   . Hypertension   . Elevated lipids   . Vitamin D deficiency   . Arthritis     knee  . Hives     undetermined-on 7 antihistamines  . Hyperlipidemia   . Vasculitis ([place])     bilat legs    Past Surgical History  Procedure Laterality Date  . Tonsillectomy and adenoidectomy      as a child  . Incision and drainage      Bartholin cyst    There were no vitals filed for this visit.      Subjective Assessment - 04/05/16 1358    Subjective My bandages stayed on for about 34 hours before I took them off due to itching, but they were also starting to get loose, especially at the foot/around the ankle. I'm not sure the bandage was tight enough last time.  Feel okay today, my knees are just achy from the weather (rainy/cold).   Pertinent History Pt reports she began having swelling over 50 years ago when she was pregnant. She had her first cellulitis infection at that time. Her leg swelling would come and go until the past three to four years it has not gotten any better. Pt was treated for a  cellulitis infection in October of 2016. Pt reports she does not have any heart or kidney problems, no family history of lymphedema, pt is in need of knee replacements and reports her knees buckle occasionally and that is why she uses a cane   Patient Stated Goals to get the fluid out of my legs and to strengthen my legs, be more comfortable when I walk   Currently in Pain? No/denies               LYMPHEDEMA/ONCOLOGY QUESTIONNAIRE - 04/05/16 1400    Left Lower Extremity Lymphedema   30 cm Proximal to Suprapatella 76.4 cm   20 cm Proximal to Suprapatella 69.4 cm   10 cm Proximal to Suprapatella 66.1 cm   At Midpatella/Popliteal Crease 58.6 cm  popliteal crease   30 cm Proximal to Floor at Lateral Plantar Foot 53 cm  Fom prox lat malleolus   20 cm Proximal to Floor at Lateral Plantar Foot 50.4 cm  From prox lat malleolus   10 cm Proximal to Floor at Lateral Malleoli 45.2 cm  From prox lat malleolus   5 cm Proximal to 1st MTP Joint 23.7 cm   Around Proximal Great Toe 8 cm   Other Bandages have been off for 27 hours  [facility] Adult PT Treatment/Exercise - 04/05/16 0001    Manual Therapy   Manual Therapy Manual Lymphatic Drainage (MLD);Compression Bandaging   Manual therapy comments Circumferential mesurements taken on Lt LE   Manual Lymphatic Drainage (MLD) Short neck, superficial and deep abdomen, stimulation of left axillary nodes and establishment of inguino-axillary pathway, Lt LE working from thigh distally to foot moving fluid laterally and directing towards pathway,, then same for Rt LE   Compression Bandaging to left leg. Biotone to whole leg, thick stockinetteto whole leg (1 piece), 1/2" gray foam around ankle with cut out for malleoli, Artiflex x3, 1-8, 1-10, 2-12 cm short stretch compression bandages from foot to knee (first 12 cm in herring bone fashion). Golds gym band around upper thigh over thick stockinette.                   Short  Term Clinic Goals - 04/05/16 1536    CC Short Term Goal  #1   Title Pt will demonstrate a 2 cm reduction 10 cm proximal to lateral malleolus on LLE to decrease risk of cellulitis   Baseline ** notice measurements was taken 10 cm proximal to lateral mallolus - 46.8 cm,  41cm 03/24/2016, 45.2 cm 04/05/16 (out of bandages 27 hours)   Status On-going   CC Short Term Goal  #2   Title Pt will demonstrate a 2 cm reduction at left popliteal crease to decrease risk of cellulitis   Baseline 54 cm, 54.5 at  4/7 /2017, 58.6 cm 04/05/16   Status On-going   CC Short Term Goal  #3   Title Pt will demonstate a 2 cm decrease in edema 30 cm proximal to supra patella    Baseline 77 cm, 76.4 cm 04/05/16   Status On-going             Long Term Clinic Goals - 04/05/16 1538    CC Long Term Goal  #1   Title Pt will demonstrate a 4 cm decrease in circumference 10 cm proximal to left lateral malleolus to decrease risk of cellulitis   Baseline ** notice measurements taken 10 cm proximal to lateral malleolus not at floor 46.8 cm, 45.2 04/05/16    Status On-going   CC Long Term Goal  #2   Title Pt will demonstrate a 3 cm decrease in edema at left popliteal crease to improve mobility   Baseline 54, 58.6 cm 04/05/16   Status On-going   CC Long Term Goal  #3   Title Pt will demonstrate a 4 cm reduction in edema 30 cm proximal to suprapatella to decrease risk of cellulitis   Baseline 77 cm, 76.4 cm 04/05/16   Status On-going   CC Long Term Goal  #4   Title Pt will be fitted with an appropriate compression garment for long term management of edema   Status On-going   CC Long Term Goal  #5   Title Pt will demonstrate improved skin texture as noted by increased mobility of skin of left lower leg and decreased redness   Status On-going   CC Long Term Goal  #6   Title Pt will improve LLIS score to less than 20 percent impairment   Status On-going            Plan - 04/05/16 1526    Clinical Impression Statement  Circumference was increased at Mrs. Cena Benton ankle/lower leg but her calf and up were all reduced except knee was also increased, but this is an area  that is difficult to keep in compression due to pts hives, the bandages cause itching so using the band at her upper thigh which is reducing well.  Pt had to remove bandages due to itching/hives at 1100 yesterday (off for 27 hours) and she reports noticing good reduction when bandages are initially removed but then swelling begins to increase, especially at her ankle, within a few hours of having no compression. Pt will benfit from continued compression bandaging here while helping to transition her into a more permanent bandaging alternative.    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    Consulted and Agree with Plan of Care Patient      Patient will benefit from skilled therapeutic intervention in order to improve the following deficits and impairments:  Decreased strength, Increased edema  Visit Diagnosis: Lymphedema, not elsewhere classified     Problem List Patient Active Problem List   Diagnosis Date Noted  . Morbid obesity ([place]) 10/08/2014  . Vasculitis ([place]) 10/08/2014  . Essential hypertension 10/08/2014  . Swelling of limb 02/18/2014  . Lymphedema 02/18/2014  . Mixed hyperlipidemia 05/14/2008  . COUGH 05/14/2008    Otelia Limes, PTA 04/05/2016, 3:40 PM  [place], [place], [address] Phone:  [phone]   Fax:  [phone]  Name: TAKIRA DANEHY MRN: [id number] Date of Birth: 10-Jun-1941

## 2016-04-06 DIAGNOSIS — L501 Idiopathic urticaria: Secondary | ICD-10-CM | POA: Diagnosis not present

## 2016-04-07 ENCOUNTER — Encounter: Payer: Medicare Other | Admitting: Physical Therapy

## 2016-04-10 ENCOUNTER — Ambulatory Visit: Payer: Medicare Other | Admitting: Physical Therapy

## 2016-04-10 ENCOUNTER — Encounter: Payer: Self-pay | Admitting: Physical Therapy

## 2016-04-10 DIAGNOSIS — M6281 Muscle weakness (generalized): Secondary | ICD-10-CM | POA: Diagnosis not present

## 2016-04-10 DIAGNOSIS — I890 Lymphedema, not elsewhere classified: Secondary | ICD-10-CM

## 2016-04-10 NOTE — Therapy (Signed)
[place], [place], [address] Phone: [phone]   Fax:  [phone]  Physical Therapy Treatment  Patient Details  Name: Alexandra Williams MRN: [id number] Date of Birth: 1941/11/17 Referring Provider: [name]  Encounter Date: 04/10/2016      PT End of Session - 04/10/16 1514    Visit Number 10   Number of Visits 25   Date for PT Re-Evaluation 05/04/16   Authorization Type April cert performed   PT Start Time 1345   PT Stop Time 1513   PT Time Calculation (min) 88 min   Activity Tolerance Patient tolerated treatment well   Behavior During Therapy [facility] for tasks assessed/performed      Past Medical History  Diagnosis Date  . Abnormal Pap smear 4/82    colpo/ecc negative  . Fibrocystic breast   . Uterine fibroid   . Cervical polyp 1/99  . Postmenopausal bleeding 3/99    on HRT/endometrial biopsy-focal simple hyperplasia  . Colon polyps   . Hypertension   . Elevated lipids   . Vitamin D deficiency   . Arthritis     knee  . Hives     undetermined-on 7 antihistamines  . Hyperlipidemia   . Vasculitis ([place])     bilat legs    Past Surgical History  Procedure Laterality Date  . Tonsillectomy and adenoidectomy      as a child  . Incision and drainage      Bartholin cyst    There were no vitals filed for this visit.      Subjective Assessment - 04/10/16 1348    Subjective Pt states she did well and was able to keep bandages on for almost two days - had to take them off on Friday around mid afternoon.    Pertinent History Pt reports she began having swelling over 50 years ago when she was pregnant. She had her first cellulitis infection at that time. Her leg swelling would come and go until the past three to four years it has not gotten any better. Pt was treated for a cellulitis infection in October of 2016. Pt reports she does not have any heart or kidney problems, no family history  of lymphedema, pt is in need of knee replacements and reports her knees buckle occasionally and that is why she uses a cane   Patient Stated Goals to get the fluid out of my legs and to strengthen my legs, be more comfortable when I walk   Currently in Pain? No/denies                         [facility] Adult PT Treatment/Exercise - 04/10/16 0001    Manual Therapy   Manual Therapy Manual Lymphatic Drainage (MLD);Compression Bandaging   Manual Lymphatic Drainage (MLD) Short neck, superficial and deep abdomen, stimulation of left axillary nodes and establishment of inguino-axillary pathway, Lt LE working from thigh distally to foot moving fluid laterally and directing towards pathway,, then same for Rt LE   Compression Bandaging to left leg. Biotone to whole leg, thick stockinetteto whole leg (1 piece), 1/2" gray foam around ankle with cut out for malleoli, Artiflex x3, 1-8, 1-10, 2-12 cm short stretch compression bandages from foot to knee (first 12 cm in herring bone fashion). Golds gym band around upper thigh over thick stockinette.                PT Education - 04/10/16  70    Education provided Yes   Education Details different types of compression garments and benefits    Person(s) Educated Patient   Methods Explanation   Comprehension Verbalized understanding           Short Term Clinic Goals - 04/05/16 1536    CC Short Term Goal  #1   Title Pt will demonstrate a 2 cm reduction 10 cm proximal to lateral malleolus on LLE to decrease risk of cellulitis   Baseline ** notice measurements was taken 10 cm proximal to lateral mallolus - 46.8 cm,  41cm 03/24/2016, 45.2 cm 04/05/16 (out of bandages 27 hours)   Status On-going   CC Short Term Goal  #2   Title Pt will demonstrate a 2 cm reduction at left popliteal crease to decrease risk of cellulitis   Baseline 54 cm, 54.5 at  4/7 /2017, 58.6 cm 04/05/16   Status On-going   CC Short Term Goal  #3   Title Pt will  demonstate a 2 cm decrease in edema 30 cm proximal to supra patella    Baseline 77 cm, 76.4 cm 04/05/16   Status On-going             Long Term Clinic Goals - 04/05/16 1538    CC Long Term Goal  #1   Title Pt will demonstrate a 4 cm decrease in circumference 10 cm proximal to left lateral malleolus to decrease risk of cellulitis   Baseline ** notice measurements taken 10 cm proximal to lateral malleolus not at floor 46.8 cm, 45.2 04/05/16    Status On-going   CC Long Term Goal  #2   Title Pt will demonstrate a 3 cm decrease in edema at left popliteal crease to improve mobility   Baseline 54, 58.6 cm 04/05/16   Status On-going   CC Long Term Goal  #3   Title Pt will demonstrate a 4 cm reduction in edema 30 cm proximal to suprapatella to decrease risk of cellulitis   Baseline 77 cm, 76.4 cm 04/05/16   Status On-going   CC Long Term Goal  #4   Title Pt will be fitted with an appropriate compression garment for long term management of edema   Status On-going   CC Long Term Goal  #5   Title Pt will demonstrate improved skin texture as noted by increased mobility of skin of left lower leg and decreased redness   Status On-going   CC Long Term Goal  #6   Title Pt will improve LLIS score to less than 20 percent impairment   Status On-going            Plan - 04/10/16 1514    Clinical Impression Statement Pt is tolerating bandages well and is able to keep them in place for longer periods of time. She states the hives have improved. Educated pt about different garments once she demonstrates maximal reduction of edema. Velcro compression garments would be the best option for the patient due to shape of legs and ease of donning doffing.    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    PT Frequency 3x / week   PT Duration 8 weeks   PT Treatment/Interventions Compression  bandaging;Manual techniques;Manual lymph drainage;Therapeutic exercise;Therapeutic activities   PT Next Visit Plan Cont MLD to bilateral LEs as time allows;  bandaging to Lt LE from foot  to knee and Golds gym band with TG soft to upper thigh, measure for CircAid juxta fit essentials once pt reaches maximal reduction of edema   Consulted and Agree with Plan of Care Patient      Patient will benefit from skilled therapeutic intervention in order to improve the following deficits and impairments:  Decreased strength, Increased edema  Visit Diagnosis: Lymphedema, not elsewhere classified       G-Codes - May 10, 2016 1517    Functional Assessment Tool Used per clinical judgement   Functional Limitation Mobility: Walking and moving around   Mobility: Walking and Moving Around Current Status [id number]) At least 40 percent but less than 60 percent impaired, limited or restricted   Mobility: Walking and Moving Around Goal Status [phone]) At least 20 percent but less than 40 percent impaired, limited or restricted      Problem List Patient Active Problem List   Diagnosis Date Noted  . Morbid obesity ([place]) 10/08/2014  . Vasculitis ([place]) 10/08/2014  . Essential hypertension 10/08/2014  . Swelling of limb 02/18/2014  . Lymphedema 02/18/2014  . Mixed hyperlipidemia 05/14/2008  . COUGH 05/14/2008    Alexia Freestone May 10, 2016, 3:18 PM  [place], [place], [address] Phone: [phone]   Fax:  [phone]  Name: Alexandra Williams MRN: [id number] Date of Birth: [birth year]    Allyson Sabal, PT 05/10/16 3:18 PM

## 2016-04-12 ENCOUNTER — Ambulatory Visit: Payer: Medicare Other

## 2016-04-12 DIAGNOSIS — I890 Lymphedema, not elsewhere classified: Secondary | ICD-10-CM | POA: Diagnosis not present

## 2016-04-12 DIAGNOSIS — M6281 Muscle weakness (generalized): Secondary | ICD-10-CM | POA: Diagnosis not present

## 2016-04-12 NOTE — Therapy (Signed)
[place], [place], [address] Phone: [phone]   Fax:  [phone]  Physical Therapy Treatment  Patient Details  Name: Alexandra Williams MRN: [id number] Date of Birth: 1941-12-04 Referring Provider: [name]  Encounter Date: 04/12/2016      PT End of Session - 04/12/16 1511    Visit Number 11   Number of Visits 25   Date for PT Re-Evaluation 05/04/16   PT Start Time 1351   PT Stop Time 1507   PT Time Calculation (min) 76 min   Activity Tolerance Patient tolerated treatment well   Behavior During Therapy [facility] for tasks assessed/performed      Past Medical History  Diagnosis Date  . Abnormal Pap smear 4/82    colpo/ecc negative  . Fibrocystic breast   . Uterine fibroid   . Cervical polyp 1/99  . Postmenopausal bleeding 3/99    on HRT/endometrial biopsy-focal simple hyperplasia  . Colon polyps   . Hypertension   . Elevated lipids   . Vitamin D deficiency   . Arthritis     knee  . Hives     undetermined-on 7 antihistamines  . Hyperlipidemia   . Vasculitis ([place])     bilat legs    Past Surgical History  Procedure Laterality Date  . Tonsillectomy and adenoidectomy      as a child  . Incision and drainage      Bartholin cyst    There were no vitals filed for this visit.      Subjective Assessment - 04/12/16 1402    Subjective Kept the bandages on until last night but I forgot to bring them back today!!! Had appts with my husband and was just busy this morning and forgot. Maybe we can just wrap my ankle and I'll do the rest when I get home.    Pertinent History Pt reports she began having swelling over 50 years ago when she was pregnant. She had her first cellulitis infection at that time. Her leg swelling would come and go until the past three to four years it has not gotten any better. Pt was treated for a cellulitis infection in October of 2016. Pt reports she does not have any  heart or kidney problems, no family history of lymphedema, pt is in need of knee replacements and reports her knees buckle occasionally and that is why she uses a cane   Patient Stated Goals to get the fluid out of my legs and to strengthen my legs, be more comfortable when I walk   Currently in Pain? No/denies               LYMPHEDEMA/ONCOLOGY QUESTIONNAIRE - 04/12/16 1448    Left Lower Extremity Lymphedema   30 cm Proximal to Suprapatella 75.4 cm   20 cm Proximal to Suprapatella 69.5 cm   10 cm Proximal to Suprapatella 65.4 cm   At Midpatella/Popliteal Crease 58.9 cm   30 cm Proximal to Floor at Lateral Plantar Foot 53.6 cm   20 cm Proximal to Floor at Lateral Plantar Foot 49.2 cm   10 cm Proximal to Floor at Lateral Malleoli 42 cm   5 cm Proximal to 1st MTP Joint 23.1 cm   Around Proximal Great Toe 7.4 cm   Other Bandages off about 14 hours                  [facility] Adult PT Treatment/Exercise - 04/12/16 0001    Manual  Therapy   Manual Lymphatic Drainage (MLD) Short neck, superficial and deep abdomen, stimulation of left axillary nodes and establishment of inguino-axillary pathway, Lt LE working from thigh distally to foot moving fluid laterally and directing towards pathway,, then same for Rt LE   Compression Bandaging To Lt LE: Biotone lotion applied, thick stockinette, Artiflex to knee with 1/4" gray foam at posterior ankle, 1-8 cm and 1-10 cm short stretch compression bandages from foot to knee. Pt will add her 2-12 cm when she gets home.                    Short Term Clinic Goals - 04/12/16 1524    CC Short Term Goal  #1   Title Pt will demonstrate a 2 cm reduction 10 cm proximal to lateral malleolus on LLE to decrease risk of cellulitis   Baseline ** notice measurements was taken 10 cm proximal to lateral mallolus - 46.8 cm,  41cm 03/24/2016, 45.2 cm 04/05/16 (out of bandages 27 hours), 42 cm 04/12/16 (out of bandages 14 hours)   Status Achieved   CC  Short Term Goal  #2   Title Pt will demonstrate a 2 cm reduction at left popliteal crease to decrease risk of cellulitis   Baseline 54 cm, 54.5 at  4/7 /2017, 58.6 cm 04/05/16, 58.9 cm 04/12/16   Status On-going   CC Short Term Goal  #3   Title Pt will demonstate a 2 cm decrease in edema 30 cm proximal to supra patella    Baseline 77 cm, 76.4 cm 04/05/16, 75.4 cm 04/12/16   Status Partially Met             Long Term Clinic Goals - 04/12/16 1526    CC Long Term Goal  #1   Title Pt will demonstrate a 4 cm decrease in circumference 10 cm proximal to left lateral malleolus to decrease risk of cellulitis   Baseline ** notice measurements taken 10 cm proximal to lateral malleolus not at floor 46.8 cm, 45.2 04/05/16, 42 cm 04/12/16   Status Achieved   CC Long Term Goal  #2   Title Pt will demonstrate a 3 cm decrease in edema at left popliteal crease to improve mobility   Baseline 54, 58.6 cm 04/05/16, 58.9 04/12/16   Status On-going   CC Long Term Goal  #3   Title Pt will demonstrate a 4 cm reduction in edema 30 cm proximal to suprapatella to decrease risk of cellulitis   Baseline 77 cm, 76.4 cm 04/05/16, 75.4 cm 04/12/16   Status On-going   CC Long Term Goal  #4   Title Pt will be fitted with an appropriate compression garment for long term management of edema   Status On-going   CC Long Term Goal  #5   Title Pt will demonstrate improved skin texture as noted by increased mobility of skin of left lower leg and decreased redness   Status On-going   CC Long Term Goal  #6   Title Pt will improve LLIS score to less than 20 percent impairment   Status On-going            Plan - 04/12/16 1512    Clinical Impression Statement Pt had to remove bandages last night due to increased itching. She was happy to have kept them on for that long. She forgot her bandages today but requested therpiast just wrap her ankle so she could add her 2-12cm short stretch compression bandages when she got  home.  Therapist decided to add 1-10 cm bandage as well to lower leg in herring bone fashion to keep Artiflex on and give pt some support to her lymphedema until she got home.  Advised pt to look at website lymphedema products for pricing. She would like to wrap about 2 more session and then measure her for garments. Pt demonstrated good reduction of fluid since last time measured. Bandges were off less time (14 hours) than time before (27 hours). She reduces well with compression.    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    PT Frequency 3x / week   PT Duration 8 weeks   PT Treatment/Interventions Compression bandaging;Manual techniques;Manual lymph drainage;Therapeutic exercise;Therapeutic activities   PT Next Visit Plan Cont MLD to bilateral LEs as time allows;  bandaging to Lt LE from foot to knee and Golds gym band with TG soft to upper thigh, measure for CircAid juxta fit essentials once pt reaches maximal reduction of edema   Consulted and Agree with Plan of Care Patient      Patient will benefit from skilled therapeutic intervention in order to improve the following deficits and impairments:  Decreased strength, Increased edema  Visit Diagnosis: Lymphedema, not elsewhere classified     Problem List Patient Active Problem List   Diagnosis Date Noted  . Morbid obesity ([place]) 10/08/2014  . Vasculitis ([place]) 10/08/2014  . Essential hypertension 10/08/2014  . Swelling of limb 02/18/2014  . Lymphedema 02/18/2014  . Mixed hyperlipidemia 05/14/2008  . COUGH 05/14/2008    Otelia Limes, PTA 04/12/2016, 3:28 PM  [place], [place], [address] Phone: [phone]   Fax:  [phone]  Name: JANI MORONTA MRN: [id number] Date of Birth: 13-Jan-1941

## 2016-04-14 ENCOUNTER — Encounter: Payer: Medicare Other | Admitting: Physical Therapy

## 2016-04-17 ENCOUNTER — Ambulatory Visit: Payer: Medicare Other | Admitting: Physical Therapy

## 2016-04-18 ENCOUNTER — Encounter: Payer: Medicare Other | Admitting: Physical Therapy

## 2016-04-19 ENCOUNTER — Ambulatory Visit: Payer: Medicare Other | Attending: Obstetrics & Gynecology

## 2016-04-19 DIAGNOSIS — I890 Lymphedema, not elsewhere classified: Secondary | ICD-10-CM | POA: Diagnosis not present

## 2016-04-19 DIAGNOSIS — M6281 Muscle weakness (generalized): Secondary | ICD-10-CM | POA: Diagnosis not present

## 2016-04-19 NOTE — Therapy (Signed)
[place], [place], [address] Phone: [phone]   Fax:  [phone]  Physical Therapy Treatment  Patient Details  Name: Alexandra Williams MRN: [id number] Date of Birth: April 08, 1941 Referring Provider: [name]  Encounter Date: 04/19/2016      PT End of Session - 04/19/16 1434    Visit Number 12   Number of Visits 25   Date for PT Re-Evaluation 05/04/16   PT Start Time 1351   PT Stop Time 1520   PT Time Calculation (min) 89 min   Activity Tolerance Patient tolerated treatment well   Behavior During Therapy [facility] for tasks assessed/performed      Past Medical History  Diagnosis Date  . Abnormal Pap smear 4/82    colpo/ecc negative  . Fibrocystic breast   . Uterine fibroid   . Cervical polyp 1/99  . Postmenopausal bleeding 3/99    on HRT/endometrial biopsy-focal simple hyperplasia  . Colon polyps   . Hypertension   . Elevated lipids   . Vitamin D deficiency   . Arthritis     knee  . Hives     undetermined-on 7 antihistamines  . Hyperlipidemia   . Vasculitis ([place])     bilat legs    Past Surgical History  Procedure Laterality Date  . Tonsillectomy and adenoidectomy      as a child  . Incision and drainage      Bartholin cyst    There were no vitals filed for this visit.      Subjective Assessment - 04/19/16 1431    Subjective I've been looking into the compression bandages. The bandages lasted for 2 days last time. And then I rewrapped for another 24 hours.    Pertinent History Pt reports she began having swelling over 50 years ago when she was pregnant. She had her first cellulitis infection at that time. Her leg swelling would come and go until the past three to four years it has not gotten any better. Pt was treated for a cellulitis infection in October of 2016. Pt reports she does not have any heart or kidney problems, no family history of lymphedema, pt is in need of knee  replacements and reports her knees buckle occasionally and that is why she uses a cane   Patient Stated Goals to get the fluid out of my legs and to strengthen my legs, be more comfortable when I walk   Currently in Pain? No/denies                         [facility] Adult PT Treatment/Exercise - 04/19/16 0001    Self-Care   Other Self-Care Comments  Discussed with pt today and measured her for JuxtaFit Essentials compression garments. Looked at websites and issued her information on how to order them and pt felt good about this after our discussion.    Manual Therapy   Manual Lymphatic Drainage (MLD) Short neck, superficial and deep abdomen, stimulation of left axillary nodes and establishment of inguino-axillary pathway, Lt LE working from thigh distally to foot moving fluid laterally and directing towards pathway,, then same for Rt LE   Compression Bandaging To Lt LE: Biotone lotion applied, thick stockinette, Artiflex to knee with 1/4" gray foam at posterior ankle, 1-8 cm and 1-10 cm short stretch compression bandages from foot to knee. Pt will add her 2-12 cm when she gets home.  PT Education - 04/19/16 1658    Education provided Yes   Education Details Issued handouts of website links to order compression garments and to look into compression pump (pt likes Flexitouch) and copy of measurements taken today so pt knows what soze to order.    Person(s) Educated Patient   Methods Explanation;Handout   Comprehension Verbalized understanding           Short Term Clinic Goals - 04/19/16 1703    CC Short Term Goal  #1   Title Pt will demonstrate a 2 cm reduction 10 cm proximal to lateral malleolus on LLE to decrease risk of cellulitis   Status Achieved   CC Short Term Goal  #2   Title Pt will demonstrate a 2 cm reduction at left popliteal crease to decrease risk of cellulitis   Status On-going   CC Short Term Goal  #3   Title Pt will demonstate a 2 cm  decrease in edema 30 cm proximal to supra patella    Status On-going             Long Term Clinic Goals - 04/19/16 1703    CC Long Term Goal  #1   Title Pt will demonstrate a 4 cm decrease in circumference 10 cm proximal to left lateral malleolus to decrease risk of cellulitis   Status Achieved   CC Long Term Goal  #2   Title Pt will demonstrate a 3 cm decrease in edema at left popliteal crease to improve mobility   Status On-going   CC Long Term Goal  #3   Title Pt will demonstrate a 4 cm reduction in edema 30 cm proximal to suprapatella to decrease risk of cellulitis   Status On-going   CC Long Term Goal  #4   Title Pt will be fitted with an appropriate compression garment for long term management of edema  Meaured pt for these today and she plans to order them soon.   Status Partially Met   CC Long Term Goal  #5   Title Pt will demonstrate improved skin texture as noted by increased mobility of skin of left lower leg and decreased redness   Status Partially Met   CC Long Term Goal  #6   Title Pt will improve LLIS score to less than 20 percent impairment   Status On-going            Plan - 04/19/16 1659    Clinical Impression Statement Pt did well with bandages from last session leaving them on for 2 days, and then she rewrapped herself and felt like she did okay with that. Discussed with pt possibility of her daughter (who lives with them) learning to bandage and pt liked that idea and thought her daughter might like that if it doesn't overwhelm her (due to being autistic). Pt is going to order her compression garments, per her report, sometime this week and plans to continue therapy until they arrive. Pt is going to order JuxtaFit Essentials upper and lower leg pieces, short size XL full calf lower, and short L for upper.    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 Assess if pt ordered her compression garments. Instruct daughter to bandage if she comes.    Consulted and Agree with Plan of Care Patient      Patient will benefit from skilled therapeutic intervention in order to improve the following deficits and impairments:  Decreased strength, Increased edema  Visit Diagnosis: Lymphedema, not elsewhere classified     Problem List Patient Active Problem List   Diagnosis Date Noted  . Morbid obesity ([place]) 10/08/2014  . Vasculitis ([place]) 10/08/2014  . Essential hypertension 10/08/2014  . Swelling of limb 02/18/2014  . Lymphedema 02/18/2014  . Mixed hyperlipidemia 05/14/2008  . COUGH 05/14/2008    Otelia Limes, PTA 04/19/2016, 5:05 PM  [place], [place], [address] Phone: [phone]   Fax:  [phone]  Name: Alexandra Williams MRN: [id number] Date of Birth: Dec 17, 1941

## 2016-04-23 ENCOUNTER — Other Ambulatory Visit: Payer: Self-pay | Admitting: Interventional Cardiology

## 2016-04-24 ENCOUNTER — Ambulatory Visit: Payer: Medicare Other | Admitting: Physical Therapy

## 2016-04-24 DIAGNOSIS — M6281 Muscle weakness (generalized): Secondary | ICD-10-CM

## 2016-04-24 DIAGNOSIS — I890 Lymphedema, not elsewhere classified: Secondary | ICD-10-CM | POA: Diagnosis not present

## 2016-04-24 NOTE — Therapy (Signed)
[place], [place], [address] Phone: [phone]   Fax:  [phone]  Physical Therapy Treatment  Patient Details  Name: Alexandra Williams MRN: [id number] Date of Birth: 10-29-1941 Referring Provider: [name]  Encounter Date: 04/24/2016      PT End of Session - 04/24/16 1443    Visit Number 14   Number of Visits 25   Date for PT Re-Evaluation 05/04/16   PT Start Time 1312   PT Stop Time 1435   PT Time Calculation (min) 83 min   Activity Tolerance Patient tolerated treatment well   Behavior During Therapy [facility] for tasks assessed/performed      Past Medical History  Diagnosis Date  . Abnormal Pap smear 4/82    colpo/ecc negative  . Fibrocystic breast   . Uterine fibroid   . Cervical polyp 1/99  . Postmenopausal bleeding 3/99    on HRT/endometrial biopsy-focal simple hyperplasia  . Colon polyps   . Hypertension   . Elevated lipids   . Vitamin D deficiency   . Arthritis     knee  . Hives     undetermined-on 7 antihistamines  . Hyperlipidemia   . Vasculitis ([place])     bilat legs    Past Surgical History  Procedure Laterality Date  . Tonsillectomy and adenoidectomy      as a child  . Incision and drainage      Bartholin cyst    There were no vitals filed for this visit.      Subjective Assessment - 04/24/16 1439    Subjective "my leg is down"  She has been able to do some wrapping on her own.  She will not be getting the Flexitouch as it it too  expensive. She is continuuing to look at other options. She plans to order circaids oday    Pertinent History Pt reports she began having swelling over 50 years ago when she was pregnant. She had her first cellulitis infection at that time. Her leg swelling would come and go until the past three to four years it has not gotten any better. Pt was treated for a cellulitis infection in October of 2016. Pt reports she does not have any heart  or kidney problems, no family history of lymphedema, pt is in need of knee replacements and reports her knees buckle occasionally and that is why she uses a cane   Currently in Pain? No/denies                         [facility] Adult PT Treatment/Exercise - 04/24/16 0001    Knee/Hip Exercises: Supine   Bridges Both;5 reps   Straight Leg Raises AROM;Both;5 reps   Other Supine Knee/Hip Exercises knee to chest with each leg    Knee/Hip Exercises: Sidelying   Hip ABduction AROM;Left;2 sets;5 reps   Manual Therapy   Manual Lymphatic Drainage (MLD) Short neck, superficial and deep abdomen, stimulation of left axillary nodes and establishment of inguino-axillary pathway, Lt LE working from thigh distally to foot moving fluid laterally and directing towards pathway,,then to right sidelying for posterior left leg, hip and back    Compression Bandaging To Lt LE: Biotone lotion applied, thick stockinette in 2 pieces to left thigh and lower leg. , Artiflex to knee with 1/4" gray foam at posterior ankle, 1-8 cm and 1-10 cm and 2 12 cm short stretch compression bandages from foot to knee.  thigh  piece of tg soft brought down to cover knee and golds gen noprene band placed on thigh to support medial knee thigh lobule                    Short Term Clinic Goals - 04/19/16 1703    CC Short Term Goal  #1   Title Pt will demonstrate a 2 cm reduction 10 cm proximal to lateral malleolus on LLE to decrease risk of cellulitis   Status Achieved   CC Short Term Goal  #2   Title Pt will demonstrate a 2 cm reduction at left popliteal crease to decrease risk of cellulitis   Status On-going   CC Short Term Goal  #3   Title Pt will demonstate a 2 cm decrease in edema 30 cm proximal to supra patella    Status On-going             Long Term Clinic Goals - 04/19/16 1703    CC Long Term Goal  #1   Title Pt will demonstrate a 4 cm decrease in circumference 10 cm proximal to left lateral  malleolus to decrease risk of cellulitis   Status Achieved   CC Long Term Goal  #2   Title Pt will demonstrate a 3 cm decrease in edema at left popliteal crease to improve mobility   Status On-going   CC Long Term Goal  #3   Title Pt will demonstrate a 4 cm reduction in edema 30 cm proximal to suprapatella to decrease risk of cellulitis   Status On-going   CC Long Term Goal  #4   Title Pt will be fitted with an appropriate compression garment for long term management of edema  Meaured pt for these today and she plans to order them soon.   Status Partially Met   CC Long Term Goal  #5   Title Pt will demonstrate improved skin texture as noted by increased mobility of skin of left lower leg and decreased redness   Status Partially Met   CC Long Term Goal  #6   Title Pt will improve LLIS score to less than 20 percent impairment   Status On-going            Plan - 04/24/16 1447    Clinical Impression Statement Pt feels that she is gettting reduction in both legs and that she is going to order her circaid today .  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 Cont MLD to bilateral LEs as time allows;  bandaging to Lt LE from foot to knee and Golds gym band with TG soft to upper thigh. Assess if pt ordered her compression garments. Instruct daughter to bandage if she comes.       Patient will benefit from skilled therapeutic intervention in order to improve the following deficits and impairments:  Decreased strength, Increased edema  Visit Diagnosis: Lymphedema, not elsewhere classified  Muscle weakness (generalized)     Problem List Patient Active Problem List   Diagnosis Date Noted  . Morbid obesity ([place]) 10/08/2014  . Vasculitis ([place]) 10/08/2014   . Essential hypertension 10/08/2014  . Swelling of limb 02/18/2014  . Lymphedema 02/18/2014  . Mixed hyperlipidemia 05/14/2008  . COUGH 05/14/2008  Donato Heinz. Owens Shark, PT 04/24/2016, 2:54 PM  [place], [place], [address] Phone: [phone]   Fax:  [phone]  Name: Alexandra Williams MRN: [id number] Date of Birth: June 15, 1941

## 2016-04-26 ENCOUNTER — Ambulatory Visit: Payer: Medicare Other

## 2016-04-26 DIAGNOSIS — I890 Lymphedema, not elsewhere classified: Secondary | ICD-10-CM

## 2016-04-26 DIAGNOSIS — M6281 Muscle weakness (generalized): Secondary | ICD-10-CM | POA: Diagnosis not present

## 2016-04-26 NOTE — Therapy (Signed)
[place], [place], [address] Phone: [phone]   Fax:  [phone]  Physical Therapy Treatment  Patient Details  Name: Alexandra Williams MRN: [id number] Date of Birth: 05-31-1941 Referring Provider: [name]  Encounter Date: 04/26/2016      PT End of Session - 04/26/16 1603    Visit Number 15   Number of Visits 25   Date for PT Re-Evaluation 05/04/16   PT Start Time 5456   PT Stop Time 1604   PT Time Calculation (min) 88 min   Activity Tolerance Patient tolerated treatment well   Behavior During Therapy [facility] for tasks assessed/performed      Past Medical History  Diagnosis Date  . Abnormal Pap smear 4/82    colpo/ecc negative  . Fibrocystic breast   . Uterine fibroid   . Cervical polyp 1/99  . Postmenopausal bleeding 3/99    on HRT/endometrial biopsy-focal simple hyperplasia  . Colon polyps   . Hypertension   . Elevated lipids   . Vitamin D deficiency   . Arthritis     knee  . Hives     undetermined-on 7 antihistamines  . Hyperlipidemia   . Vasculitis ([place])     bilat legs    Past Surgical History  Procedure Laterality Date  . Tonsillectomy and adenoidectomy      as a child  . Incision and drainage      Bartholin cyst    There were no vitals filed for this visit.      Subjective Assessment - 04/26/16 1455    Subjective I've done all my research and am ready to order the compression garments today, just had a few more questions before I do. Also spoke with the Flexitouch rep again and I am going to do the trial and decide from there but will probably get that in the next month or so.  I left my Monday bandages on until late last night, had a pain at my anterior ankle and it felt better after itook it off.    Pertinent History Pt reports she began having swelling over 50 years ago when she was pregnant. She had her first cellulitis infection at that time. Her leg swelling  would come and go until the past three to four years it has not gotten any better. Pt was treated for a cellulitis infection in October of 2016. Pt reports she does not have any heart or kidney problems, no family history of lymphedema, pt is in need of knee replacements and reports her knees buckle occasionally and that is why she uses a cane   Patient Stated Goals to get the fluid out of my legs and to strengthen my legs, be more comfortable when I walk   Currently in Pain? No/denies               LYMPHEDEMA/ONCOLOGY QUESTIONNAIRE - 04/26/16 1457    Left Lower Extremity Lymphedema   30 cm Proximal to Suprapatella 74.6 cm   20 cm Proximal to Suprapatella 69.2 cm   10 cm Proximal to Suprapatella 65.2 cm   At Midpatella/Popliteal Crease 58.6 cm   30 cm Proximal to Floor at Lateral Plantar Foot 51.7 cm   20 cm Proximal to Floor at Lateral Plantar Foot 45.4 cm   10 cm Proximal to Floor at Lateral Malleoli 37.4 cm   5 cm Proximal to 1st MTP Joint 21.8 cm   Around Proximal Great Toe 7.5 cm  Other Measurements were taken from floor today so not accurate compared to >2 weeks ago, will correct at next measurment to take 10 cm above lat malleolous                  [facility] Adult PT Treatment/Exercise - 04/26/16 0001    Manual Therapy   Manual Lymphatic Drainage (MLD) Short neck, superficial and deep abdomen, stimulation of left axillary nodes and establishment of inguino-axillary pathway, Lt LE working from thigh distally to foot moving fluid laterally and directing towards pathway,,then to right sidelying for posterior left leg, hip and back    Compression Bandaging To Lt LE: Biotone lotion applied, thick stockinette in 2 pieces to left thigh and lower leg. , Artiflex to knee with 1/4" gray foam at posterior ankle, 1-8 cm and 1-10 cm and 2 12 cm short stretch compression bandages from foot to knee.  thigh piece of tg soft brought down to cover knee and golds gen noprene band placed on  thigh to support medial knee thigh lobule                    Short Term Clinic Goals - 04/26/16 1724    CC Short Term Goal  #1   Title Pt will demonstrate a 2 cm reduction 10 cm proximal to lateral malleolus on LLE to decrease risk of cellulitis   Status Achieved   CC Short Term Goal  #2   Title Pt will demonstrate a 2 cm reduction at left popliteal crease to decrease risk of cellulitis   Baseline 54 cm, 54.5 at  4/7 /2017, 58.6 cm 04/05/16, 58.9 cm 04/12/16, 58.6 cm 04/26/16   Status On-going   CC Short Term Goal  #3   Title Pt will demonstate a 2 cm decrease in edema 30 cm proximal to supra patella    Baseline 77 cm, 76.4 cm 04/05/16, 75.4 cm 04/12/16, 74.6 cm 04/26/16   Status Achieved             Long Term Clinic Goals - 04/26/16 1725    CC Long Term Goal  #1   Title Pt will demonstrate a 4 cm decrease in circumference 10 cm proximal to left lateral malleolus to decrease risk of cellulitis   Status Achieved   CC Long Term Goal  #2   Title Pt will demonstrate a 3 cm decrease in edema at left popliteal crease to improve mobility   Baseline 54, 58.6 cm 04/05/16, 58.9 04/12/16, 58.6 cm 04/26/16    Status On-going   CC Long Term Goal  #3   Title Pt will demonstrate a 4 cm reduction in edema 30 cm proximal to suprapatella to decrease risk of cellulitis   Baseline 77 cm, 76.4 cm 04/05/16, 75.4 cm 04/12/16, 74.6 cm 04/26/16   Status Partially Met   CC Long Term Goal  #4   Title Pt will be fitted with an appropriate compression garment for long term management of edema  Pt ordering these today   Status Partially Met   CC Long Term Goal  #5   Title Pt will demonstrate improved skin texture as noted by increased mobility of skin of left lower leg and decreased redness   Status Achieved   CC Long Term Goal  #6   Title Pt will improve LLIS score to less than 20 percent impairment   Status On-going            Plan - 04/26/16 1712  Clinical Impression Statement Pt had  since gotten new information from [place] and reports she will get it but probably not for another month or so. She did, however, set up an appointment for a demonstration so she will no for sure how she feels about getting one. She printed off order form and brought in to make sure one more time that she had the right size picked and I reconfirmed everything with her so she reports will order those today and they appear to be in stock so should ship and get to pt pretty quickly. Pt agreeable to D/C once she gets them and can bring them in one time for [place] to instruct her in proper donning. Her circumference meaurements were greatly reduced at her lower leg today as she was able to stay in her compression bandages until last night.  Pt has made excellent progress and is almost ready for D/C, possibly next week.   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 Cont MLD to bilateral LEs as time allows;  bandaging to Lt LE from foot to knee and Golds gym band with TG soft to upper thigh. Assess if pt ordered her compression garments. Instruct daughter to bandage if she comes.    Consulted and Agree with Plan of Care Patient      Patient will benefit from skilled therapeutic intervention in order to improve the following deficits and impairments:  Decreased strength, Increased edema  Visit Diagnosis: Lymphedema, not elsewhere classified     Problem List Patient Active Problem List   Diagnosis Date Noted  . Morbid obesity ([place]) 10/08/2014  . Vasculitis ([place]) 10/08/2014  . Essential hypertension 10/08/2014  . Swelling of limb  02/18/2014  . Lymphedema 02/18/2014  . Mixed hyperlipidemia 05/14/2008  . COUGH 05/14/2008    Alexandra Williams, PTA 04/26/2016, 5:27 PM  [place], [place], [address] Phone: [phone]   Fax:  [phone]  Name: Alexandra Williams MRN: [id number] Date of Birth: Aug 25, 1941

## 2016-05-01 DIAGNOSIS — I10 Essential (primary) hypertension: Secondary | ICD-10-CM | POA: Diagnosis not present

## 2016-05-01 DIAGNOSIS — E782 Mixed hyperlipidemia: Secondary | ICD-10-CM | POA: Diagnosis not present

## 2016-05-01 DIAGNOSIS — L501 Idiopathic urticaria: Secondary | ICD-10-CM | POA: Diagnosis not present

## 2016-05-01 DIAGNOSIS — I890 Lymphedema, not elsewhere classified: Secondary | ICD-10-CM | POA: Diagnosis not present

## 2016-05-01 DIAGNOSIS — Z0000 Encounter for general adult medical examination without abnormal findings: Secondary | ICD-10-CM | POA: Diagnosis not present

## 2016-05-01 DIAGNOSIS — E119 Type 2 diabetes mellitus without complications: Secondary | ICD-10-CM | POA: Diagnosis not present

## 2016-05-02 ENCOUNTER — Encounter: Payer: Self-pay | Admitting: Physical Therapy

## 2016-05-02 ENCOUNTER — Ambulatory Visit: Payer: Medicare Other | Admitting: Physical Therapy

## 2016-05-02 DIAGNOSIS — M6281 Muscle weakness (generalized): Secondary | ICD-10-CM | POA: Diagnosis not present

## 2016-05-02 DIAGNOSIS — I890 Lymphedema, not elsewhere classified: Secondary | ICD-10-CM | POA: Diagnosis not present

## 2016-05-02 NOTE — Therapy (Signed)
[place], [place], [address] Phone: [phone]   Fax:  [phone]  Physical Therapy Treatment  Patient Details  Name: Alexandra Williams MRN: [id number] Date of Birth: 09/01/1941 Referring Provider: [name]  Encounter Date: 05/02/2016      PT End of Session - 05/02/16 1504    Visit Number 16   Number of Visits 25   Date for PT Re-Evaluation 05/04/16   Authorization Type April cert performed   PT Start Time 1306   PT Stop Time 1349   PT Time Calculation (min) 43 min   Activity Tolerance Patient tolerated treatment well   Behavior During Therapy [facility] for tasks assessed/performed      Past Medical History  Diagnosis Date  . Abnormal Pap smear 4/82    colpo/ecc negative  . Fibrocystic breast   . Uterine fibroid   . Cervical polyp 1/99  . Postmenopausal bleeding 3/99    on HRT/endometrial biopsy-focal simple hyperplasia  . Colon polyps   . Hypertension   . Elevated lipids   . Vitamin D deficiency   . Arthritis     knee  . Hives     undetermined-on 7 antihistamines  . Hyperlipidemia   . Vasculitis ([place])     bilat legs    Past Surgical History  Procedure Laterality Date  . Tonsillectomy and adenoidectomy      as a child  . Incision and drainage      Bartholin cyst    There were no vitals filed for this visit.      Subjective Assessment - 05/02/16 1310    Subjective The velcro garments have been ordered and they should come in tomorrow or Thursday.   Pertinent History Pt reports she began having swelling over 50 years ago when she was pregnant. She had her first cellulitis infection at that time. Her leg swelling would come and go until the past three to four years it has not gotten any better. Pt was treated for a cellulitis infection in October of 2016. Pt reports she does not have any heart or kidney problems, no family history of lymphedema, pt is in need of knee  replacements and reports her knees buckle occasionally and that is why she uses a cane   Patient Stated Goals to get the fluid out of my legs and to strengthen my legs, be more comfortable when I walk   Currently in Pain? No/denies                         [facility] Adult PT Treatment/Exercise - 05/02/16 0001    Manual Therapy   Manual Lymphatic Drainage (MLD) Short neck, superficial and deep abdomen, stimulation of left axillary nodes and establishment of inguino-axillary pathway, then stimulation of right axillary nodes and establishment of inguino-axillary pathway   Compression Bandaging To Lt LE: Biotone lotion applied, thick stockinette in 2 pieces to left thigh and lower leg. , Artiflex to knee with 1/4" gray foam at posterior ankle, 1-8 cm and 1-10 cm and 2 12 cm short stretch compression bandages from foot to knee.  thigh piece of tg soft brought down to cover knee and golds gen noprene band placed on thigh to support medial knee thigh lobule                 PT Education - 05/02/16 1504    Education provided Yes   Education Details how to apply  compression bandaging   Person(s) Educated Patient;Child(ren)   Methods Explanation;Demonstration   Comprehension Verbalized understanding           Short Term Clinic Goals - 04/26/16 1724    CC Short Term Goal  #1   Title Pt will demonstrate a 2 cm reduction 10 cm proximal to lateral malleolus on LLE to decrease risk of cellulitis   Status Achieved   CC Short Term Goal  #2   Title Pt will demonstrate a 2 cm reduction at left popliteal crease to decrease risk of cellulitis   Baseline 54 cm, 54.5 at  4/7 /2017, 58.6 cm 04/05/16, 58.9 cm 04/12/16, 58.6 cm 04/26/16   Status On-going   CC Short Term Goal  #3   Title Pt will demonstate a 2 cm decrease in edema 30 cm proximal to supra patella    Baseline 77 cm, 76.4 cm 04/05/16, 75.4 cm 04/12/16, 74.6 cm 04/26/16   Status Achieved             Long Term Clinic Goals -  04/26/16 1725    CC Long Term Goal  #1   Title Pt will demonstrate a 4 cm decrease in circumference 10 cm proximal to left lateral malleolus to decrease risk of cellulitis   Status Achieved   CC Long Term Goal  #2   Title Pt will demonstrate a 3 cm decrease in edema at left popliteal crease to improve mobility   Baseline 54, 58.6 cm 04/05/16, 58.9 04/12/16, 58.6 cm 04/26/16    Status On-going   CC Long Term Goal  #3   Title Pt will demonstrate a 4 cm reduction in edema 30 cm proximal to suprapatella to decrease risk of cellulitis   Baseline 77 cm, 76.4 cm 04/05/16, 75.4 cm 04/12/16, 74.6 cm 04/26/16   Status Partially Met   CC Long Term Goal  #4   Title Pt will be fitted with an appropriate compression garment for long term management of edema  Pt ordering these today   Status Partially Met   CC Long Term Goal  #5   Title Pt will demonstrate improved skin texture as noted by increased mobility of skin of left lower leg and decreased redness   Status Achieved   CC Long Term Goal  #6   Title Pt will improve LLIS score to less than 20 percent impairment   Status On-going            Plan - 05/02/16 1504    Clinical Impression Statement Pt had a shortned session today. She is awaiting arrival of her velcro compression garments. Pt's daughter as well as pt were instructed in how to apply compression bandages to left lower extremity. Pt and her daughter verbalized understanding. Pt will be instructed in how to don/doff velcro compression garment once it arrives.    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    PT Frequency 3x / week   PT Duration 8 weeks   PT Treatment/Interventions Compression bandaging;Manual techniques;Manual lymph drainage;Therapeutic exercise;Therapeutic activities   PT Next Visit Plan perform recert, assess goals, give LLIS, MLD to bilateral LE, instruct in  donning velcro compression garments if pt has received   Consulted and Agree with Plan of Care Patient      Patient will benefit from skilled therapeutic intervention in order to improve the following deficits and impairments:  Decreased strength,  Increased edema  Visit Diagnosis: Lymphedema, not elsewhere classified     Problem List Patient Active Problem List   Diagnosis Date Noted  . Morbid obesity ([place]) 10/08/2014  . Vasculitis ([place]) 10/08/2014  . Essential hypertension 10/08/2014  . Swelling of limb 02/18/2014  . Lymphedema 02/18/2014  . Mixed hyperlipidemia 05/14/2008  . COUGH 05/14/2008    Alexia Freestone 05/02/2016, 3:08 PM  [place], [place], [address] Phone: [phone]   Fax:  [phone]  Name: JOSE ALLEYNE MRN: [id number] Date of Birth: 09/07/1941    Allyson Sabal, PT 05/02/2016 3:08 PM

## 2016-05-04 ENCOUNTER — Ambulatory Visit: Payer: Medicare Other | Admitting: Physical Therapy

## 2016-05-04 ENCOUNTER — Telehealth: Payer: Self-pay | Admitting: Obstetrics & Gynecology

## 2016-05-04 NOTE — Telephone Encounter (Signed)
Patient was referred by Dr.Miller to [facility] on 02/29/2016 for lymphedema. [facility] calling to request Dr.Miller's signature for equipment for the patient.   Routing to [place] for review and advise. Okay for Herbie Baltimore to come by the office with forms today?

## 2016-05-04 NOTE — Telephone Encounter (Signed)
Orders signed by Dr.Miller and given back to Donneta Romberg from [facility].  Routing to provider for final review. Patient agreeable to disposition. Will close encounter.

## 2016-05-04 NOTE — Telephone Encounter (Signed)
Yes.  That is fine.  I will hopefully be able to sign it while she is here.

## 2016-05-04 NOTE — Telephone Encounter (Signed)
Spoke with Donneta Romberg from [facility]. Advised can stop by the office today for orders to be signed by [place]. Herbie Baltimore is agreeable and will come by the office today with orders.

## 2016-05-04 NOTE — Telephone Encounter (Signed)
Alexandra Williams from [facility] called requesting to come by the office today to have Dr. Sabra Heck sign an order for some equipment for the patient.

## 2016-05-15 ENCOUNTER — Other Ambulatory Visit: Payer: Self-pay | Admitting: Interventional Cardiology

## 2016-05-18 ENCOUNTER — Other Ambulatory Visit: Payer: Self-pay | Admitting: Interventional Cardiology

## 2016-05-18 ENCOUNTER — Ambulatory Visit: Payer: Medicare Other | Attending: Obstetrics & Gynecology

## 2016-05-18 DIAGNOSIS — M6281 Muscle weakness (generalized): Secondary | ICD-10-CM | POA: Diagnosis not present

## 2016-05-18 DIAGNOSIS — I890 Lymphedema, not elsewhere classified: Secondary | ICD-10-CM

## 2016-05-18 NOTE — Therapy (Signed)
[place], [place], [address] Phone: [phone]   Fax:  [phone]  Physical Therapy Treatment  Patient Details  Name: Alexandra Williams MRN: [id number] Date of Birth: 1941/02/21 Referring Provider: [name]  Encounter Date: 05/18/2016      PT End of Session - 05/18/16 1020    Visit Number 17   Number of Visits 25   Date for PT Re-Evaluation 06/15/16   PT Start Time 0934   PT Stop Time 1020   PT Time Calculation (min) 46 min   Activity Tolerance Patient tolerated treatment well   Behavior During Therapy [facility] for tasks assessed/performed      Past Medical History  Diagnosis Date  . Abnormal Pap smear 4/82    colpo/ecc negative  . Fibrocystic breast   . Uterine fibroid   . Cervical polyp 1/99  . Postmenopausal bleeding 3/99    on HRT/endometrial biopsy-focal simple hyperplasia  . Colon polyps   . Hypertension   . Elevated lipids   . Vitamin D deficiency   . Arthritis     knee  . Hives     undetermined-on 7 antihistamines  . Hyperlipidemia   . Vasculitis ([place])     bilat legs    Past Surgical History  Procedure Laterality Date  . Tonsillectomy and adenoidectomy      as a child  . Incision and drainage      Bartholin cyst    There were no vitals filed for this visit.      Subjective Assessment - 05/18/16 0938    Subjective I'm sorry I missed the last 2 weeks, my husband passed away the morning I was coming to my appt. We (my daughter and I) are doing okay, he passed away in his sleep. My velcro garments arrived a few weeks ago but I haven't messed with them yet.    Pertinent History Pt reports she began having swelling over 50 years ago when she was pregnant. She had her first cellulitis infection at that time. Her leg swelling would come and go until the past three to four years it has not gotten any better. Pt was treated for a cellulitis infection in October of 2016. Pt  reports she does not have any heart or kidney problems, no family history of lymphedema, pt is in need of knee replacements and reports her knees buckle occasionally and that is why she uses a cane   Patient Stated Goals to get the fluid out of my legs and to strengthen my legs, be more comfortable when I walk   Currently in Pain? No/denies                         [facility] Adult PT Treatment/Exercise - 05/18/16 0001    Manual Therapy   Compression Bandaging Pt brought her new Medi velcro compression garments and instructed pt in proper donning/doffing techniques for these for seesion.                    Short Term Clinic Goals - 05/18/16 1114    CC Short Term Goal  #1   Title Pt will demonstrate a 2 cm reduction 10 cm proximal to lateral malleolus on LLE to decrease risk of cellulitis   Status Achieved   CC Short Term Goal  #2   Title Pt will demonstrate a 2 cm reduction at left popliteal crease to decrease risk of cellulitis  Baseline 54 cm, 54.5 at  4/7 /2017, 58.6 cm 04/05/16, 58.9 cm 04/12/16, 58.6 cm 04/26/16   Status On-going   CC Short Term Goal  #3   Title Pt will demonstate a 2 cm decrease in edema 30 cm proximal to supra patella    Status Achieved             Long Term Clinic Goals - 05/18/16 1115    CC Long Term Goal  #1   Title Pt will demonstrate a 4 cm decrease in circumference 10 cm proximal to left lateral malleolus to decrease risk of cellulitis   Status Achieved   CC Long Term Goal  #2   Title Pt will demonstrate a 3 cm decrease in edema at left popliteal crease to improve mobility   Baseline 54, 58.6 cm 04/05/16, 58.9 04/12/16, 58.6 cm 04/26/16    Status On-going   CC Long Term Goal  #3   Title Pt will demonstrate a 4 cm reduction in edema 30 cm proximal to suprapatella to decrease risk of cellulitis   Baseline 77 cm, 76.4 cm 04/05/16, 75.4 cm 04/12/16, 74.6 cm 04/26/16   Status Partially Met   CC Long Term Goal  #4   Title Pt will be  fitted with an appropriate compression garment for long term management of edema   Status Achieved   CC Long Term Goal  #5   Title Pt will demonstrate improved skin texture as noted by increased mobility of skin of left lower leg and decreased redness   Status Achieved   CC Long Term Goal  #6   Title Pt will improve LLIS score to less than 20 percent impairment            Plan - 05/18/16 1020    Clinical Impression Statement Did not have time to take pts circumference mesaurements today due to pt brought her new Medi Ju[employer] Essentials today for her Lt LE and spent session applying and instructing pt in proper donning/doffing techniques. She verbalized good basic understanding of this and wore her compression garments out of clinic. Also pt wanted to wear extra liner to knee and compression sock on Rt LE and maximally instructed pt to keep an eye on sock rolling down into ankle or cutting into mid lower leg and she verbalized understanding this as well. Renewal done this visit to continue for 1x/week up to 4 weeks with next session being 90 mins for circumference, MLD and continued practice donning/doffing garments prn. Then subsequent visits to be 45 mins prn for practice with donning/doffing and MLD.   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 Review proper donning/doffing with pt of Medi velcro compression garments and assess skin tolerance to these. Remeasure circumference and perform MLD as time allows.    Consulted and Agree with Plan of Care Patient      Patient will benefit from skilled therapeutic intervention in  order to improve the following deficits and impairments:  Decreased strength, Increased edema  Visit Diagnosis: Lymphedema, not elsewhere classified  Muscle weakness (generalized)     Problem List Patient Active Problem List   Diagnosis Date Noted  . Morbid  obesity ([place]) 10/08/2014  . Vasculitis ([place]) 10/08/2014  . Essential hypertension 10/08/2014  . Swelling of limb 02/18/2014  . Lymphedema 02/18/2014  . Mixed hyperlipidemia 05/14/2008  . COUGH 05/14/2008    Otelia Limes, PTA 05/18/2016, 12:15 PM  [place], [place], [address] Phone: [phone]   Fax:  [phone]  Name: Alexandra Williams MRN: [id number] Date of Birth: 06/20/1941

## 2016-05-22 DIAGNOSIS — J301 Allergic rhinitis due to pollen: Secondary | ICD-10-CM | POA: Diagnosis not present

## 2016-05-22 DIAGNOSIS — L501 Idiopathic urticaria: Secondary | ICD-10-CM | POA: Diagnosis not present

## 2016-05-22 DIAGNOSIS — T783XXA Angioneurotic edema, initial encounter: Secondary | ICD-10-CM | POA: Diagnosis not present

## 2016-05-22 DIAGNOSIS — J3089 Other allergic rhinitis: Secondary | ICD-10-CM | POA: Diagnosis not present

## 2016-05-22 NOTE — Addendum Note (Signed)
Addended by: Toribio Harbour on: 05/22/2016 08:49 AM   Modules accepted: Orders

## 2016-05-24 ENCOUNTER — Ambulatory Visit: Payer: Medicare Other | Admitting: Physical Therapy

## 2016-05-24 ENCOUNTER — Encounter: Payer: Self-pay | Admitting: Physical Therapy

## 2016-05-24 DIAGNOSIS — I890 Lymphedema, not elsewhere classified: Secondary | ICD-10-CM | POA: Diagnosis not present

## 2016-05-24 DIAGNOSIS — M6281 Muscle weakness (generalized): Secondary | ICD-10-CM | POA: Diagnosis not present

## 2016-05-24 NOTE — Therapy (Signed)
[place], [place], [address] Phone: [phone]   Fax:  [phone]  Physical Therapy Treatment  Patient Details  Name: Alexandra Williams MRN: [id number] Date of Birth: Aug 24, 1941 Referring Provider: [name]  Encounter Date: 05/24/2016      PT End of Session - 05/24/16 1534    Visit Number 18   Number of Visits 25   Date for PT Re-Evaluation 06/15/16   Authorization Type April cert performed   PT Start Time 1300   PT Stop Time 1435   PT Time Calculation (min) 95 min   Activity Tolerance Patient tolerated treatment well   Behavior During Therapy [facility] for tasks assessed/performed      Past Medical History  Diagnosis Date  . Abnormal Pap smear 4/82    colpo/ecc negative  . Fibrocystic breast   . Uterine fibroid   . Cervical polyp 1/99  . Postmenopausal bleeding 3/99    on HRT/endometrial biopsy-focal simple hyperplasia  . Colon polyps   . Hypertension   . Elevated lipids   . Vitamin D deficiency   . Arthritis     knee  . Hives     undetermined-on 7 antihistamines  . Hyperlipidemia   . Vasculitis ([place])     bilat legs    Past Surgical History  Procedure Laterality Date  . Tonsillectomy and adenoidectomy      as a child  . Incision and drainage      Bartholin cyst    There were no vitals filed for this visit.      Subjective Assessment - 05/24/16 1306    Subjective Pt states she left the velcro compression garments on for a little over 24 hours after last session. She states when she removed them she had sorenss in her legs especially in the bend of her ankle and reports it left redness in this area for several hours. She has not attempted to re don her garments.    Pertinent History Pt reports she began having swelling over 50 years ago when she was pregnant. She had her first cellulitis infection at that time. Her leg swelling would come and go until the past three to four  years it has not gotten any better. Pt was treated for a cellulitis infection in October of 2016. Pt reports she does not have any heart or kidney problems, no family history of lymphedema, pt is in need of knee replacements and reports her knees buckle occasionally and that is why she uses a cane   Patient Stated Goals to get the fluid out of my legs and to strengthen my legs, be more comfortable when I walk   Currently in Pain? No/denies               LYMPHEDEMA/ONCOLOGY QUESTIONNAIRE - 05/24/16 1528    Left Lower Extremity Lymphedema   Other Measurements were taken from floor today so not accurate compared to >2 weeks ago, will correct at next measurment to take 10 cm above lat malleolous                  [facility] Adult PT Treatment/Exercise - 05/24/16 0001    Manual Therapy   Manual Lymphatic Drainage (MLD) Short neck, superficial and deep abdomen, stimulation of left axillary nodes and establishment of inguino-axillary pathway, Lt LE working from thigh distally to foot moving fluid laterally and directing towards pathway,, then same for Rt LE   Compression Bandaging Pt brought her  Medi velcro compression garments and instructed pt in proper donning/doffing techniques for these and was able to return demonstrate                   Short Term Clinic Goals - 05/24/16 1531    CC Short Term Goal  #1   Title Pt will demonstrate a 2 cm reduction 10 cm proximal to lateral malleolus on LLE to decrease risk of cellulitis   Baseline ** notice measurements was taken 10 cm proximal to lateral mallolus - 46.8 cm,  41cm 03/24/2016, 45.2 cm 04/05/16 (out of bandages 27 hours), 42 cm 04/12/16 (out of bandages 14 hours)   Status Achieved   CC Short Term Goal  #2   Title Pt will demonstrate a 2 cm reduction at left popliteal crease to decrease risk of cellulitis   Baseline 54 cm, 54.5 at  4/7 /2017, 58.6 cm 04/05/16, 58.9 cm 04/12/16, 58.6 cm 04/26/16, 05/24/16-58.8   Time 4   Period  Weeks   Status On-going   CC Short Term Goal  #3   Title Pt will demonstate a 2 cm decrease in edema 30 cm proximal to supra patella    Baseline 77 cm, 76.4 cm 04/05/16, 75.4 cm 04/12/16, 74.6 cm 04/26/16   Status Achieved             Long Term Clinic Goals - 05/24/16 1532    CC Long Term Goal  #1   Title Pt will demonstrate a 4 cm decrease in circumference 10 cm proximal to left lateral malleolus to decrease risk of cellulitis   Baseline ** notice measurements taken 10 cm proximal to lateral malleolus not at floor 46.8 cm, 45.2 04/05/16, 42 cm 04/12/16   Status Achieved   CC Long Term Goal  #2   Title Pt will demonstrate a 3 cm decrease in edema at left popliteal crease to improve mobility   Baseline 54, 58.6 cm 04/05/16, 58.9 04/12/16, 58.6 cm 04/26/16 , 05/24/16-58.8   Time 8   Period Weeks   Status On-going   CC Long Term Goal  #3   Title Pt will demonstrate a 4 cm reduction in edema 30 cm proximal to suprapatella to decrease risk of cellulitis   Baseline 77 cm, 76.4 cm 04/05/16, 75.4 cm 04/12/16, 74.6 cm 04/26/16, 75cm on 05/24/16   Status On-going   CC Long Term Goal  #4   Title Pt will be fitted with an appropriate compression garment for long term management of edema   Status Achieved   CC Long Term Goal  #5   Title Pt will demonstrate improved skin texture as noted by increased mobility of skin of left lower leg and decreased redness   Status Achieved   CC Long Term Goal  #6   Title Pt will improve LLIS score to less than 20 percent impairment   Status On-going            Plan - 05/24/16 1535    Clinical Impression Statement Circumferential measurements taken today. Pt has not had her garments on since 24 hours after they were donned last session which is mostly likely why her measurements have not decreased any. She had some pain in her legs and redness when she removed garments last session. She was educated to loosen straps when this happens. She was also educated not to  wear a compression anklet to help decrease discomfort on anterior ankle bend. Pt's foot garment was cut to help improve fit. The  length may need to be trimmed next session to further improve comfort. Pt requires additiional visits to ensure independence with compression garments.    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 (her husband passed away June 03, 2016)   PT Frequency 1x / week   PT Duration 4 weeks   PT Treatment/Interventions Compression bandaging;Manual techniques;Manual lymph drainage;Therapeutic exercise;Therapeutic activities   PT Next Visit Plan review proper donning/doffing of garment, MLD to bilateral LEs as time allows, possibly trim length of foot garment if still uncomfortable   Consulted and Agree with Plan of Care Patient      Patient will benefit from skilled therapeutic intervention in order to improve the following deficits and impairments:  Decreased strength, Increased edema  Visit Diagnosis: Lymphedema, not elsewhere classified     Problem List Patient Active Problem List   Diagnosis Date Noted  . Morbid obesity ([place]) 10/08/2014  . Vasculitis ([place]) 10/08/2014  . Essential hypertension 10/08/2014  . Swelling of limb 02/18/2014  . Lymphedema 02/18/2014  . Mixed hyperlipidemia 05/14/2008  . COUGH 05/14/2008    Alexia Freestone 05/24/2016, 3:39 PM  [place], [place], [address] Phone: [phone]   Fax:  [phone]  Name: Alexandra Williams MRN: [id number] Date of Birth: 09/04/1941    Allyson Sabal, PT 05/24/2016 3:39 PM

## 2016-06-01 ENCOUNTER — Ambulatory Visit: Payer: Medicare Other

## 2016-06-01 DIAGNOSIS — M6281 Muscle weakness (generalized): Secondary | ICD-10-CM | POA: Diagnosis not present

## 2016-06-01 DIAGNOSIS — I890 Lymphedema, not elsewhere classified: Secondary | ICD-10-CM | POA: Diagnosis not present

## 2016-06-01 NOTE — Therapy (Signed)
[facility] [address] [place], [place], [address] Phone: [phone]   Fax:  [phone]  Physical Therapy Treatment  Patient Details  Name: Alexandra Williams MRN: [id number] Date of Birth: Mar 04, 1941 Referring Provider: Leda Quail  Encounter Date: 06/01/2016      PT End of Session - 06/01/16 1208    Visit Number 19   Number of Visits 25   Date for PT Re-Evaluation 06/15/16   PT Start Time 1105   PT Stop Time 1159   PT Time Calculation (min) 54 min   Activity Tolerance Patient tolerated treatment well   Behavior During Therapy [facility] for tasks assessed/performed      Past Medical History  Diagnosis Date  . Abnormal Pap smear 4/82    colpo/ecc negative  . Fibrocystic breast   . Uterine fibroid   . Cervical polyp 1/99  . Postmenopausal bleeding 3/99    on HRT/endometrial biopsy-focal simple hyperplasia  . Colon polyps   . Hypertension   . Elevated lipids   . Vitamin D deficiency   . Arthritis     knee  . Hives     undetermined-on 7 antihistamines  . Hyperlipidemia   . Vasculitis (HCC)     bilat legs    Past Surgical History  Procedure Laterality Date  . Tonsillectomy and adenoidectomy      as a child  . Incision and drainage      Bartholin cyst    There were no vitals filed for this visit.      Subjective Assessment - 06/01/16 1117    Subjective I mixed things up a little with my compression. I wrapped my foot Sunday, and then again Monday with the bandages, used the velcro on my lower leg, and then one more bandage above it to get it to covre to the knee. My leg looked so good both times when I took it off and my skin tolerated that well as well.    Pertinent History Pt reports she began having swelling over 50 years ago when she was pregnant. She had her first cellulitis infection at that time. Her leg swelling would come and go until the past three to four years it has not gotten any better. Pt  was treated for a cellulitis infection in October of 2016. Pt reports she does not have any heart or kidney problems, no family history of lymphedema, pt is in need of knee replacements and reports her knees buckle occasionally and that is why she uses a cane   Patient Stated Goals to get the fluid out of my legs and to strengthen my legs, be more comfortable when I walk   Currently in Pain? No/denies               LYMPHEDEMA/ONCOLOGY QUESTIONNAIRE - 06/01/16 1121    Left Lower Extremity Lymphedema   30 cm Proximal to Suprapatella 73.9 cm   20 cm Proximal to Suprapatella 68 cm   10 cm Proximal to Suprapatella 63.9 cm   At Midpatella/Popliteal Crease 58.5 cm   30 cm Proximal to Floor at Lateral Plantar Foot 51.8 cm  from proximal to lateral malleolous   20 cm Proximal to Floor at Lateral Plantar Foot 47.5 cm  from proximal to lateral malleoous   10 cm Proximal to Floor at Lateral Malleoli 43.1 cm  from proximal to lateral malleolous   5 cm Proximal to 1st MTP Joint 23.1 cm   Around Proximal Great Toe 8.2  cm                  [facility] Adult PT Treatment/Exercise - 06/01/16 0001    Manual Therapy   Manual Lymphatic Drainage (MLD) Short neck, superficial and deep abdomen, stimulation of left axillary nodes and establishment of inguino-axillary pathway, Lt LE working from thigh distally to foot moving fluid laterally and directing towards pathway.   Compression Bandaging Pt brought her Medi velcro compression garments. She had been having difficulty with the foot piece fitting properly so she has started bandaging her foot with an 8 cm short stretch bandage which therapist did for her today, then applied the lower leg piece, 1-12 cm bandage above that to knee making last revolutions loose fitting as the lower leg piece doesn't quite cover to knee and pt likes to wear no thigh piece at times during the day due to the heat. Today though she did want to wear it so donned the upper thigh  piece as well and pt reported it feeling like a good fit.                    Short Term Clinic Goals - 06/01/16 1213    CC Short Term Goal  #1   Title Pt will demonstrate a 2 cm reduction 10 cm proximal to lateral malleolus on LLE to decrease risk of cellulitis   Baseline ** notice measurements was taken 10 cm proximal to lateral mallolus - 46.8 cm,  41cm 03/24/2016, 45.2 cm 04/05/16 (out of bandages 27 hours), 42 cm 04/12/16 (out of bandages 14 hours), 43.1 cm 06/01/16   Status Achieved   CC Short Term Goal  #2   Title Pt will demonstrate a 2 cm reduction at left popliteal crease to decrease risk of cellulitis   Baseline 54 cm, 54.5 at  4/7 /2017, 58.6 cm 04/05/16, 58.9 cm 04/12/16, 58.6 cm 04/26/16, 05/24/16-58.8, 58.5 cm 06/01/16   Status On-going   CC Short Term Goal  #3   Title Pt will demonstate a 2 cm decrease in edema 30 cm proximal to supra patella    Baseline 77 cm, 76.4 cm 04/05/16, 75.4 cm 04/12/16, 74.6 cm 04/26/16, 73.9 cm 06/01/16   Status Achieved             Long Term Clinic Goals - 06/01/16 1215    CC Long Term Goal  #1   Title Pt will demonstrate a 4 cm decrease in circumference 10 cm proximal to left lateral malleolus to decrease risk of cellulitis   Baseline ** notice measurements taken 10 cm proximal to lateral malleolus not at floor 46.8 cm, 45.2 04/05/16, 42 cm 04/12/16, 43.1 cm 06/01/16 (3.7 cm reduction)   Status Partially Met   CC Long Term Goal  #2   Title Pt will demonstrate a 3 cm decrease in edema at left popliteal crease to improve mobility   Baseline 54, 58.6 cm 04/05/16, 58.9 04/12/16, 58.6 cm 04/26/16 , 05/24/16-58.8, 58.5 cm 06/01/16  Pt does not consistently keep compression on her thigh affecting this measurement.   Status On-going   CC Long Term Goal  #3   Title Pt will demonstrate a 4 cm reduction in edema 30 cm proximal to suprapatella to decrease risk of cellulitis   Baseline 77 cm, 76.4 cm 04/05/16, 75.4 cm 04/12/16, 74.6 cm 04/26/16, 75cm on 05/24/16,  73.9 cm 06/01/16 (3.1 cm reduction)   Status Partially Met   CC Long Term Goal  #4   Title  Pt will be fitted with an appropriate compression garment for long term management of edema   Status Achieved   CC Long Term Goal  #5   Title Pt will demonstrate improved skin texture as noted by increased mobility of skin of left lower leg and decreased redness   Status Achieved   CC Long Term Goal  #6   Title Pt will improve LLIS score to less than 20 percent impairment   Status On-going            Plan - 06/01/16 1208    Clinical Impression Statement Pt is doing well with velcro compression garments and has even started incorporating bandaging where the garment did not give her good enough compression. Today her skin integrity was noticeably imprved not being as dryas it has been in the past sessions. And her circumference measurements were all reduced from her initial measurements today. Pt is doing very well and would like to come 1-2x more over next 2 weeks to finalize donning/doffing techniques and consider possibilities for Rt LE. For now pt plans to begin wrapping her Rt LE.    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 Assess if pt ready for D/C.   Consulted and Agree with Plan of Care Patient      Patient will benefit from skilled therapeutic intervention in order to improve the following deficits and impairments:  Decreased strength, Increased edema  Visit Diagnosis: Lymphedema, not elsewhere classified  Muscle  weakness (generalized)     Problem List Patient Active Problem List   Diagnosis Date Noted  . Morbid obesity ([place]) 10/08/2014  . Vasculitis ([place]) 10/08/2014  . Essential hypertension 10/08/2014  . Swelling of limb 02/18/2014  . Lymphedema 02/18/2014  . Mixed hyperlipidemia 05/14/2008  . COUGH 05/14/2008    Otelia Limes, PTA 06/01/2016, 12:17 PM  [place], [place], [address] Phone: [phone]   Fax:  [phone]  Name: MADILYN CEPHAS MRN: [id number] Date of Birth: [birth year]

## 2016-06-06 ENCOUNTER — Ambulatory Visit: Payer: Medicare Other

## 2016-06-06 DIAGNOSIS — M6281 Muscle weakness (generalized): Secondary | ICD-10-CM

## 2016-06-06 DIAGNOSIS — I890 Lymphedema, not elsewhere classified: Secondary | ICD-10-CM

## 2016-06-06 NOTE — Therapy (Signed)
[place], [place], [address] Phone: [phone]   Fax:  [phone]  Physical Therapy Treatment  Patient Details  Name: Alexandra Williams MRN: [id number] Date of Birth: 01-06-1941 Referring Provider: [name]  Encounter Date: 06/06/2016      PT End of Session - 06/06/16 1221    Visit Number 20   Number of Visits 25   Date for PT Re-Evaluation 06/15/16   PT Start Time 1058   PT Stop Time 1203   PT Time Calculation (min) 65 min   Activity Tolerance Patient tolerated treatment well   Behavior During Therapy [facility] for tasks assessed/performed      Past Medical History  Diagnosis Date  . Abnormal Pap smear 4/82    colpo/ecc negative  . Fibrocystic breast   . Uterine fibroid   . Cervical polyp 1/99  . Postmenopausal bleeding 3/99    on HRT/endometrial biopsy-focal simple hyperplasia  . Colon polyps   . Hypertension   . Elevated lipids   . Vitamin D deficiency   . Arthritis     knee  . Hives     undetermined-on 7 antihistamines  . Hyperlipidemia   . Vasculitis ([place])     bilat legs    Past Surgical History  Procedure Laterality Date  . Tonsillectomy and adenoidectomy      as a child  . Incision and drainage      Bartholin cyst    There were no vitals filed for this visit.      Subjective Assessment - 06/06/16 1059    Subjective I feel like I'm doing well with my bandaging of the Rt LE.                LYMPHEDEMA/ONCOLOGY QUESTIONNAIRE - 06/06/16 1100    Left Lower Extremity Lymphedema   At Midpatella/Popliteal Crease 57.2 cm                  [facility] Adult PT Treatment/Exercise - 06/06/16 0001    Manual Therapy   Manual Lymphatic Drainage (MLD) Short neck, superficial and deep abdomen, stimulation of left axillary nodes and establishment of inguino-axillary pathway, Lt LE working from thigh distally to foot moving fluid laterally and directing towards  pathway.   Compression Bandaging 1-8 cm short stretch compression bandage to pts foot/ankle, Medi lower leg velcro compression garment, 1-12 cm over knee and lower thigh, then applied upper Medi velcro garment to thigh.                 PT Education - 06/06/16 1218    Education provided Yes   Education Details Reviewed with pt overall proper skin care and importance of to help prevent infection, reviewed proper donning techniques of her Medi velcro compression garments and how she can begin same treatment for her Rt LE.    Person(s) Educated Patient   Methods Explanation;Verbal cues   Comprehension Verbalized understanding           Short Term Clinic Goals - 06/06/16 1101    CC Short Term Goal  #1   Title Pt will demonstrate a 2 cm reduction 10 cm proximal to lateral malleolus on LLE to decrease risk of cellulitis   Status Achieved   CC Short Term Goal  #2   Title Pt will demonstrate a 2 cm reduction at left popliteal crease to decrease risk of cellulitis   Baseline 54 cm, 54.5 at  4/7 /2017, 58.6 cm 04/05/16, 58.9  cm 04/12/16, 58.6 cm 04/26/16, 05/24/16-58.8, 58.5 cm 06/01/16, 57.2 cm 06/06/16   Status On-going   CC Short Term Goal  #3   Title Pt will demonstate a 2 cm decrease in edema 30 cm proximal to supra patella    Status Achieved             Long Term Clinic Goals - 06/06/16 1103    CC Long Term Goal  #1   Title Pt will demonstrate a 4 cm decrease in circumference 10 cm proximal to left lateral malleolus to decrease risk of cellulitis   Baseline ** notice measurements taken 10 cm proximal to lateral malleolus not at floor 46.8 cm, 45.2 04/05/16, 42 cm 04/12/16, 43.1 cm 06/01/16 (3.7 cm reduction), 42.7 cm (4.1 cm reduction)   Status Achieved   CC Long Term Goal  #2   Title Pt will demonstrate a 3 cm decrease in edema at left popliteal crease to improve mobility   Baseline 54, 58.6 cm 04/05/16, 58.9 04/12/16, 58.6 cm 04/26/16 , 05/24/16-58.8, 58.5 cm 06/01/16, 57.2 cm (pt is  going to try to start bandaging to right above lobule here)   Status On-going   CC Long Term Goal  #3   Title Pt will demonstrate a 4 cm reduction in edema 30 cm proximal to suprapatella to decrease risk of cellulitis   Baseline 77 cm, 76.4 cm 04/05/16, 75.4 cm 04/12/16, 74.6 cm 04/26/16, 75cm on 05/24/16, 73.9 cm 06/01/16 (3.1 cm reduction), 72.9 cm 06/06/16 (4.1 cm reduction)   Status Achieved   CC Long Term Goal  #4   Title Pt will be fitted with an appropriate compression garment for long term management of edema   Status Achieved   CC Long Term Goal  #5   Title Pt will demonstrate improved skin texture as noted by increased mobility of skin of left lower leg and decreased redness   Status Achieved   CC Long Term Goal  #6   Title Pt will improve LLIS score to less than 20 percent impairment   Status Partially Met            Plan - 06/06/16 1221    Clinical Impression Statement Pt has done very well with her self care this past week and she has met all her circumference goals except the one at the knee, though this did reduce well from last week. Pt knows she needs to add compression bandaging to her knee to cover the lobule which seems to be her largest issue at the knee. This does continue to feel softer though than it did compared to at beginning of POC and pt reports noticing improvement here as well over past weeks. She has met all but 2 goals and is ready for D/C. Pt has also heard from compression pump company and she will be pursuing this soon as well.    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    Consulted and Agree with Plan of  Care Patient      Patient will benefit from skilled therapeutic intervention in order to improve the following deficits and impairments:  Decreased strength, Increased edema  Visit Diagnosis: Lymphedema, not elsewhere  classified  Muscle weakness (generalized)       G-Codes - 06-23-2016 1248    Functional Assessment Tool Used per clinical judgement   Functional Limitation Mobility: Walking and moving around   Mobility: Walking and Moving Around Goal Status [phone]) At least 20 percent but less than 40 percent impaired, limited or restricted   Mobility: Walking and Moving Around Discharge Status [phone]) At least 20 percent but less than 40 percent impaired, limited or restricted      Problem List Patient Active Problem List   Diagnosis Date Noted  . Morbid obesity ([place]) 10/08/2014  . Vasculitis ([place]) 10/08/2014  . Essential hypertension 10/08/2014  . Swelling of limb 02/18/2014  . Lymphedema 02/18/2014  . Mixed hyperlipidemia 05/14/2008  . COUGH 05/14/2008     Collie Siad , PTA 2016/06/23, 12:48 PM      PHYSICAL THERAPY DISCHARGE SUMMARY  Visits from Start of Care: 20  Current functional level related to goals / functional outcomes: As above    Remaining deficits: Ongoing lymphedema   Education / Equipment: Self manual lymph drainage, compression bandaging and use of compression garments, exercise Plan: Patient agrees to discharge.  Patient goals were partially met. Patient is being discharged due to being pleased with the current functional level.  ?????         Maudry Diego, PT Jun 23, 2016 12:48 PM [place], [place], [address] Phone: [phone]   Fax:  [phone]  Name: Alexandra Williams MRN: [id number] Date of Birth: [birth year]

## 2016-06-19 ENCOUNTER — Other Ambulatory Visit: Payer: Self-pay

## 2016-06-19 MED ORDER — ZOLPIDEM TARTRATE 5 MG PO TABS: 5.0000 mg | ORAL_TABLET | Freq: Every evening | ORAL | Status: DC | PRN

## 2016-06-19 NOTE — Telephone Encounter (Signed)
Medication refill request: Zolpidem 5mg   Last AEX:  10/12/15 PG Next AEX: 10/13/16 SM Last MMG (if hormonal medication request): 02/07/16 [id number] negative Refill authorized: 03/21/16 #30 w/2 refills; today #30 w/3 refills? Please advise

## 2016-06-21 NOTE — Telephone Encounter (Signed)
Rx was faxed 06/21/16 to pharmacy in [place]

## 2016-06-23 DIAGNOSIS — L501 Idiopathic urticaria: Secondary | ICD-10-CM | POA: Diagnosis not present

## 2016-07-07 ENCOUNTER — Other Ambulatory Visit: Payer: Self-pay | Admitting: Interventional Cardiology

## 2016-07-10 DIAGNOSIS — L501 Idiopathic urticaria: Secondary | ICD-10-CM | POA: Diagnosis not present

## 2016-07-12 ENCOUNTER — Encounter: Payer: Self-pay | Admitting: *Deleted

## 2016-07-12 NOTE — Progress Notes (Signed)
Patient ID: Alexandra Williams, female   DOB: September 15, 1941, 75 y.o.   MRN: [id number]     Cardiology Office Note   Date:  07/13/2016   ID:  Alexandra Williams, DOB 05-29-1941, MRN [id number]  PCP:  Aretta Nip, MD  Cardiologist:   Larae Grooms, MD   No chief complaint on file. f/u HTN    History of Present Illness: Alexandra Williams is a 75 y.o. female who presents for evaluation of worsening HTN.  She had syncope several years ago and a negative cardiac w/u at that time.  She had an echo at that time.   She has had elevated BP readings over the course of the last few months.  She has had readings in the 123456 systolic and diastolics in the low [id number].  Amlodipine was added and titrated.  Her BPs are better now but still in the 150/90s at time.     No chest pain. Walking limited by orthopedic problems. She has persistent lower extremity edema.  Her husband passed away in 2016-05-06.  SHe is trying to adjust.     Past Medical History:  Diagnosis Date  . Abnormal Pap smear 4/82   colpo/ecc negative  . Arthritis    knee  . Cervical polyp 1/99  . Colon polyps   . Elevated lipids   . Fibrocystic breast   . Hives    undetermined-on 7 antihistamines  . Hyperlipidemia   . Hypertension   . Postmenopausal bleeding 3/99   on HRT/endometrial biopsy-focal simple hyperplasia  . Uterine fibroid   . Vasculitis (HCC)    bilat legs  . Vitamin D deficiency     Past Surgical History:  Procedure Laterality Date  . INCISION AND DRAINAGE     Bartholin cyst  . TONSILLECTOMY AND ADENOIDECTOMY     as a child     Current Outpatient Prescriptions  Medication Sig Dispense Refill  . acetaminophen (RA ACETAMINOPHEN) 650 MG CR tablet Take 650 mg by mouth as needed for pain.     Marland Kitchen aspirin 81 MG tablet Take 81 mg by mouth daily.    Marland Kitchen atorvastatin (LIPITOR) 40 MG tablet take 1 tablet by mouth once daily 15 tablet 0  . doxepin (SINEQUAN) 10 MG capsule Take 10 mg by mouth 2 (two) times daily  as needed. Two caps daily    . EPIPEN 2-PAK 0.3 MG/0.3ML SOAJ Inject 0.3 mg into the muscle as needed (HIVES).     . hydrocortisone (ANUSOL-HC) 2.5 % rectal cream APPLY TO AFFECTED AREA RECTALLY DAILY AS DIRECTED BY YOUR DOCTOR  0  . hydrOXYzine (ATARAX/VISTARIL) 25 MG tablet Take 10 mg by mouth every 4 (four) hours as needed (HIVES).     Marland Kitchen levocetirizine (XYZAL) 5 MG tablet take 2 tablets by mouth once daily every morning  0  . losartan (COZAAR) 100 MG tablet Take 100 mg by mouth daily.    . metFORMIN (GLUCOPHAGE-XR) 500 MG 24 hr tablet Take 500 mg by mouth daily.  0  . metoprolol succinate (TOPROL-XL) 100 MG 24 hr tablet Take 100 mg by mouth daily.  0  . Omalizumab (XOLAIR [place]) Inject into the skin every 30 (thirty) days.    . predniSONE (DELTASONE) 10 MG tablet Take 10 mg by mouth See admin instructions. Reported on 03/09/2016  0  . ranitidine (ZANTAC) 150 MG tablet Take 150 mg by mouth at bedtime as needed for heartburn.     . triamcinolone ointment (KENALOG) 0.1 % Apply  1 application topically as needed (HIVES).   0  . zolpidem (AMBIEN) 5 MG tablet Take 1 tablet (5 mg total) by mouth at bedtime as needed for sleep. 30 tablet 2   No current facility-administered medications for this visit.     Allergies:   Codeine; Peanuts [peanut oil]; Shellfish allergy; Sulfites; Tessalon [benzonatate]; and Sulfur    Social History:  The patient  reports that she has never smoked. She has never used smokeless tobacco. She reports that she does not drink alcohol or use drugs.   Family History:  The patient's family history includes Cancer in her father, mother, and paternal aunt; Diabetes in her mother; Heart attack in her father; Heart disease in her father; Hyperlipidemia in her mother; Hypertension in her father and mother; Kidney disease in her father; Mental retardation in her daughter; Pancreatic cancer in her mother; Ulcers in her father.    ROS:  Please see the history of present illness.    Otherwise, review of systems are positive for joint pains.   All other systems are reviewed and negative.    PHYSICAL EXAM: VS:  BP (!) 150/80   Pulse 64   Ht 5' (1.524 m)   Wt 220 lb 1.9 oz (99.8 kg)   LMP 12/18/2000   BMI 42.99 kg/m  , BMI Body mass index is 42.99 kg/m. GEN: Well nourished, well developed, in no acute distress  HEENT: normal  Neck: no JVD, carotid bruits, or masses Cardiac: RRR; no murmurs, rubs, or gallops,no edema  Respiratory:  clear to auscultation bilaterally, normal work of breathing GI: soft, nontender, nondistended, + BS MS: no deformity or atrophy  Skin: warm and dry,; red rash on both legs; significant lower extremity edema Neuro:  Strength and sensation are intact Psych: euthymic mood, full affect   EKG:  EKG is ordered today. The ekg ordered today demonstrates sinus bradycardia, no ST segment changes   Recent Labs: No results found for requested labs within last 8760 hours.    Lipid Panel No results found for: CHOL, TRIG, HDL, CHOLHDL, VLDL, LDLCALC, LDLDIRECT    Wt Readings from Last 3 Encounters:  07/13/16 220 lb 1.9 oz (99.8 kg)  02/29/16 230 lb (104.3 kg)  10/12/15 229 lb 9.6 oz (104.1 kg)      Other studies Reviewed: Additional studies/ records that were reviewed today include: Prior echocardiogram from 2014 showing normal left ventricular function and normal valvular function.  Prior rhythm monitoring showing no atrial fibrillation and no significant pauses  From 2014.    ASSESSMENT AND PLAN:  1.  Hypertension: Continue losartan 100 mg daily. Increased amlodipine to 10 mg daily in 2016.  Better controlled.  Obesity-trying to lose weight   2.  Edema: Known lymphedema. Continue to elevate legs.  3.  Hyperlipidemia: In 2016, Stopped simvastatin. Started atorvastatin 40 mg daily due to interaction with amlodipine.  FOllowed by PMD.  4.  SPoke a lot about her husband who passed away a few months ago.  She is trying to adjust to  life without him.  She has friends.  SHe has joined [employer].     Current medicines are reviewed at length with the patient today.  The patient has concerns regarding medicines.  The following changes have been made:  Increased Norvasc, change simvastatin to atorvastatin  Labs/ tests ordered today include:   No orders of the defined types were placed in this encounter.    Disposition:   FU in 4 months  Signed, Larae Grooms, MD  07/13/2016 2:03 PM    [place], [place], [place]  [address] Phone: [phone]; Fax: [phone]

## 2016-07-13 ENCOUNTER — Encounter: Payer: Self-pay | Admitting: Interventional Cardiology

## 2016-07-13 ENCOUNTER — Encounter (INDEPENDENT_AMBULATORY_CARE_PROVIDER_SITE_OTHER): Payer: Self-pay

## 2016-07-13 ENCOUNTER — Ambulatory Visit (INDEPENDENT_AMBULATORY_CARE_PROVIDER_SITE_OTHER): Payer: Medicare Other | Admitting: Interventional Cardiology

## 2016-07-13 VITALS — BP 150/80 | HR 64 | Ht 60.0 in | Wt 220.1 lb

## 2016-07-13 DIAGNOSIS — I10 Essential (primary) hypertension: Secondary | ICD-10-CM | POA: Diagnosis not present

## 2016-07-13 DIAGNOSIS — E785 Hyperlipidemia, unspecified: Secondary | ICD-10-CM

## 2016-07-13 MED ORDER — ATORVASTATIN CALCIUM 40 MG PO TABS: 40.0000 mg | ORAL_TABLET | Freq: Every day | ORAL | 3 refills | Status: DC

## 2016-07-13 NOTE — Addendum Note (Signed)
Addended by: Freada Bergeron on: 07/13/2016 02:26 PM   Modules accepted: Orders

## 2016-07-13 NOTE — Patient Instructions (Signed)
**Note De-identified Alexandra Williams Obfuscation** Medication Instructions:  Same-no changes  Labwork: None  Testing/Procedures: None  Follow-Up: Your physician wants you to follow-up in: 1 year. You will receive a reminder letter in the mail two months in advance. If you don't receive a letter, please call our office to schedule the follow-up appointment.      If you need a refill on your cardiac medications before your next appointment, please call your pharmacy.   

## 2016-08-02 ENCOUNTER — Other Ambulatory Visit: Payer: Self-pay | Admitting: Interventional Cardiology

## 2016-08-09 ENCOUNTER — Ambulatory Visit: Payer: Medicare Other | Admitting: Vascular Surgery

## 2016-08-25 DIAGNOSIS — L501 Idiopathic urticaria: Secondary | ICD-10-CM | POA: Diagnosis not present

## 2016-08-31 ENCOUNTER — Encounter: Payer: Self-pay | Admitting: Vascular Surgery

## 2016-09-05 DIAGNOSIS — Z23 Encounter for immunization: Secondary | ICD-10-CM | POA: Diagnosis not present

## 2016-09-06 ENCOUNTER — Ambulatory Visit (INDEPENDENT_AMBULATORY_CARE_PROVIDER_SITE_OTHER): Payer: Medicare Other | Admitting: Vascular Surgery

## 2016-09-06 ENCOUNTER — Encounter: Payer: Self-pay | Admitting: Vascular Surgery

## 2016-09-06 VITALS — BP 122/70 | HR 65 | Temp 99.8°F | Resp 18 | Ht 60.0 in | Wt 218.8 lb

## 2016-09-06 DIAGNOSIS — I890 Lymphedema, not elsewhere classified: Secondary | ICD-10-CM | POA: Diagnosis not present

## 2016-09-06 NOTE — Progress Notes (Signed)
Patient name: Alexandra Williams MRN: [id number] DOB: 26-Aug-1941 Sex: female  REASON FOR VISIT: Follow up of lymphedema.  HPI: Alexandra Williams is a 75 y.o. female who I'm been following with lymphedema. I last saw her on 08/04/2015. She has had a previous venous duplex scan which did not show any evidence of significant chronic venous insufficiency. Since I saw her last she has been aggressively treating her lymphedema. She was able to get an appointment at the [facility] outpatient rehabilitation center and was getting massage therapy and was taught how to wrap her legs. She is also getting a pneumatic compression device I believe.   She denies any new problems. She denies fever or chills. Her swelling has been stable. She has no history of claudication or rest pain.  Past Medical History:  Diagnosis Date  . Abnormal Pap smear 4/82   colpo/ecc negative  . Arthritis    knee  . Cervical polyp 1/99  . Colon polyps   . Diabetes mellitus without complication (HCC)    pre-diabetes  . Elevated lipids   . Fibrocystic breast   . Hives    undetermined-on 7 antihistamines  . Hyperlipidemia   . Hypertension   . Postmenopausal bleeding 3/99   on HRT/endometrial biopsy-focal simple hyperplasia  . Uterine fibroid   . Vasculitis (HCC)    bilat legs  . Vitamin D deficiency     Family History  Problem Relation Age of Onset  . Pancreatic cancer Mother   . Cancer Mother   . Diabetes Mother   . Hyperlipidemia Mother   . Hypertension Mother   . Heart attack Father   . Ulcers Father   . Heart disease Father   . Kidney disease Father   . Cancer Father   . Hypertension Father   . Mental retardation Daughter   . Cancer Paternal Aunt     mouth    SOCIAL HISTORY: Social History  Substance Use Topics  . Smoking status: Never Smoker  . Smokeless tobacco: Never Used  . Alcohol use No    Allergies  Allergen Reactions  . Codeine Hives  . Peanuts [Peanut Oil] Hives  . Shellfish  Allergy Hives  . Sulfites Hives    preservatives  . Tessalon [Benzonatate] Hives and Itching  . Sulfur Itching    Current Outpatient Prescriptions  Medication Sig Dispense Refill  . acetaminophen (RA ACETAMINOPHEN) 650 MG CR tablet Take 650 mg by mouth as needed for pain.     Marland Kitchen amLODipine (NORVASC) 10 MG tablet take 1 tablet by mouth every evening 90 tablet 3  . aspirin 81 MG tablet Take 81 mg by mouth daily.    Marland Kitchen atorvastatin (LIPITOR) 40 MG tablet Take 1 tablet (40 mg total) by mouth daily. 90 tablet 3  . doxepin (SINEQUAN) 10 MG capsule Take 10 mg by mouth 2 (two) times daily as needed. Two caps daily    . EPIPEN 2-PAK 0.3 MG/0.3ML SOAJ Inject 0.3 mg into the muscle as needed (HIVES).     . hydrOXYzine (ATARAX/VISTARIL) 25 MG tablet Take 10 mg by mouth every 4 (four) hours as needed (HIVES).     Marland Kitchen levocetirizine (XYZAL) 5 MG tablet take 2 tablets by mouth once daily every morning  0  . losartan (COZAAR) 100 MG tablet Take 100 mg by mouth daily.    . metFORMIN (GLUCOPHAGE-XR) 500 MG 24 hr tablet Take 500 mg by mouth daily.  0  . metoprolol succinate (TOPROL-XL) 100 MG 24  hr tablet Take 100 mg by mouth daily.  0  . Omalizumab (XOLAIR [place]) Inject into the skin every 30 (thirty) days.    . predniSONE (DELTASONE) 10 MG tablet Take 10 mg by mouth See admin instructions. Reported on 03/09/2016  0  . ranitidine (ZANTAC) 150 MG tablet Take 150 mg by mouth at bedtime as needed for heartburn.     . triamcinolone ointment (KENALOG) 0.1 % Apply 1 application topically as needed (HIVES).   0  . zolpidem (AMBIEN) 5 MG tablet Take 1 tablet (5 mg total) by mouth at bedtime as needed for sleep. 30 tablet 2  . hydrocortisone (ANUSOL-HC) 2.5 % rectal cream APPLY TO AFFECTED AREA RECTALLY DAILY AS DIRECTED BY YOUR DOCTOR  0   No current facility-administered medications for this visit.     REVIEW OF SYSTEMS:  [X]  denotes positive finding, [ ]  denotes negative finding Cardiac  Comments:  Chest pain or  chest pressure:    Shortness of breath upon exertion:    Short of breath when lying flat:    Irregular heart rhythm:        Vascular    Pain in calf, thigh, or hip brought on by ambulation:    Pain in feet at night that wakes you up from your sleep:     Blood clot in your veins:    Leg swelling:         Pulmonary    Oxygen at home:    Productive cough:     Wheezing:         Neurologic    Sudden weakness in arms or legs:     Sudden numbness in arms or legs:     Sudden onset of difficulty speaking or slurred speech:    Temporary loss of vision in one eye:     Problems with dizziness:         Gastrointestinal    Blood in stool:     Vomited blood:         Genitourinary    Burning when urinating:     Blood in urine:        Psychiatric    Major depression:         Hematologic    Bleeding problems:    Problems with blood clotting too easily:        Skin    Rashes or ulcers:        Constitutional    Fever or chills:      PHYSICAL EXAM: Vitals:   09/06/16 1328  BP: 122/70  Pulse: 65  Resp: 18  Temp: 99.8 F (37.7 C)  TempSrc: Oral  SpO2: 97%  Weight: 218 lb 12.8 oz (99.2 kg)  Height: 5' (1.524 m)    GENERAL: The patient is a well-nourished female, in no acute distress. The vital signs are documented above. CARDIAC: There is a regular rate and rhythm.  VASCULAR: I do not detect carotid bruits. She has palpable dorsalis pedis pulses bilaterally. She has 3+ edema of both lower extremities. PULMONARY: There is good air exchange bilaterally without wheezing or rales. ABDOMEN: Soft and non-tender with normal pitched bowel sounds.  MUSCULOSKELETAL: There are no major deformities or cyanosis. NEUROLOGIC: No focal weakness or paresthesias are detected. SKIN: She has some very mild cellulitis of both legs distally. PSYCHIATRIC: The patient has a normal affect.  MEDICAL ISSUES:  LYMPHEDEMA: This patient has stable bilateral lower extremity lymphedema. She has had  previous venous studies which showed  no evidence of significant chronic insufficiency. She elevates her legs and puts on compression garments as instructed by the wound care center. She also gets massage therapy and is soon to have a pneumatic compression device. We have also discussed water aerobics. She's doing all the right things. I'll plan on seeing her back in 1 year. She knows to call sooner if she has problems.    Deitra Mayo [facility] [phone]

## 2016-09-13 ENCOUNTER — Other Ambulatory Visit: Payer: Self-pay | Admitting: Nurse Practitioner

## 2016-09-13 DIAGNOSIS — L501 Idiopathic urticaria: Secondary | ICD-10-CM | POA: Diagnosis not present

## 2016-09-13 NOTE — Telephone Encounter (Signed)
Medication refill request: Ambien   Last AEX:  10-12-15  Next AEX: 10-13-16 Last MMG (if hormonal medication request): 02-07-16 U/S left breast WNL Refill authorized: please advise

## 2016-09-29 DIAGNOSIS — L501 Idiopathic urticaria: Secondary | ICD-10-CM | POA: Diagnosis not present

## 2016-10-04 DIAGNOSIS — L821 Other seborrheic keratosis: Secondary | ICD-10-CM | POA: Diagnosis not present

## 2016-10-04 DIAGNOSIS — D239 Other benign neoplasm of skin, unspecified: Secondary | ICD-10-CM | POA: Diagnosis not present

## 2016-10-04 DIAGNOSIS — L723 Sebaceous cyst: Secondary | ICD-10-CM | POA: Diagnosis not present

## 2016-10-04 DIAGNOSIS — L509 Urticaria, unspecified: Secondary | ICD-10-CM | POA: Diagnosis not present

## 2016-10-13 ENCOUNTER — Ambulatory Visit (INDEPENDENT_AMBULATORY_CARE_PROVIDER_SITE_OTHER): Payer: Medicare Other | Admitting: Obstetrics & Gynecology

## 2016-10-13 ENCOUNTER — Ambulatory Visit: Payer: Medicare Other | Admitting: Nurse Practitioner

## 2016-10-13 ENCOUNTER — Encounter: Payer: Self-pay | Admitting: Obstetrics & Gynecology

## 2016-10-13 VITALS — BP 126/58 | HR 50 | Resp 14 | Ht 59.0 in | Wt 220.8 lb

## 2016-10-13 DIAGNOSIS — E119 Type 2 diabetes mellitus without complications: Secondary | ICD-10-CM | POA: Diagnosis not present

## 2016-10-13 DIAGNOSIS — Z01419 Encounter for gynecological examination (general) (routine) without abnormal findings: Secondary | ICD-10-CM | POA: Diagnosis not present

## 2016-10-13 MED ORDER — ZOLPIDEM TARTRATE 5 MG PO TABS: 5.0000 mg | ORAL_TABLET | Freq: Every evening | ORAL | 5 refills | Status: DC | PRN

## 2016-10-13 NOTE — Progress Notes (Signed)
75 y.o. G3P0003 MarriedCaucasianF here for annual exam.  Husband passed away in 06-03-23.  She is dealing with this.  Her daughter is living with her.  Is still being seen by PT in the lymphedema clinic.  As also seen Dr. Scot Dock at [facility].    Denies vaginal bleeding.    Working on weight loss with weight watchers.  Unsure of last hba1c.  Would like to see a nutritionist.  Feeling motivated to lose more weight now that she can cook for herself (and not have to cook for her husband.)  PCP:  Milagros Evener, MD.  [facility] [address].  Has appt on Monday.      Patient's last menstrual period was 12/18/2000.          Sexually active: No.  The current method of family planning is post menopausal status.    Exercising: No.  The patient does not participate in regular exercise at present. Smoker:  No  Health Maintenance: Pap:  2013 negative  History of abnormal Pap:  no MMG:  02/07/16 BIRADS 1 negative  Colonoscopy:  2015 polyp- repeat 5 years  BMD:   01/28/16 osteopenia TDaP:  Up to date Pneumonia vaccine(s):  Completed Shingles:  Completed  Hep C testing: not indicated  Screening Labs: PCP, Hb today: PCP, Urine today: PCP   reports that she has never smoked. She has never used smokeless tobacco. She reports that she does not drink alcohol or use drugs.  Past Medical History:  Diagnosis Date  . Abnormal Pap smear 4/82   colpo/ecc negative  . Arthritis    knee  . Cervical polyp 1/99  . Colon polyps   . Diabetes mellitus without complication (HCC)    pre-diabetes  . Elevated lipids   . Fibrocystic breast   . Hives    undetermined-on 7 antihistamines  . Hyperlipidemia   . Hypertension   . Postmenopausal bleeding 3/99   on HRT/endometrial biopsy-focal simple hyperplasia  . Uterine fibroid   . Vasculitis (HCC)    bilat legs  . Vitamin D deficiency     Past Surgical History:  Procedure Laterality Date  . INCISION AND DRAINAGE     Bartholin cyst  . TONSILLECTOMY AND ADENOIDECTOMY      as a child    Current Outpatient Prescriptions  Medication Sig Dispense Refill  . acetaminophen (RA ACETAMINOPHEN) 650 MG CR tablet Take 650 mg by mouth as needed for pain.     Marland Kitchen amLODipine (NORVASC) 10 MG tablet take 1 tablet by mouth every evening 90 tablet 3  . aspirin 81 MG tablet Take 81 mg by mouth daily.    Marland Kitchen atorvastatin (LIPITOR) 40 MG tablet Take 1 tablet (40 mg total) by mouth daily. 90 tablet 3  . doxepin (SINEQUAN) 10 MG capsule Take 10 mg by mouth 2 (two) times daily as needed. Two caps daily    . EPIPEN 2-PAK 0.3 MG/0.3ML SOAJ Inject 0.3 mg into the muscle as needed (HIVES).     . hydrocortisone (ANUSOL-HC) 2.5 % rectal cream APPLY TO AFFECTED AREA RECTALLY DAILY AS DIRECTED BY YOUR DOCTOR  0  . hydrOXYzine (ATARAX/VISTARIL) 25 MG tablet Take 10 mg by mouth every 4 (four) hours as needed (HIVES).     Marland Kitchen levocetirizine (XYZAL) 5 MG tablet take 2 tablets by mouth once daily every morning  0  . losartan (COZAAR) 100 MG tablet Take 100 mg by mouth daily.    . metFORMIN (GLUCOPHAGE-XR) 500 MG 24 hr tablet Take  500 mg by mouth daily.  0  . metoprolol succinate (TOPROL-XL) 100 MG 24 hr tablet Take 100 mg by mouth daily.  0  . Omalizumab (XOLAIR [place]) Inject into the skin every 30 (thirty) days.    [name] DELICA LANCETS [id number] MISC   1  . ONETOUCH VERIO test strip   1  . predniSONE (DELTASONE) 10 MG tablet Take 10 mg by mouth See admin instructions. Reported on 03/09/2016  0  . ranitidine (ZANTAC) 150 MG tablet Take 150 mg by mouth at bedtime as needed for heartburn.     . triamcinolone ointment (KENALOG) 0.1 % Apply 1 application topically as needed (HIVES).   0  . zolpidem (AMBIEN) 5 MG tablet TAKE 1 TABLET BY MOUTH AT BEDTIME IF NEEDED FOR SLEEP 30 tablet 0   No current facility-administered medications for this visit.     Family History  Problem Relation Age of Onset  . Pancreatic cancer Mother   . Cancer Mother   . Diabetes Mother   . Hyperlipidemia Mother   .  Hypertension Mother   . Heart attack Father   . Ulcers Father   . Heart disease Father   . Kidney disease Father   . Cancer Father   . Hypertension Father   . Mental retardation Daughter   . Cancer Paternal Aunt     mouth    ROS:  Pertinent items are noted in HPI.  Otherwise, a comprehensive ROS was negative.  Exam:   BP (!) 126/58 (BP Location: Right Arm, Patient Position: Sitting, Cuff Size: Large)   Pulse (!) 50   Resp 14   Ht 4\' 11"  (1.499 m)   Wt 220 lb 12.8 oz (100.2 kg)   LMP 12/18/2000   BMI 44.60 kg/m   Weight change: -10#  Height: 4\' 11"  (149.9 cm)  Ht Readings from Last 3 Encounters:  10/13/16 4\' 11"  (1.499 m)  09/06/16 5' (1.524 m)  07/13/16 5' (1.524 m)    General appearance: alert, cooperative and appears stated age Head: Normocephalic, without obvious abnormality, atraumatic Neck: no adenopathy, supple, symmetrical, trachea midline and thyroid normal to inspection and palpation Lungs: clear to auscultation bilaterally Breasts: normal appearance, no masses or tenderness Heart: regular rate and rhythm Abdomen: soft, non-tender; bowel sounds normal; no masses,  no organomegaly Extremities: extremities normal, atraumatic, no cyanosis or edema Skin: Skin color, texture, turgor normal. No rashes or lesions Lymph nodes: Cervical, supraclavicular, and axillary nodes normal. No abnormal inguinal nodes palpated Neurologic: Grossly normal  Pelvic: External genitalia:  no lesions              Urethra:  normal appearing urethra with no masses, tenderness or lesions              Bartholins and Skenes: normal                 Vagina: normal appearing vagina with normal color and discharge, no lesions              Cervix: no lesions              Pap taken: No. Bimanual Exam:  Uterus:  normal size, contour, position, consistency, mobility, non-tender              Adnexa: normal adnexa and no mass, fullness, tenderness               Rectovaginal: Confirms  Anus:  normal sphincter tone, no lesions  Chaperone was present for exam.  A:   Well Woman with normal exam PMP, no HRT Hypertension Obesity Elevated lipids Diabetes, type 2, on oral medication Lymphedema, has seen PT this past year H/o Vit D def  P:         Mammogram guidelines reviewed No pap smear obtained.  Not indicated due to age and hx RF for Ambien 5mg  nightly.  #30/5RF.  Pt understands this is likely not covered and not recommended by CMS.  Has used for years and desires to continue. Referal to nutritionist as she is actively working on weight loss.  Down about 10 pounds from last year.  Knows continued weight loss would help lymphedema. return annually or prn

## 2016-10-15 ENCOUNTER — Other Ambulatory Visit: Payer: Self-pay | Admitting: Nurse Practitioner

## 2016-10-16 NOTE — Telephone Encounter (Signed)
Spoke with pharmacy and they do have the RX from last week. There is was an error showing in the system and that's why we received another request

## 2016-10-16 NOTE — Telephone Encounter (Signed)
Medication refill request: Ambien   Last AEX:  10-13-16  Next AEX: 01-31-18 Last MMG (if hormonal medication request): 02-07-16 left breast U/S WNL Refill authorized: please advise

## 2016-10-16 NOTE — Telephone Encounter (Signed)
This was done and faxed last week when she was in the office.  Can you confirm with pharmacy before I do it again?  Thanks.

## 2016-10-18 DIAGNOSIS — M1711 Unilateral primary osteoarthritis, right knee: Secondary | ICD-10-CM | POA: Diagnosis not present

## 2016-10-18 DIAGNOSIS — M170 Bilateral primary osteoarthritis of knee: Secondary | ICD-10-CM | POA: Diagnosis not present

## 2016-10-18 DIAGNOSIS — M1712 Unilateral primary osteoarthritis, left knee: Secondary | ICD-10-CM | POA: Diagnosis not present

## 2016-10-19 DIAGNOSIS — L501 Idiopathic urticaria: Secondary | ICD-10-CM | POA: Diagnosis not present

## 2016-10-23 DIAGNOSIS — I890 Lymphedema, not elsewhere classified: Secondary | ICD-10-CM | POA: Diagnosis not present

## 2016-10-23 DIAGNOSIS — E7800 Pure hypercholesterolemia, unspecified: Secondary | ICD-10-CM | POA: Diagnosis not present

## 2016-10-23 DIAGNOSIS — I10 Essential (primary) hypertension: Secondary | ICD-10-CM | POA: Diagnosis not present

## 2016-10-23 DIAGNOSIS — E119 Type 2 diabetes mellitus without complications: Secondary | ICD-10-CM | POA: Diagnosis not present

## 2016-10-23 DIAGNOSIS — L501 Idiopathic urticaria: Secondary | ICD-10-CM | POA: Diagnosis not present

## 2016-10-23 DIAGNOSIS — Z7984 Long term (current) use of oral hypoglycemic drugs: Secondary | ICD-10-CM | POA: Diagnosis not present

## 2016-10-23 DIAGNOSIS — Z6841 Body Mass Index (BMI) 40.0 and over, adult: Secondary | ICD-10-CM | POA: Diagnosis not present

## 2016-10-25 DIAGNOSIS — M170 Bilateral primary osteoarthritis of knee: Secondary | ICD-10-CM | POA: Diagnosis not present

## 2016-10-26 DIAGNOSIS — L501 Idiopathic urticaria: Secondary | ICD-10-CM | POA: Diagnosis not present

## 2016-10-26 DIAGNOSIS — J3089 Other allergic rhinitis: Secondary | ICD-10-CM | POA: Diagnosis not present

## 2016-10-26 DIAGNOSIS — J301 Allergic rhinitis due to pollen: Secondary | ICD-10-CM | POA: Diagnosis not present

## 2016-10-26 DIAGNOSIS — T783XXA Angioneurotic edema, initial encounter: Secondary | ICD-10-CM | POA: Diagnosis not present

## 2016-10-31 DIAGNOSIS — L509 Urticaria, unspecified: Secondary | ICD-10-CM | POA: Diagnosis not present

## 2016-10-31 DIAGNOSIS — M170 Bilateral primary osteoarthritis of knee: Secondary | ICD-10-CM | POA: Diagnosis not present

## 2016-11-22 DIAGNOSIS — L501 Idiopathic urticaria: Secondary | ICD-10-CM | POA: Diagnosis not present

## 2016-11-28 DIAGNOSIS — M65241 Calcific tendinitis, right hand: Secondary | ICD-10-CM | POA: Diagnosis not present

## 2016-12-07 DIAGNOSIS — L501 Idiopathic urticaria: Secondary | ICD-10-CM | POA: Diagnosis not present

## 2016-12-22 DIAGNOSIS — L501 Idiopathic urticaria: Secondary | ICD-10-CM | POA: Diagnosis not present

## 2016-12-28 DIAGNOSIS — M65241 Calcific tendinitis, right hand: Secondary | ICD-10-CM | POA: Diagnosis not present

## 2017-01-09 DIAGNOSIS — L501 Idiopathic urticaria: Secondary | ICD-10-CM | POA: Diagnosis not present

## 2017-01-11 ENCOUNTER — Other Ambulatory Visit: Payer: Self-pay | Admitting: Obstetrics & Gynecology

## 2017-01-11 ENCOUNTER — Other Ambulatory Visit: Payer: Self-pay | Admitting: Nurse Practitioner

## 2017-01-11 DIAGNOSIS — Z1231 Encounter for screening mammogram for malignant neoplasm of breast: Secondary | ICD-10-CM

## 2017-01-18 DIAGNOSIS — H2513 Age-related nuclear cataract, bilateral: Secondary | ICD-10-CM | POA: Diagnosis not present

## 2017-01-25 DIAGNOSIS — L501 Idiopathic urticaria: Secondary | ICD-10-CM | POA: Diagnosis not present

## 2017-02-05 DIAGNOSIS — H2513 Age-related nuclear cataract, bilateral: Secondary | ICD-10-CM | POA: Diagnosis not present

## 2017-02-05 DIAGNOSIS — E119 Type 2 diabetes mellitus without complications: Secondary | ICD-10-CM | POA: Diagnosis not present

## 2017-02-07 ENCOUNTER — Ambulatory Visit
Admission: RE | Admit: 2017-02-07 | Discharge: 2017-02-07 | Disposition: A | Discharge status: Home / Self Care | Payer: Medicare Other | Source: Ambulatory Visit | Attending: Obstetrics & Gynecology | Admitting: Obstetrics & Gynecology

## 2017-02-07 DIAGNOSIS — Z1231 Encounter for screening mammogram for malignant neoplasm of breast: Secondary | ICD-10-CM | POA: Diagnosis not present

## 2017-02-15 DIAGNOSIS — L501 Idiopathic urticaria: Secondary | ICD-10-CM | POA: Diagnosis not present

## 2017-03-01 DIAGNOSIS — L501 Idiopathic urticaria: Secondary | ICD-10-CM | POA: Diagnosis not present

## 2017-03-13 DIAGNOSIS — L501 Idiopathic urticaria: Secondary | ICD-10-CM | POA: Diagnosis not present

## 2017-04-04 ENCOUNTER — Encounter: Payer: Medicare Other | Attending: Family Medicine | Admitting: *Deleted

## 2017-04-04 DIAGNOSIS — Z6841 Body Mass Index (BMI) 40.0 and over, adult: Secondary | ICD-10-CM | POA: Insufficient documentation

## 2017-04-04 DIAGNOSIS — E119 Type 2 diabetes mellitus without complications: Secondary | ICD-10-CM | POA: Diagnosis not present

## 2017-04-04 DIAGNOSIS — Z713 Dietary counseling and surveillance: Secondary | ICD-10-CM | POA: Diagnosis not present

## 2017-04-04 NOTE — Patient Instructions (Signed)
Plan:  Aim for 2 Carb Choices per meal (30 grams) +/- 1 either way  Aim for 0-1 Carbs per snack if hungry  Include protein in moderation with your meals and snacks Consider reading food labels for Total Carbohydrate of foods Consider  increasing your activity level by doing some Arm Chair Exercises for 5-10 minutes daily and increase as tolerated Continue checking BG at alternate times per day   Continue taking medication as directed by MD

## 2017-04-05 NOTE — Progress Notes (Signed)
Diabetes Self-Management Education  Visit Type: First/Initial  Appt. Start Time: 1030 Appt. End Time: 1200  04/05/2017  Alexandra Williams, identified by name and date of birth, is a 76 y.o. female with a diagnosis of Diabetes: Type 2. Patient states she has had Diabetes for about a year. She is testing her BG 1-2 times a day, reported BG's are within target ranges. She would like to lose more weight, she is currently on Weight Watchers, which she enjoys.  She lost her husband last year and is still adjusting to being alone.  ASSESSMENT  Height 4' 11.5" (1.511 m), weight 219 lb 6.4 oz (99.5 kg), last menstrual period 12/18/2000. Body mass index is 43.57 kg/m.      Diabetes Self-Management Education - 04/04/17 1041      Visit Information   Visit Type First/Initial     Initial Visit   Diabetes Type Type 2   Are you currently following a meal plan? Yes   Are you taking your medications as prescribed? Yes   Date Diagnosed May, 2017     Health Coping   How would you rate your overall health? Good     Psychosocial Assessment   Other persons present Patient   Patient Concerns Nutrition/Meal planning;Glycemic Control   What is the last grade level you completed in school? 2 yrs college     Complications   Last HgB A1C per patient/outside source --  not provided by MD   How often do you check your blood sugar? 1-2 times/day   Have you had a dilated eye exam in the past 12 months? Yes   Have you had a dental exam in the past 12 months? Yes   Are you checking your feet? Yes   How many days per week are you checking your feet? 7     Dietary Intake   Breakfast skips when sleeps late, OR 1 egg, 1 whole grain toast or English muffin with 1 pat butter OR cottage with fruit   Snack (morning) no   Lunch may skip if has late breakfast OR laughing cow cheese with a couple of crackers OR left overs    Dinner salad, vegetables, chicken, small serving of starch   Snack (evening) popcorn OR  dark chocolate chips OR halo high protein ice cream, or fresh fruit   Beverage(s) water, coffee occasionally,      Exercise   Exercise Type ADL's  lymphodema in legs limits exercise, uses cane when outside     Patient Education   Previous Diabetes Education No   Disease state  Definition of diabetes, type 1 and 2, and the diagnosis of diabetes   Nutrition management  Role of diet in the treatment of diabetes and the relationship between the three main macronutrients and blood glucose level;Food label reading, portion sizes and measuring food.;Carbohydrate counting   Physical activity and exercise  Role of exercise on diabetes management, blood pressure control and cardiac health.   Medications Reviewed patients medication for diabetes, action, purpose, timing of dose and side effects.   Monitoring Identified appropriate SMBG and/or A1C goals.   Chronic complications Relationship between chronic complications and blood glucose control   Psychosocial adjustment Role of stress on diabetes     Individualized Goals (developed by patient)   Nutrition Follow meal plan discussed   Physical Activity Exercise 3-5 times per week   Medications take my medication as prescribed   Monitoring  test blood glucose pre and post meals as discussed  Post-Education Assessment   Patient understands the diabetes disease and treatment process. Demonstrates understanding / competency   Patient understands incorporating nutritional management into lifestyle. Demonstrates understanding / competency   Patient undertands incorporating physical activity into lifestyle. Demonstrates understanding / competency   Patient understands using medications safely. Demonstrates understanding / competency   Patient understands monitoring blood glucose, interpreting and using results Demonstrates understanding / competency   Patient understands prevention, detection, and treatment of acute complications. Demonstrates  understanding / competency   Patient understands prevention, detection, and treatment of chronic complications. Demonstrates understanding / competency   Patient understands how to develop strategies to address psychosocial issues. Demonstrates understanding / competency   Patient understands how to develop strategies to promote health/change behavior. Demonstrates understanding / competency     Outcomes   Expected Outcomes Demonstrated interest in learning. Expect positive outcomes   Future DMSE PRN   Program Status Completed      Individualized Plan for Diabetes Self-Management Training:   Learning Objective:  Patient will have a greater understanding of diabetes self-management. Patient education plan is to attend individual and/or group sessions per assessed needs and concerns.   Plan:   Patient Instructions  Plan:  Aim for 2 Carb Choices per meal (30 grams) +/- 1 either way  Aim for 0-1 Carbs per snack if hungry  Include protein in moderation with your meals and snacks Consider reading food labels for Total Carbohydrate of foods Consider  increasing your activity level by doing some Arm Chair Exercises for 5-10 minutes daily and increase as tolerated Continue checking BG at alternate times per day   Continue taking medication as directed by MD  Expected Outcomes:  Demonstrated interest in learning. Expect positive outcomes  Education material provided: Living Well with Diabetes, Meal plan card and Carbohydrate counting sheet  If problems or questions, patient to contact team via:  Phone  Future DSME appointment: PRN

## 2017-04-09 DIAGNOSIS — L501 Idiopathic urticaria: Secondary | ICD-10-CM | POA: Diagnosis not present

## 2017-04-12 ENCOUNTER — Other Ambulatory Visit: Payer: Self-pay | Admitting: Obstetrics & Gynecology

## 2017-04-13 NOTE — Telephone Encounter (Signed)
Rx faxed to [facility]. 

## 2017-04-13 NOTE — Telephone Encounter (Signed)
Medication refill request: Ambien 5mg  Last AEX:  10/13/16 SM Next AEX: 01/31/18 Last MMG (if hormonal medication request): 02/07/17 BIRADS 1 negative/density c Refill authorized: 10/13/16 #30 w/5 refills; today please advise

## 2017-04-19 DIAGNOSIS — L501 Idiopathic urticaria: Secondary | ICD-10-CM | POA: Diagnosis not present

## 2017-05-08 DIAGNOSIS — L501 Idiopathic urticaria: Secondary | ICD-10-CM | POA: Diagnosis not present

## 2017-05-14 DIAGNOSIS — L509 Urticaria, unspecified: Secondary | ICD-10-CM | POA: Diagnosis not present

## 2017-05-17 DIAGNOSIS — E119 Type 2 diabetes mellitus without complications: Secondary | ICD-10-CM | POA: Diagnosis not present

## 2017-05-17 DIAGNOSIS — E7800 Pure hypercholesterolemia, unspecified: Secondary | ICD-10-CM | POA: Diagnosis not present

## 2017-05-17 DIAGNOSIS — I10 Essential (primary) hypertension: Secondary | ICD-10-CM | POA: Diagnosis not present

## 2017-05-17 DIAGNOSIS — Z7984 Long term (current) use of oral hypoglycemic drugs: Secondary | ICD-10-CM | POA: Diagnosis not present

## 2017-05-24 DIAGNOSIS — E7800 Pure hypercholesterolemia, unspecified: Secondary | ICD-10-CM | POA: Diagnosis not present

## 2017-05-24 DIAGNOSIS — Z7984 Long term (current) use of oral hypoglycemic drugs: Secondary | ICD-10-CM | POA: Diagnosis not present

## 2017-05-24 DIAGNOSIS — Z0000 Encounter for general adult medical examination without abnormal findings: Secondary | ICD-10-CM | POA: Diagnosis not present

## 2017-05-24 DIAGNOSIS — I10 Essential (primary) hypertension: Secondary | ICD-10-CM | POA: Diagnosis not present

## 2017-05-24 DIAGNOSIS — E119 Type 2 diabetes mellitus without complications: Secondary | ICD-10-CM | POA: Diagnosis not present

## 2017-05-24 DIAGNOSIS — L501 Idiopathic urticaria: Secondary | ICD-10-CM | POA: Diagnosis not present

## 2017-05-24 DIAGNOSIS — I890 Lymphedema, not elsewhere classified: Secondary | ICD-10-CM | POA: Diagnosis not present

## 2017-05-28 DIAGNOSIS — L501 Idiopathic urticaria: Secondary | ICD-10-CM | POA: Diagnosis not present

## 2017-06-15 DIAGNOSIS — Z6841 Body Mass Index (BMI) 40.0 and over, adult: Secondary | ICD-10-CM | POA: Diagnosis not present

## 2017-06-15 DIAGNOSIS — I890 Lymphedema, not elsewhere classified: Secondary | ICD-10-CM | POA: Diagnosis not present

## 2017-06-15 DIAGNOSIS — L501 Idiopathic urticaria: Secondary | ICD-10-CM | POA: Diagnosis not present

## 2017-06-15 DIAGNOSIS — L509 Urticaria, unspecified: Secondary | ICD-10-CM | POA: Diagnosis not present

## 2017-06-19 DIAGNOSIS — T783XXA Angioneurotic edema, initial encounter: Secondary | ICD-10-CM | POA: Diagnosis not present

## 2017-06-19 DIAGNOSIS — L501 Idiopathic urticaria: Secondary | ICD-10-CM | POA: Diagnosis not present

## 2017-06-19 DIAGNOSIS — J301 Allergic rhinitis due to pollen: Secondary | ICD-10-CM | POA: Diagnosis not present

## 2017-06-19 DIAGNOSIS — J3089 Other allergic rhinitis: Secondary | ICD-10-CM | POA: Diagnosis not present

## 2017-06-29 DIAGNOSIS — M069 Rheumatoid arthritis, unspecified: Secondary | ICD-10-CM | POA: Diagnosis not present

## 2017-06-29 DIAGNOSIS — Z79899 Other long term (current) drug therapy: Secondary | ICD-10-CM | POA: Diagnosis not present

## 2017-07-02 DIAGNOSIS — L501 Idiopathic urticaria: Secondary | ICD-10-CM | POA: Diagnosis not present

## 2017-07-19 DIAGNOSIS — L501 Idiopathic urticaria: Secondary | ICD-10-CM | POA: Diagnosis not present

## 2017-07-31 NOTE — Progress Notes (Signed)
Cardiology Office Note   Date:  08/02/2017   ID:  Alexandra Williams, DOB [birth year], MRN [id number]  PCP:  Aretta Nip, MD    No chief complaint on file.  HTN  Wt Readings from Last 3 Encounters:  08/02/17 219 lb (99.3 kg)  04/04/17 219 lb 6.4 oz (99.5 kg)  10/13/16 220 lb 12.8 oz (100.2 kg)       History of Present Illness: Alexandra Williams is a 76 y.o. female  Who has had difficult to control hypertension. SHe has had hyperlipidemia as well.   Her husband was a ptiatn of mine, but he passed away in May 21, 2016 after developing ESRD.  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  She has had chronic edema in her LE as well, thought to be lymphedema.   SOme days are worse than others.  She uses an inflation device to help with compression. She is wearing the bandages less.  She cannnot tolerate compression stockings.  She has not been checking her BP at home. At other MD visits, they have been below 941 systolic.   Denies : Chest pain. Dizziness. Nitroglycerin use. Orthopnea. Palpitations. Paroxysmal nocturnal dyspnea. Shortness of breath. Syncope.   Walking is limited by knee problems.  She elevates her legs when possible.       Past Medical History:  Diagnosis Date  . Abnormal Pap smear 4/82   colpo/ecc negative  . Arthritis    knee  . Cervical polyp 1/99  . Colon polyps   . Diabetes mellitus without complication (HCC)    pre-diabetes  . Elevated lipids   . Fibrocystic breast   . Hives    undetermined-on 7 antihistamines  . Hyperlipidemia   . Hypertension   . Postmenopausal bleeding 3/99   on HRT/endometrial biopsy-focal simple hyperplasia  . Uterine fibroid   . Vasculitis (HCC)    bilat legs  . Vitamin D deficiency     Past Surgical History:  Procedure Laterality Date  . INCISION AND DRAINAGE     Bartholin cyst  . TONSILLECTOMY AND ADENOIDECTOMY     as a child     Current Outpatient Prescriptions  Medication  Sig Dispense Refill  . acetaminophen (RA ACETAMINOPHEN) 650 MG CR tablet Take 650 mg by mouth as needed for pain.     Marland Kitchen amLODipine (NORVASC) 10 MG tablet take 1 tablet by mouth every evening 90 tablet 3  . aspirin 81 MG tablet Take 81 mg by mouth daily.    Marland Kitchen atorvastatin (LIPITOR) 40 MG tablet Take 1 tablet (40 mg total) by mouth daily. 90 tablet 3  . doxepin (SINEQUAN) 10 MG capsule Take 10 mg by mouth 2 (two) times daily as needed. Two caps daily    . EPIPEN 2-PAK 0.3 MG/0.3ML SOAJ Inject 0.3 mg into the muscle as needed (HIVES).     . hydrocortisone (ANUSOL-HC) 2.5 % rectal cream APPLY TO AFFECTED AREA RECTALLY DAILY AS DIRECTED BY YOUR DOCTOR  0  . hydroxychloroquine (PLAQUENIL) 200 MG tablet Take 200 mg by mouth 2 (two) times daily.    . hydrOXYzine (ATARAX/VISTARIL) 25 MG tablet Take 10 mg by mouth every 4 (four) hours as needed (HIVES).     Marland Kitchen losartan (COZAAR) 100 MG tablet Take 100 mg by mouth daily.    . metFORMIN (GLUCOPHAGE-XR) 500 MG 24 hr tablet Take 500 mg by mouth daily.  0  . metoprolol tartrate (LOPRESSOR) 50 MG tablet Take  50 mg by mouth daily.    . Omalizumab (XOLAIR [place]) Inject into the skin every 30 (thirty) days.    [name] DELICA LANCETS 31S MISC   1  . ONETOUCH VERIO test strip   1  . predniSONE (DELTASONE) 10 MG tablet Take 10 mg by mouth as needed. Reported on 03/09/2016  0  . ranitidine (ZANTAC) 150 MG tablet Take 150 mg by mouth at bedtime as needed for heartburn.     . triamcinolone ointment (KENALOG) 0.1 % Apply 1 application topically as needed (HIVES).   0  . zolpidem (AMBIEN) 5 MG tablet take 1 tablet by mouth at bedtime if needed for sleep 30 tablet 5   No current facility-administered medications for this visit.     Allergies:   Codeine; Peanuts [peanut oil]; Shellfish allergy; Sulfites; Tessalon [benzonatate]; and Sulfur    Social History:  The patient  reports that she has never smoked. She has never used smokeless tobacco. She reports that she does  not drink alcohol or use drugs.   Family History:  The patient's family history includes Cancer in her father, mother, and paternal aunt; Diabetes in her mother; Heart attack in her father; Heart disease in her father; Hyperlipidemia in her mother; Hypertension in her father and mother; Kidney disease in her father; Mental retardation in her daughter; Pancreatic cancer in her mother; Ulcers in her father.    ROS:  Please see the history of present illness.   Otherwise, review of systems are positive for chronic leg swelling.   All other systems are reviewed and negative.    PHYSICAL EXAM: VS:  BP 140/68 (BP Location: Left Arm, Patient Position: Sitting, Cuff Size: Large)   Pulse (!) 56   Ht 4' 11.5" (1.511 m)   Wt 219 lb (99.3 kg)   LMP 12/18/2000   SpO2 92%   BMI 43.49 kg/m  , BMI Body mass index is 43.49 kg/m. GEN: Well nourished, well developed, in no acute distress  HEENT: normal  Neck: no JVD, carotid bruits, or masses Cardiac: RRR; 2/6 murmur - systolic, no rubs, or gallops,; bilateral pitting edema  Respiratory:  clear to auscultation bilaterally, normal work of breathing GI: soft, nontender, nondistended, + BS MS: no deformity or atrophy  Skin: warm and dry, no rash Neuro:  Strength and sensation are intact Psych: euthymic mood, full affect   EKG:   The ekg ordered today demonstrates sinus bradycardia, no ST segment changes   Recent Labs: No results found for requested labs within last 8760 hours.   Lipid Panel No results found for: CHOL, TRIG, HDL, CHOLHDL, VLDL, LDLCALC, LDLDIRECT   Other studies Reviewed: Additional studies/ records that were reviewed today with results demonstrating: 2014 echo as noted above.   ASSESSMENT AND PLAN:  1. HTN: Blood pressure well controlled. Continue current medications. 2. Edema: Lymphedema.  Elevate legs. COntinue to try to lose weight.  Continue to use the inflation compression device that she has at  home. 3. Hyperlipidemia: Lipids from May 2018 reviewed. LDL 86. Triglycerides 85. HDL 48. Total cholesterol 151. Well controlled. Continue current lipid-lowering therapy. 4. She is continuing to adjust to now being a widow. She also has stress that she has a daughter who lives with her who has special needs. 5. Heart murmur: Last echo was from 2014 which did not show any significant valvular disease. We'll recheck echocardiogram to evaluate given systolic murmur. 6. DM: Controlled. Followed by PMD.     Current medicines are  reviewed at length with the patient today.  The patient concerns regarding her medicines were addressed.  The following changes have been made:  No change  Labs/ tests ordered today include:  No orders of the defined types were placed in this encounter.   Recommend 150 minutes/week of aerobic exercise Low fat, low carb, high fiber diet recommended  Disposition:   FU in fro echo and then 1 year   Signed, Larae Grooms, MD  08/02/2017 9:30 AM    [address], [place], [place]  [address] Phone: [phone]; Fax: [phone]

## 2017-08-02 ENCOUNTER — Ambulatory Visit (INDEPENDENT_AMBULATORY_CARE_PROVIDER_SITE_OTHER): Payer: Medicare Other | Admitting: Interventional Cardiology

## 2017-08-02 ENCOUNTER — Encounter: Payer: Self-pay | Admitting: Interventional Cardiology

## 2017-08-02 VITALS — BP 140/68 | HR 56 | Ht 59.5 in | Wt 219.0 lb

## 2017-08-02 DIAGNOSIS — I890 Lymphedema, not elsewhere classified: Secondary | ICD-10-CM | POA: Diagnosis not present

## 2017-08-02 DIAGNOSIS — I10 Essential (primary) hypertension: Secondary | ICD-10-CM

## 2017-08-02 DIAGNOSIS — R011 Cardiac murmur, unspecified: Secondary | ICD-10-CM

## 2017-08-02 DIAGNOSIS — E119 Type 2 diabetes mellitus without complications: Secondary | ICD-10-CM | POA: Diagnosis not present

## 2017-08-02 DIAGNOSIS — E782 Mixed hyperlipidemia: Secondary | ICD-10-CM | POA: Diagnosis not present

## 2017-08-02 NOTE — Patient Instructions (Signed)
Medication Instructions:  Your physician recommends that you continue on your current medications as directed. Please refer to the Current Medication list given to you today.   Labwork: None ordered  Testing/Procedures: Your physician has requested that you have an echocardiogram. Echocardiography is a painless test that uses sound waves to create images of your heart. It provides your doctor with information about the size and shape of your heart and how well your heart's chambers and valves are working. This procedure takes approximately one hour. There are no restrictions for this procedure.  Follow-Up: Your physician wants you to follow-up in: 1 year with Dr. Varanasi. You will receive a reminder letter in the mail two months in advance. If you don't receive a letter, please call our office to schedule the follow-up appointment.   Any Other Special Instructions Will Be Listed Below (If Applicable).     If you need a refill on your cardiac medications before your next appointment, please call your pharmacy.   

## 2017-08-13 ENCOUNTER — Other Ambulatory Visit: Payer: Self-pay

## 2017-08-13 ENCOUNTER — Other Ambulatory Visit: Payer: Self-pay | Admitting: Interventional Cardiology

## 2017-08-13 ENCOUNTER — Ambulatory Visit (HOSPITAL_COMMUNITY): Payer: Medicare Other | Attending: Cardiology

## 2017-08-13 DIAGNOSIS — I340 Nonrheumatic mitral (valve) insufficiency: Secondary | ICD-10-CM | POA: Insufficient documentation

## 2017-08-13 DIAGNOSIS — E785 Hyperlipidemia, unspecified: Secondary | ICD-10-CM | POA: Insufficient documentation

## 2017-08-13 DIAGNOSIS — L501 Idiopathic urticaria: Secondary | ICD-10-CM | POA: Diagnosis not present

## 2017-08-13 DIAGNOSIS — E119 Type 2 diabetes mellitus without complications: Secondary | ICD-10-CM | POA: Diagnosis not present

## 2017-08-13 DIAGNOSIS — R011 Cardiac murmur, unspecified: Secondary | ICD-10-CM | POA: Diagnosis not present

## 2017-08-13 DIAGNOSIS — I10 Essential (primary) hypertension: Secondary | ICD-10-CM | POA: Diagnosis not present

## 2017-08-13 DIAGNOSIS — Z8249 Family history of ischemic heart disease and other diseases of the circulatory system: Secondary | ICD-10-CM | POA: Insufficient documentation

## 2017-08-15 ENCOUNTER — Other Ambulatory Visit: Payer: Self-pay | Admitting: Interventional Cardiology

## 2017-08-16 DIAGNOSIS — Z23 Encounter for immunization: Secondary | ICD-10-CM | POA: Diagnosis not present

## 2017-08-24 DIAGNOSIS — H0012 Chalazion right lower eyelid: Secondary | ICD-10-CM | POA: Diagnosis not present

## 2017-09-03 DIAGNOSIS — L501 Idiopathic urticaria: Secondary | ICD-10-CM | POA: Diagnosis not present

## 2017-09-12 ENCOUNTER — Ambulatory Visit: Payer: Medicare Other | Admitting: Vascular Surgery

## 2017-09-18 DIAGNOSIS — Z6841 Body Mass Index (BMI) 40.0 and over, adult: Secondary | ICD-10-CM | POA: Diagnosis not present

## 2017-09-18 DIAGNOSIS — T783XXD Angioneurotic edema, subsequent encounter: Secondary | ICD-10-CM | POA: Diagnosis not present

## 2017-09-18 DIAGNOSIS — L501 Idiopathic urticaria: Secondary | ICD-10-CM | POA: Diagnosis not present

## 2017-09-19 DIAGNOSIS — L501 Idiopathic urticaria: Secondary | ICD-10-CM | POA: Diagnosis not present

## 2017-10-03 DIAGNOSIS — L501 Idiopathic urticaria: Secondary | ICD-10-CM | POA: Diagnosis not present

## 2017-10-15 ENCOUNTER — Other Ambulatory Visit: Payer: Self-pay | Admitting: Obstetrics & Gynecology

## 2017-10-15 DIAGNOSIS — R159 Full incontinence of feces: Secondary | ICD-10-CM | POA: Diagnosis not present

## 2017-10-15 NOTE — Telephone Encounter (Signed)
Medication refill request: Ambien Last AEX:  10-13-16  Next AEX: 10-22-18  Last MMG (if hormonal medication request): 02-07-17 WNL  Refill authorized: please advise

## 2017-10-16 NOTE — Telephone Encounter (Signed)
Prescription for Alexandra Williams, 5RF faxed to [facility] on [address]. Fax #: [phone].

## 2017-10-17 DIAGNOSIS — L501 Idiopathic urticaria: Secondary | ICD-10-CM | POA: Diagnosis not present

## 2017-10-31 DIAGNOSIS — L501 Idiopathic urticaria: Secondary | ICD-10-CM | POA: Diagnosis not present

## 2017-11-06 DIAGNOSIS — R159 Full incontinence of feces: Secondary | ICD-10-CM | POA: Diagnosis not present

## 2017-11-13 DIAGNOSIS — M25531 Pain in right wrist: Secondary | ICD-10-CM | POA: Diagnosis not present

## 2017-11-13 DIAGNOSIS — M65241 Calcific tendinitis, right hand: Secondary | ICD-10-CM | POA: Diagnosis not present

## 2017-11-14 ENCOUNTER — Ambulatory Visit (INDEPENDENT_AMBULATORY_CARE_PROVIDER_SITE_OTHER): Payer: Medicare Other | Admitting: Vascular Surgery

## 2017-11-14 ENCOUNTER — Encounter: Payer: Self-pay | Admitting: Vascular Surgery

## 2017-11-14 VITALS — BP 184/85 | HR 62 | Temp 98.0°F | Resp 20 | Ht 59.5 in | Wt 223.0 lb

## 2017-11-14 DIAGNOSIS — I890 Lymphedema, not elsewhere classified: Secondary | ICD-10-CM

## 2017-11-14 NOTE — Progress Notes (Signed)
Patient name: Alexandra Williams MRN: [id number] DOB: 23-May-1941 Sex: female  REASON FOR VISIT:   Follow-up of lymphedema.  HPI:   Alexandra Williams is a pleasant 75 y.o. female who I last saw on 09/06/2016.  She has a history of lymphedema.  Previous Doppler studies have not shown any evidence of significant chronic venous insufficiency.  She had been getting massage therapy and continued with her compression therapy and leg elevation when I saw her last.  She was also arranging to get a pneumatic compression device.  He comes in for a one-year follow-up visit.  Since I saw her last, she did get 3 months of massage therapy but then they did not have room to continue this.  She does have a pneumatic compression device but does not use this every day.  She has a recliner to elevate her legs.  She does use compression bandages and stockings do not fit properly.  She has no history of DVT.  She has no history of abdominal surgery or radiation therapy.   There is no significant pain associated with her leg swelling.  She thinks that her leg swelling has been stable over the last year.  Past Medical History:  Diagnosis Date  . Abnormal Pap smear 4/82   colpo/ecc negative  . Arthritis    knee  . Cervical polyp 1/99  . Colon polyps   . Diabetes mellitus without complication (HCC)    pre-diabetes  . Elevated lipids   . Fibrocystic breast   . Hives    undetermined-on 7 antihistamines  . Hyperlipidemia   . Hypertension   . Postmenopausal bleeding 3/99   on HRT/endometrial biopsy-focal simple hyperplasia  . Uterine fibroid   . Vasculitis (HCC)    bilat legs  . Vitamin D deficiency     Family History  Problem Relation Age of Onset  . Pancreatic cancer Mother   . Cancer Mother   . Diabetes Mother   . Hyperlipidemia Mother   . Hypertension Mother   . Heart attack Father   . Ulcers Father   . Heart disease Father   . Kidney disease Father   . Cancer Father   . Hypertension Father     . Mental retardation Daughter   . Cancer Paternal Aunt        mouth    SOCIAL HISTORY: Social History   Tobacco Use  . Smoking status: Never Smoker  . Smokeless tobacco: Never Used  Substance Use Topics  . Alcohol use: No    Alcohol/week: 0.0 oz    Allergies  Allergen Reactions  . Codeine Hives  . Peanuts [Peanut Oil] Hives  . Shellfish Allergy Hives  . Sulfites Hives    preservatives preservatives  . Tessalon [Benzonatate] Hives and Itching  . Sulfur Itching    Current Outpatient Medications  Medication Sig Dispense Refill  . acetaminophen (RA ACETAMINOPHEN) 650 MG CR tablet Take 650 mg by mouth as needed for pain.     Marland Kitchen amLODipine (NORVASC) 10 MG tablet take 1 tablet by mouth every evening 90 tablet 3  . aspirin 81 MG tablet Take 81 mg by mouth daily.    Marland Kitchen atorvastatin (LIPITOR) 40 MG tablet Take 1 tablet (40 mg total) by mouth daily. 90 tablet 3  . doxepin (SINEQUAN) 10 MG capsule Take 10 mg by mouth 2 (two) times daily as needed. Two caps daily    . EPIPEN 2-PAK 0.3 MG/0.3ML SOAJ Inject 0.3 mg into the muscle as  needed (HIVES).     . hydrocortisone (ANUSOL-HC) 2.5 % rectal cream APPLY TO AFFECTED AREA RECTALLY DAILY AS DIRECTED BY YOUR DOCTOR  0  . hydroxychloroquine (PLAQUENIL) 200 MG tablet Take 200 mg by mouth 2 (two) times daily.    Marland Kitchen losartan (COZAAR) 100 MG tablet Take 100 mg by mouth daily.    . metFORMIN (GLUCOPHAGE-XR) 500 MG 24 hr tablet Take 500 mg by mouth daily.  0  . metoprolol tartrate (LOPRESSOR) 50 MG tablet Take 50 mg by mouth daily.    . Omalizumab (XOLAIR [place]) Inject into the skin every 30 (thirty) days.    [name] DELICA LANCETS 36R MISC   1  . ONETOUCH VERIO test strip   1  . predniSONE (DELTASONE) 10 MG tablet Take 10 mg by mouth as needed. Reported on 03/09/2016  0  . ranitidine (ZANTAC) 150 MG tablet Take 150 mg by mouth at bedtime as needed for heartburn.     . triamcinolone ointment (KENALOG) 0.1 % Apply 1 application topically as needed  (HIVES).   0  . zolpidem (AMBIEN) 5 MG tablet take 1 tablet by mouth at bedtime if needed for sleep 30 tablet 5  . hydrOXYzine (ATARAX/VISTARIL) 25 MG tablet Take 10 mg by mouth every 4 (four) hours as needed (HIVES).      No current facility-administered medications for this visit.     REVIEW OF SYSTEMS:  [X]  denotes positive finding, [ ]  denotes negative finding Cardiac  Comments:  Chest pain or chest pressure:    Shortness of breath upon exertion:    Short of breath when lying flat:    Irregular heart rhythm:        Vascular    Pain in calf, thigh, or hip brought on by ambulation:    Pain in feet at night that wakes you up from your sleep:     Blood clot in your veins:    Leg swelling:  X       Pulmonary    Oxygen at home:    Productive cough:     Wheezing:         Neurologic    Sudden weakness in arms or legs:     Sudden numbness in arms or legs:     Sudden onset of difficulty speaking or slurred speech:    Temporary loss of vision in one eye:     Problems with dizziness:         Gastrointestinal    Blood in stool:     Vomited blood:         Genitourinary    Burning when urinating:     Blood in urine:        Psychiatric    Major depression:         Hematologic    Bleeding problems:    Problems with blood clotting too easily:        Skin    Rashes or ulcers:        Constitutional    Fever or chills:     PHYSICAL EXAM:   Vitals:   11/14/17 1043  BP: (!) 184/85  Pulse: 62  Resp: 20  Temp: 98 F (36.7 C)  TempSrc: Oral  SpO2: 98%  Weight: 223 lb (101.2 kg)  Height: 4' 11.5" (1.511 m)    GENERAL: The patient is a well-nourished female, in no acute distress. The vital signs are documented above. CARDIAC: There is a regular rate and rhythm.  VASCULAR: I do not detect carotid bruits. He has palpable dorsalis pedis pulses bilaterally. She has significant bilateral lower extremity swelling with sparing of her feet. PULMONARY: There is good air  exchange bilaterally without wheezing or rales. ABDOMEN: Soft and non-tender with normal pitched bowel sounds.  MUSCULOSKELETAL: There are no major deformities or cyanosis. NEUROLOGIC: No focal weakness or paresthesias are detected. SKIN: There are no ulcers or rashes noted. PSYCHIATRIC: The patient has a normal affect.  DATA:    No new studies  MEDICAL ISSUES:   PRIMARY LYMPHEDEMA: Her lymphedema remains stable.  She will continue to elevate her legs on her recliner.  She will continue to use her compression bandages.  She also has a pneumatic compression device which I have encouraged her to use daily.  In addition I written her prescription prep to try to get more massage therapy as this was also very helpful.  HYPERTENSION: The patient's initial blood pressure today was elevated. We repeated this and this was still elevated. We have encouraged the patient to follow up with their primary care physician for management of their blood pressure.   Deitra Mayo [facility] [phone]

## 2017-11-15 DIAGNOSIS — L501 Idiopathic urticaria: Secondary | ICD-10-CM | POA: Diagnosis not present

## 2017-11-29 ENCOUNTER — Other Ambulatory Visit: Payer: Self-pay

## 2017-11-29 ENCOUNTER — Ambulatory Visit (INDEPENDENT_AMBULATORY_CARE_PROVIDER_SITE_OTHER): Payer: Medicare Other | Admitting: Obstetrics & Gynecology

## 2017-11-29 ENCOUNTER — Encounter: Payer: Self-pay | Admitting: Obstetrics & Gynecology

## 2017-11-29 VITALS — BP 122/68 | HR 64 | Resp 14 | Ht 59.0 in | Wt 223.0 lb

## 2017-11-29 DIAGNOSIS — Z01419 Encounter for gynecological examination (general) (routine) without abnormal findings: Secondary | ICD-10-CM

## 2017-11-29 DIAGNOSIS — Z124 Encounter for screening for malignant neoplasm of cervix: Secondary | ICD-10-CM

## 2017-11-29 MED ORDER — ZOLPIDEM TARTRATE 5 MG PO TABS: 5.0000 mg | ORAL_TABLET | Freq: Every day | ORAL | 5 refills | Status: DC

## 2017-11-29 NOTE — Progress Notes (Signed)
76 y.o. G3P0003 WidowedCaucasianF here for annual exam.  Having issues with walking due to joint issues and obesity.  Denies vaginal bleeding.    Daughter is still living with her.  Having some difficulty with deciding where to life in the future and where to have her daughter.    Having chronic urinary incontinence.    Patient's last menstrual period was 12/18/2000.          Sexually active: No.  The current method of family planning is post menopausal status.    Exercising: No.  The patient does not participate in regular exercise at present. Smoker:  no  Health Maintenance: Pap:  2013 Negative History of abnormal Pap: yes MMG:  02/07/17 BIRADS1:Neg  Colonoscopy:  2015 Polyp. F/u 5 years  BMD:   01/28/16 Osteopenia  TDaP:  UTD  Pneumonia vaccine(s):  Done  Zostavax & Shingrix:   Done Hep C testing: not indicated Screening Labs: PCP, Hb today: PCP, Urine today: not collected   reports that  has never smoked. she has never used smokeless tobacco. She reports that she does not drink alcohol or use drugs.  Past Medical History:  Diagnosis Date  . Abnormal Pap smear 4/82   colpo/ecc negative  . Arthritis    knee  . Cervical polyp 1/99  . Colon polyps   . Diabetes mellitus without complication (HCC)    pre-diabetes  . Elevated lipids   . Fibrocystic breast   . Hives    undetermined-on 7 antihistamines  . Hyperlipidemia   . Hypertension   . Postmenopausal bleeding 3/99   on HRT/endometrial biopsy-focal simple hyperplasia  . Uterine fibroid   . Vasculitis (HCC)    bilat legs  . Vitamin D deficiency     Past Surgical History:  Procedure Laterality Date  . INCISION AND DRAINAGE     Bartholin cyst  . TONSILLECTOMY AND ADENOIDECTOMY     as a child    Current Outpatient Medications  Medication Sig Dispense Refill  . acetaminophen (RA ACETAMINOPHEN) 650 MG CR tablet Take 650 mg by mouth as needed for pain.     Marland Kitchen amLODipine (NORVASC) 10 MG tablet take 1 tablet by mouth  every evening 90 tablet 3  . aspirin 81 MG tablet Take 81 mg by mouth daily.    Marland Kitchen atorvastatin (LIPITOR) 40 MG tablet Take 1 tablet (40 mg total) by mouth daily. 90 tablet 3  . doxepin (SINEQUAN) 10 MG capsule Take 10 mg by mouth daily as needed. Two caps daily    . EPIPEN 2-PAK 0.3 MG/0.3ML SOAJ Inject 0.3 mg into the muscle as needed (HIVES).     . hydrocortisone (ANUSOL-HC) 2.5 % rectal cream APPLY TO AFFECTED AREA RECTALLY DAILY AS DIRECTED BY YOUR DOCTOR  0  . hydroxychloroquine (PLAQUENIL) 200 MG tablet Take 200 mg by mouth 2 (two) times daily.    Marland Kitchen losartan (COZAAR) 100 MG tablet Take 100 mg by mouth daily.    . metFORMIN (GLUCOPHAGE-XR) 500 MG 24 hr tablet Take 500 mg by mouth daily.  0  . metoprolol tartrate (LOPRESSOR) 50 MG tablet Take 50 mg by mouth daily.    Marland Kitchen neomycin-polymyxin b-dexamethasone (MAXITROL) 3.5-10000-0.1 SUSP instill 1 drop into affected eye four times a day  0  . Omalizumab (XOLAIR [place]) Inject into the skin every 14 (fourteen) days.     [name] DELICA LANCETS 54T MISC   1  . ONETOUCH VERIO test strip   1  . predniSONE (DELTASONE) 10 MG  tablet Take 10 mg by mouth as needed. Reported on 03/09/2016  0  . ranitidine (ZANTAC) 150 MG tablet Take 150 mg by mouth at bedtime as needed for heartburn.     . zolpidem (AMBIEN) 5 MG tablet take 1 tablet by mouth at bedtime if needed for sleep 30 tablet 5   No current facility-administered medications for this visit.     Family History  Problem Relation Age of Onset  . Pancreatic cancer Mother   . Cancer Mother   . Diabetes Mother   . Hyperlipidemia Mother   . Hypertension Mother   . Heart attack Father   . Ulcers Father   . Heart disease Father   . Kidney disease Father   . Cancer Father   . Hypertension Father   . Mental retardation Daughter   . Cancer Paternal Aunt        mouth    ROS:  Pertinent items are noted in HPI.  Otherwise, a comprehensive ROS was negative.  Exam:   BP 122/68 (BP Location: Left Arm,  Patient Position: Sitting, Cuff Size: Large)   Pulse 64   Resp 14   Ht 4\' 11"  (1.499 m)   Wt 223 lb (101.2 kg)   LMP 12/18/2000   BMI 45.04 kg/m   Weight change: +3#   Height: 4\' 11"  (149.9 cm)  Ht Readings from Last 3 Encounters:  11/29/17 4\' 11"  (1.499 m)  11/14/17 4' 11.5" (1.511 m)  08/02/17 4' 11.5" (1.511 m)    General appearance: alert, cooperative and appears stated age Head: Normocephalic, without obvious abnormality, atraumatic Neck: no adenopathy, supple, symmetrical, trachea midline and thyroid normal to inspection and palpation Lungs: clear to auscultation bilaterally Breasts: normal appearance, no masses or tenderness Heart: regular rate and rhythm Abdomen: soft, non-tender; bowel sounds normal; no masses,  no organomegaly Extremities: extremities normal, atraumatic, no cyanosis or edema Skin: Skin color, texture, turgor normal. No rashes or lesions Lymph nodes: Cervical, supraclavicular, and axillary nodes normal. No abnormal inguinal nodes palpated Neurologic: Grossly normal   Pelvic: External genitalia:  no lesions              Urethra:  normal appearing urethra with no masses, tenderness or lesions              Bartholins and Skenes: normal                 Vagina: normal appearing vagina with normal color and discharge, no lesions              Cervix: no lesions              Pap taken: No. Bimanual Exam:  Uterus:  normal size, contour, position, consistency, mobility, non-tender              Adnexa: no mass, fullness, tenderness               Rectovaginal: Confirms               Anus:  normal sphincter tone, no lesions  Chaperone was present for exam.  A:  Well Woman with normal exam PMP, no HRT Morbid obesity Hypertension Chronic lymphedema Elevated lipids Type 2 diabetes Insomnia   P:   Mammogram guidelines reviewed pap smear not indicated. Rx for Ambien 5mg  nightly #30/5RF Lab work and vaccines UTD pt with Dr. Zadie Rhine. return annually or  prn

## 2017-12-03 DIAGNOSIS — L501 Idiopathic urticaria: Secondary | ICD-10-CM | POA: Diagnosis not present

## 2017-12-19 ENCOUNTER — Ambulatory Visit: Payer: Medicare Other | Admitting: Obstetrics & Gynecology

## 2017-12-19 DIAGNOSIS — L501 Idiopathic urticaria: Secondary | ICD-10-CM | POA: Diagnosis not present

## 2017-12-26 ENCOUNTER — Encounter: Payer: Self-pay | Admitting: Physical Therapy

## 2017-12-26 ENCOUNTER — Ambulatory Visit: Payer: Medicare Other | Attending: Vascular Surgery | Admitting: Physical Therapy

## 2017-12-26 DIAGNOSIS — I890 Lymphedema, not elsewhere classified: Secondary | ICD-10-CM | POA: Diagnosis not present

## 2017-12-26 DIAGNOSIS — R262 Difficulty in walking, not elsewhere classified: Secondary | ICD-10-CM | POA: Insufficient documentation

## 2017-12-26 NOTE — Therapy (Signed)
[place], [place], [address] Phone: [phone]   Fax:  [phone]  Physical Therapy Evaluation  Patient Details  Name: Alexandra Williams MRN: [id number] Date of Birth: 1941-02-12 Referring Provider: Deitra Mayo   Encounter Date: 12/26/2017  PT End of Session - 12/26/17 1716    Visit Number  1    Number of Visits  9    Date for PT Re-Evaluation  01/23/18    PT Start Time  1517    PT Stop Time  1608    PT Time Calculation (min)  51 min    Activity Tolerance  Patient tolerated treatment well    Behavior During Therapy  [facility] for tasks assessed/performed       Past Medical History:  Diagnosis Date  . Abnormal Pap smear 4/82   colpo/ecc negative  . Arthritis    knee  . Cervical polyp 1/99  . Colon polyps   . Diabetes mellitus without complication (HCC)    pre-diabetes  . Elevated lipids   . Fibrocystic breast   . Hives    undetermined-on 7 antihistamines  . Hyperlipidemia   . Hypertension   . Postmenopausal bleeding 3/99   on HRT/endometrial biopsy-focal simple hyperplasia  . Uterine fibroid   . Vasculitis (HCC)    bilat legs  . Vitamin D deficiency     Past Surgical History:  Procedure Laterality Date  . INCISION AND DRAINAGE     Bartholin cyst  . TONSILLECTOMY AND ADENOIDECTOMY     as a child    There were no vitals filed for this visit.   Subjective Assessment - 12/26/17 1527    Subjective  My left leg i smy bad leg. I find that around my ankle concerns me because it seems to be the most delicate. I can never get my ankles down. I can barely get my legs in to slacks. I use my compression bandages but I don't use them everyday. I also use the compression pump. I can not wear the compression garments because I get idiopathic hives and get welts where there is pressure. I have the velcro garment and I can wear that all day. I can not wear the top all day because it gets  annyoing and heavy and I get tired from walking around with it. I have a band that goes around my ankle and foot and that does not work well. It gets uncomfortable and I do not think it fits the way it is supposed to.     Pertinent History  Pt reports she began having swelling over 50 years ago when she was pregnant. She had her first cellulitis infection at that time. Her leg swelling would come and go until the past three to four years it has not gotten any better. Pt was treated for a cellulitis infection in October of 2016. Pt reports she does not have any heart or kidney problems, no family history of lymphedema, pt is in need of knee replacements and reports her knees buckle occasionally and that is why she uses a cane    Patient Stated Goals  to see if we can get the swelling down so I don't get tired being on my feet    Currently in Pain?  No/denies         [facility] PT Assessment - 12/26/17 0001      Assessment   Medical Diagnosis  bilateral LE lymphedema    Referring Provider  Deitra Mayo    Onset Date/Surgical Date  03/09/12    Hand Dominance  Right    Prior Therapy  none      Precautions   Precautions  None      Restrictions   Weight Bearing Restrictions  No      Home Environment   Living Environment  Private residence    Living Arrangements  Children daughter has autism     Available Help at Discharge  Family;Friend(s)    Type of [place] to enter    Entrance Stairs-Number of Steps  6    [place] reach both;Left;Right    Home Layout  Two level;Bed/bath upstairs has Psychologist, occupational for husband    Alternate Level Stairs-Number of Steps  16    Alternate Level Stairs-Rails  Can reach both    [employer] - single point;Walker - 2 wheels;Bedside commode;Shower seat;Toilet riser;Grab bars - tub/shower;Grab bars - toilet;Hand held shower head;Wheelchair - Banker      Prior Function   Level of  Independence  Independent with household mobility with device;Independent with homemaking with ambulation;Independent with community mobility with device;Independent with transfers    Vocation  Retired    Leisure  pt walks 15-20 min several times a week      Cognition   Overall Cognitive Status  Within Functional Limits for tasks assessed      Observation/Other Assessments   Other Surveys   Other Surveys LLIS - 46% impairment      Strength   Right Hip Flexion  --    Right Hip External Rotation   --    Right Hip Internal Rotation  --    Right Hip ABduction  --    Right Hip ADduction  --    Left Hip Flexion  --    Left Hip External Rotation  --    Left Hip Internal Rotation  --    Left Hip ABduction  --    Left Hip ADduction  --    Right Knee Flexion  --    Right Knee Extension  --    Left Knee Flexion  --    Left Knee Extension  --    Right Ankle Dorsiflexion  --    Left Ankle Dorsiflexion  --        LYMPHEDEMA/ONCOLOGY QUESTIONNAIRE - 12/26/17 1548      Right Lower Extremity Lymphedema   30 cm Proximal to Suprapatella  76.5 cm    20 cm Proximal to Suprapatella  68 cm    10 cm Proximal to Suprapatella  63.8 cm    At Midpatella/Popliteal Crease  54.3 cm    30 cm Proximal to Floor at Lateral Plantar Foot  55 cm    20 cm Proximal to Floor at Lateral Plantar Foot  50.5 1    10  cm Proximal to Floor at Lateral Malleoli  37 cm    5 cm Proximal to 1st MTP Joint  22.5 cm    Across MTP Joint  22.8 cm    Around Proximal Great Toe  7.6 cm      Left Lower Extremity Lymphedema   30 cm Proximal to Suprapatella  73.7 cm    20 cm Proximal to Suprapatella  69 cm    10 cm Proximal to Suprapatella  64.5 cm    At Midpatella/Popliteal Crease  58 cm    30 cm Proximal to Floor  at Lateral Plantar Foot  57 cm    20 cm Proximal to Floor at Lateral Plantar Foot  50.5 cm    10 cm Proximal to Floor at Lateral Malleoli  38.5 cm    5 cm Proximal to 1st MTP Joint  21.8 cm    Across MTP Joint  22  cm    Around Proximal Great Toe  7.6 cm          Objective measurements completed on examination: See above findings.              PT Long Term Goals - 12/26/17 1709      PT LONG TERM GOAL #1   Title  Pt will be able to independently manage her lymphedema through use of compression bandages and appropriate compression garments.    Time  4    Period  Weeks    Status  New    Target Date  01/23/18      PT LONG TERM GOAL #2   Title  Pt will demonstrate 5 cm reduction of swelling 30 cm proximal to floor on lateral plantar surface of LLE to all improved ambulation    Baseline  57    Time  4    Period  Weeks    Status  New    Target Date  01/23/18      PT LONG TERM GOAL #3   Title  Pt will report increased ease with donning socks and pants due to reduction of swelling in LLE    Time  4    Period  Weeks    Status  New    Target Date  01/23/18          Plan - 12/26/17 1704    Clinical Impression Statement  Pt presents to PT with an exacerbation of swelling in her LLE. She is known previously to this clinic where she received lymphedema services in 2017. Pt has been managing her bilateral LE swelling with compression banadges and a compression pump though she reports she does not use them daily. She is unable to wear compression garments other than her velcro garments for management because she develops idiopathic hives in areas of compression. She has been using the lower leg part of her velcro garment but not the thigh piece because she states it makes her leg too heavy to ambulate. She would like to decrease the swelling especially at her anke where she has the most difficulty so that she can walk more easily with less fatigue. Pt would benefit from skilled PT services to decrease bilateral LE lymphedema to allow improved mobility. Will focus on L to begin with since this is worse than R.     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 (her husband passed away 05-22-16)    PT Frequency  3x / week    PT Duration  4 weeks    PT Treatment/Interventions  ADLs/Self Care Home Management;Therapeutic exercise;Manual techniques;Manual lymph drainage;Compression bandaging;Passive range of motion;Taping;Vasopneumatic Device    PT Next Visit Plan  begin MLD to LLE and bandaging to LLE from foot to knee with abdominal binder at thigh    Consulted and Agree with Plan of Care  Patient       Patient will benefit from skilled therapeutic  intervention in order to improve the following deficits and impairments:  Increased edema, Decreased mobility  Visit Diagnosis: Lymphedema, not elsewhere classified - Plan: PT plan of care cert/re-cert  Difficulty in walking, not elsewhere classified - Plan: PT plan of care cert/re-cert     Problem List Patient Active Problem List   Diagnosis Date Noted  . Heart murmur 08/02/2017  . Diabetes type 2, controlled ([place]) 10/13/2016  . Morbid obesity ([place]) 10/08/2014  . Vasculitis ([place]) 10/08/2014  . Essential hypertension 10/08/2014  . Lymphedema 02/18/2014  . Mixed hyperlipidemia 05/14/2008    Allyson Sabal [facility] 12/26/2017, 5:17 PM  [place], [place], [address] Phone: [phone]   Fax:  [phone]  Name: Alexandra Williams MRN: [id number] Date of Birth: [birth year]  Manus Gunning, PT 12/26/17 5:17 PM

## 2017-12-28 ENCOUNTER — Ambulatory Visit: Payer: Medicare Other | Admitting: Physical Therapy

## 2017-12-28 DIAGNOSIS — I890 Lymphedema, not elsewhere classified: Secondary | ICD-10-CM

## 2017-12-28 DIAGNOSIS — R262 Difficulty in walking, not elsewhere classified: Secondary | ICD-10-CM

## 2017-12-28 NOTE — Therapy (Signed)
[place], [place], [address] Phone: [phone]   Fax:  [phone]  Physical Therapy Treatment  Patient Details  Name: Alexandra Williams MRN: [id number] Date of Birth: [birth year] Referring Provider: Deitra Mayo   Encounter Date: 12/28/2017  PT End of Session - 12/28/17 1209    Visit Number  2    Number of Visits  9    Date for PT Re-Evaluation  01/23/18    PT Start Time  1102    PT Stop Time  1155    PT Time Calculation (min)  53 min    Activity Tolerance  Patient tolerated treatment well    Behavior During Therapy  [facility] for tasks assessed/performed       Past Medical History:  Diagnosis Date  . Abnormal Pap smear 4/82   colpo/ecc negative  . Arthritis    knee  . Cervical polyp 1/99  . Colon polyps   . Diabetes mellitus without complication (HCC)    pre-diabetes  . Elevated lipids   . Fibrocystic breast   . Hives    undetermined-on 7 antihistamines  . Hyperlipidemia   . Hypertension   . Postmenopausal bleeding 3/99   on HRT/endometrial biopsy-focal simple hyperplasia  . Uterine fibroid   . Vasculitis (HCC)    bilat legs  . Vitamin D deficiency     Past Surgical History:  Procedure Laterality Date  . INCISION AND DRAINAGE     Bartholin cyst  . TONSILLECTOMY AND ADENOIDECTOMY     as a child    There were no vitals filed for this visit.  Subjective Assessment - 12/28/17 1106    Subjective  My leg seems to be okay. There is no change in it.     Pertinent History  Pt reports she began having swelling over 50 years ago when she was pregnant. She had her first cellulitis infection at that time. Her leg swelling would come and go until the past three to four years it has not gotten any better. Pt was treated for a cellulitis infection in October of 2016. Pt reports she does not have any heart or kidney problems, no family history of lymphedema, pt is in need of knee replacements  and reports her knees buckle occasionally and that is why she uses a cane    Patient Stated Goals  to see if we can get the swelling down so I don't get tired being on my feet    Currently in Pain?  No/denies                      [facility] Adult PT Treatment/Exercise - 12/28/17 0001      Manual Therapy   Manual Therapy  Manual Lymphatic Drainage (MLD);Compression Bandaging    Manual Lymphatic Drainage (MLD)  short neck, superficial and deep abdominals, left axillary nodes and establishment of inguino axillary pathway, LLE working proximal to distal then retracing all steps    Compression Bandaging  lotion applied from foot to knee, thick stockinette from foot to groin, artiflex from foot to knee, 1 6cm, 1 8cm, 1 10cm and 1 12 cm from foot to knee, donned abdominal binder to left thigh                  PT Long Term Goals - 12/26/17 1709      PT LONG TERM GOAL #1   Title  Pt will be able to independently manage her  lymphedema through use of compression bandages and appropriate compression garments.    Time  4    Period  Weeks    Status  New    Target Date  01/23/18      PT LONG TERM GOAL #2   Title  Pt will demonstrate 5 cm reduction of swelling 30 cm proximal to floor on lateral plantar surface of LLE to all improved ambulation    Baseline  57    Time  4    Period  Weeks    Status  New    Target Date  01/23/18      PT LONG TERM GOAL #3   Title  Pt will report increased ease with donning socks and pants due to reduction of swelling in LLE    Time  4    Period  Weeks    Status  New    Target Date  01/23/18            Plan - 12/28/17 1209    Clinical Impression Statement  Began MLD to LLE and compression bandaging. Next session may add compression bandages over knee to help decrease lobule in this area.     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  (her husband passed away 05-18-2016)    PT Frequency  3x / week    PT Duration  4 weeks    PT Treatment/Interventions  ADLs/Self Care Home Management;Therapeutic exercise;Manual techniques;Manual lymph drainage;Compression bandaging;Passive range of motion;Taping;Vasopneumatic Device    PT Next Visit Plan  continue MLD to LLE and bandaging to LLE from foot to knee with abdominal binder at thigh, possibly add bandaging over the knee    Consulted and Agree with Plan of Care  Patient       Patient will benefit from skilled therapeutic intervention in order to improve the following deficits and impairments:  Increased edema, Decreased mobility  Visit Diagnosis: Lymphedema, not elsewhere classified  Difficulty in walking, not elsewhere classified     Problem List Patient Active Problem List   Diagnosis Date Noted  . Heart murmur 08/02/2017  . Diabetes type 2, controlled ([place]) 10/13/2016  . Morbid obesity ([place]) 10/08/2014  . Vasculitis ([place]) 10/08/2014  . Essential hypertension 10/08/2014  . Lymphedema 02/18/2014  . Mixed hyperlipidemia 05/14/2008    Allyson Sabal [facility] 12/28/2017, 12:14 PM  [place], [place], [address] Phone: [phone]   Fax:  [phone]  Name: CHYREL TAHA MRN: [id number] Date of Birth: 02/28/1941  Manus Gunning, PT 12/28/17 12:14 PM

## 2017-12-31 ENCOUNTER — Encounter: Payer: Self-pay | Admitting: Physical Therapy

## 2017-12-31 ENCOUNTER — Ambulatory Visit: Payer: Medicare Other | Admitting: Physical Therapy

## 2017-12-31 DIAGNOSIS — R262 Difficulty in walking, not elsewhere classified: Secondary | ICD-10-CM

## 2017-12-31 DIAGNOSIS — I890 Lymphedema, not elsewhere classified: Secondary | ICD-10-CM | POA: Diagnosis not present

## 2017-12-31 NOTE — Therapy (Signed)
[place], [place], [address] Phone: [phone]   Fax:  [phone]  Physical Therapy Treatment  Patient Details  Name: Alexandra Williams MRN: [id number] Date of Birth: [birth year] Referring Provider: Deitra Mayo   Encounter Date: 12/31/2017  PT End of Session - 12/31/17 1102    Visit Number  3    Number of Visits  9    Date for PT Re-Evaluation  01/23/18    PT Start Time  1030    PT Stop Time  1058    PT Time Calculation (min)  28 min    Activity Tolerance  Patient tolerated treatment well    Behavior During Therapy  [facility] for tasks assessed/performed       Past Medical History:  Diagnosis Date  . Abnormal Pap smear 4/82   colpo/ecc negative  . Arthritis    knee  . Cervical polyp 1/99  . Colon polyps   . Diabetes mellitus without complication (HCC)    pre-diabetes  . Elevated lipids   . Fibrocystic breast   . Hives    undetermined-on 7 antihistamines  . Hyperlipidemia   . Hypertension   . Postmenopausal bleeding 3/99   on HRT/endometrial biopsy-focal simple hyperplasia  . Uterine fibroid   . Vasculitis (HCC)    bilat legs  . Vitamin D deficiency     Past Surgical History:  Procedure Laterality Date  . INCISION AND DRAINAGE     Bartholin cyst  . TONSILLECTOMY AND ADENOIDECTOMY     as a child    There were no vitals filed for this visit.  Subjective Assessment - 12/31/17 1033    Subjective  I left the bandages on until this morning when I showered. It has gone down some.    Pertinent History  Pt reports she began having swelling over 50 years ago when she was pregnant. She had her first cellulitis infection at that time. Her leg swelling would come and go until the past three to four years it has not gotten any better. Pt was treated for a cellulitis infection in October of 2016. Pt reports she does not have any heart or kidney problems, no family history of lymphedema, pt  is in need of knee replacements and reports her knees buckle occasionally and that is why she uses a cane    Patient Stated Goals  to see if we can get the swelling down so I don't get tired being on my feet    Currently in Pain?  No/denies            LYMPHEDEMA/ONCOLOGY QUESTIONNAIRE - 12/31/17 1035      Left Lower Extremity Lymphedema   20 cm Proximal to Suprapatella  68.5 cm    10 cm Proximal to Suprapatella  65 cm    At Midpatella/Popliteal Crease  58.5 cm    30 cm Proximal to Floor at Lateral Plantar Foot  50.4 cm    20 cm Proximal to Floor at Lateral Plantar Foot  44.7 cm    10 cm Proximal to Floor at Lateral Malleoli  33.4 cm    5 cm Proximal to 1st MTP Joint  21.4 cm    Across MTP Joint  21.2 cm    Around Proximal Great Toe  7.6 cm               [facility] Adult PT Treatment/Exercise - 12/31/17 0001      Manual Therapy   Manual Therapy  Compression Bandaging    Manual therapy comments  Circumferential mesurements taken on Lt LE    Compression Bandaging  thick stockinette from foot to groin, artiflex from foot to knee, 1 6cm, 1 8cm, 1 10cm and 1 12 cm from foot to knee, 1 - 12 cm donned at knee then donned abdominal binder to left thigh                  PT Long Term Goals - 12/26/17 1709      PT LONG TERM GOAL #1   Title  Pt will be able to independently manage her lymphedema through use of compression bandages and appropriate compression garments.    Time  4    Period  Weeks    Status  New    Target Date  01/23/18      PT LONG TERM GOAL #2   Title  Pt will demonstrate 5 cm reduction of swelling 30 cm proximal to floor on lateral plantar surface of LLE to all improved ambulation    Baseline  57    Time  4    Period  Weeks    Status  New    Target Date  01/23/18      PT LONG TERM GOAL #3   Title  Pt will report increased ease with donning socks and pants due to reduction of swelling in LLE    Time  4    Period  Weeks    Status  New    Target  Date  01/23/18            Plan - 12/31/17 1102    Clinical Impression Statement  Pt demonstrates excellent reduction of LLE edema as evidenced by decreased circumference of LLE compared with eval. Pt was able to leave bandages intact until just before this appointment. Applied an additional 12 cm bandage at pt's knee today to help decrease knee swelling.     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 (her husband passed away 2016-06-01)    PT Frequency  3x / week    PT Duration  4 weeks    PT Treatment/Interventions  ADLs/Self Care Home Management;Therapeutic exercise;Manual techniques;Manual lymph drainage;Compression bandaging;Passive range of motion;Taping;Vasopneumatic Device    PT Next Visit Plan  continue MLD to LLE and bandaging to LLE from foot to knee with abdominal binder at thigh, possibly add bandaging over the knee    Consulted and Agree with Plan of Care  Patient       Patient will benefit from skilled therapeutic intervention in order to improve the following deficits and impairments:  Increased edema, Decreased mobility  Visit Diagnosis: Lymphedema, not elsewhere classified  Difficulty in walking, not elsewhere classified     Problem List Patient Active Problem List   Diagnosis Date Noted  . Heart murmur 08/02/2017  . Diabetes type 2, controlled ([place]) 10/13/2016  . Morbid obesity ([place]) 10/08/2014  . Vasculitis ([place]) 10/08/2014  . Essential hypertension 10/08/2014  . Lymphedema 02/18/2014  . Mixed hyperlipidemia 05/14/2008    Allyson Sabal [facility] 12/31/2017, 11:06 AM  [place], [place], [address] Phone: [phone]   Fax:  [phone]  Name: TEHILLAH CIPRIANI MRN: [id number] Date of Birth: 1941-03-18  Manus Gunning, PT 12/31/17 11:07 AM

## 2018-01-02 ENCOUNTER — Ambulatory Visit: Payer: Medicare Other | Admitting: Physical Therapy

## 2018-01-02 ENCOUNTER — Encounter: Payer: Self-pay | Admitting: Physical Therapy

## 2018-01-02 DIAGNOSIS — R262 Difficulty in walking, not elsewhere classified: Secondary | ICD-10-CM

## 2018-01-02 DIAGNOSIS — I890 Lymphedema, not elsewhere classified: Secondary | ICD-10-CM | POA: Diagnosis not present

## 2018-01-02 NOTE — Therapy (Signed)
[place], [place], [address] Phone: [phone]   Fax:  [phone]  Physical Therapy Treatment  Patient Details  Name: Alexandra Williams MRN: [id number] Date of Birth: 05/06/1941 Referring Provider: Deitra Mayo   Encounter Date: 01/02/2018  PT End of Session - 01/02/18 1207    Visit Number  4    Number of Visits  9    Date for PT Re-Evaluation  01/23/18    PT Start Time  1108    PT Stop Time  1155    PT Time Calculation (min)  47 min    Activity Tolerance  Patient tolerated treatment well    Behavior During Therapy  [facility] for tasks assessed/performed       Past Medical History:  Diagnosis Date  . Abnormal Pap smear 4/82   colpo/ecc negative  . Arthritis    knee  . Cervical polyp 1/99  . Colon polyps   . Diabetes mellitus without complication (HCC)    pre-diabetes  . Elevated lipids   . Fibrocystic breast   . Hives    undetermined-on 7 antihistamines  . Hyperlipidemia   . Hypertension   . Postmenopausal bleeding 3/99   on HRT/endometrial biopsy-focal simple hyperplasia  . Uterine fibroid   . Vasculitis (HCC)    bilat legs  . Vitamin D deficiency     Past Surgical History:  Procedure Laterality Date  . INCISION AND DRAINAGE     Bartholin cyst  . TONSILLECTOMY AND ADENOIDECTOMY     as a child    There were no vitals filed for this visit.  Subjective Assessment - 01/02/18 1110    Subjective  The bandages did well. I left everything on but the upper ones I took off yesterday and I rewrapped them. I kept the rest on until this morning.     Pertinent History  Pt reports she began having swelling over 50 years ago when she was pregnant. She had her first cellulitis infection at that time. Her leg swelling would come and go until the past three to four years it has not gotten any better. Pt was treated for a cellulitis infection in October of 2016. Pt reports she does not have any  heart or kidney problems, no family history of lymphedema, pt is in need of knee replacements and reports her knees buckle occasionally and that is why she uses a cane    Patient Stated Goals  to see if we can get the swelling down so I don't get tired being on my feet    Currently in Pain?  No/denies                      [facility] Adult PT Treatment/Exercise - 01/02/18 0001      Manual Therapy   Manual Therapy  Compression Bandaging    Manual Lymphatic Drainage (MLD)  short neck, 5 diaphragmatic breaths, left axillary nodes and establishment of inguino axillary pathway, LLE working proximal to distal then retracing all steps    Compression Bandaging  thick stockinette from foot to groin, artiflex from foot to knee, 1 6cm, 1 8cm, 1 10cm and 1 12 cm from foot to knee, 1 - 12 cm donned at knee then donned abdominal binder to left thigh                  PT Long Term Goals - 12/26/17 1709      PT LONG TERM  GOAL #1   Title  Pt will be able to independently manage her lymphedema through use of compression bandages and appropriate compression garments.    Time  4    Period  Weeks    Status  New    Target Date  01/23/18      PT LONG TERM GOAL #2   Title  Pt will demonstrate 5 cm reduction of swelling 30 cm proximal to floor on lateral plantar surface of LLE to all improved ambulation    Baseline  57    Time  4    Period  Weeks    Status  New    Target Date  01/23/18      PT LONG TERM GOAL #3   Title  Pt will report increased ease with donning socks and pants due to reduction of swelling in LLE    Time  4    Period  Weeks    Status  New    Target Date  01/23/18            Plan - 01/02/18 1207    Clinical Impression Statement  Pt demonstrates great reduction especially at her left ankle as evidenced by increased skin mobility and less of a lobule. Continued with bandaging and MLD. Once pt reaches maximal reduction will try having pt wear her velcro  compression garment and see if this manages her swelling.     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 (her husband passed away Jun 03, 2016)    PT Frequency  3x / week    PT Duration  4 weeks    PT Treatment/Interventions  ADLs/Self Care Home Management;Therapeutic exercise;Manual techniques;Manual lymph drainage;Compression bandaging;Passive range of motion;Taping;Vasopneumatic Device    PT Next Visit Plan  continue MLD to LLE and bandaging to LLE from foot to knee with abdominal binder at thigh, possibly add bandaging over the knee    Consulted and Agree with Plan of Care  Patient       Patient will benefit from skilled therapeutic intervention in order to improve the following deficits and impairments:  Increased edema, Decreased mobility  Visit Diagnosis: Lymphedema, not elsewhere classified  Difficulty in walking, not elsewhere classified     Problem List Patient Active Problem List   Diagnosis Date Noted  . Heart murmur 08/02/2017  . Diabetes type 2, controlled ([place]) 10/13/2016  . Morbid obesity ([place]) 10/08/2014  . Vasculitis ([place]) 10/08/2014  . Essential hypertension 10/08/2014  . Lymphedema 02/18/2014  . Mixed hyperlipidemia 05/14/2008    Allyson Sabal [facility] 01/02/2018, 12:09 PM  [place], [place], [address] Phone: [phone]   Fax:  [phone]  Name: Alexandra Williams MRN: [id number] Date of Birth: 1941/02/09  Manus Gunning, PT 01/02/18 12:10 PM

## 2018-01-03 DIAGNOSIS — L501 Idiopathic urticaria: Secondary | ICD-10-CM | POA: Diagnosis not present

## 2018-01-04 ENCOUNTER — Encounter: Payer: Medicare Other | Admitting: Physical Therapy

## 2018-01-07 ENCOUNTER — Encounter: Payer: Self-pay | Admitting: Physical Therapy

## 2018-01-07 ENCOUNTER — Ambulatory Visit: Payer: Medicare Other | Admitting: Physical Therapy

## 2018-01-07 DIAGNOSIS — R262 Difficulty in walking, not elsewhere classified: Secondary | ICD-10-CM | POA: Diagnosis not present

## 2018-01-07 DIAGNOSIS — I890 Lymphedema, not elsewhere classified: Secondary | ICD-10-CM

## 2018-01-07 NOTE — Therapy (Signed)
[place], [place], [address] Phone: [phone]   Fax:  [phone]  Physical Therapy Treatment  Patient Details  Name: Alexandra Williams MRN: [id number] Date of Birth: [birth year] Referring Provider: Deitra Mayo   Encounter Date: 01/07/2018  PT End of Session - 01/07/18 1204    Visit Number  5    Number of Visits  9    Date for PT Re-Evaluation  01/23/18    PT Start Time  1020    PT Stop Time  1140    PT Time Calculation (min)  80 min    Activity Tolerance  Patient tolerated treatment well    Behavior During Therapy  [facility] for tasks assessed/performed       Past Medical History:  Diagnosis Date  . Abnormal Pap smear 4/82   colpo/ecc negative  . Arthritis    knee  . Cervical polyp 1/99  . Colon polyps   . Diabetes mellitus without complication (HCC)    pre-diabetes  . Elevated lipids   . Fibrocystic breast   . Hives    undetermined-on 7 antihistamines  . Hyperlipidemia   . Hypertension   . Postmenopausal bleeding 3/99   on HRT/endometrial biopsy-focal simple hyperplasia  . Uterine fibroid   . Vasculitis (HCC)    bilat legs  . Vitamin D deficiency     Past Surgical History:  Procedure Laterality Date  . INCISION AND DRAINAGE     Bartholin cyst  . TONSILLECTOMY AND ADENOIDECTOMY     as a child    There were no vitals filed for this visit.  Subjective Assessment - 01/07/18 1022    Subjective  I took the bandages off on Saturday. I missed my Friday appointment because I thought the appointmnet was at 48.     Pertinent History  Pt reports she began having swelling over 50 years ago when she was pregnant. She had her first cellulitis infection at that time. Her leg swelling would come and go until the past three to four years it has not gotten any better. Pt was treated for a cellulitis infection in October of 2016. Pt reports she does not have any heart or kidney problems, no  family history of lymphedema, pt is in need of knee replacements and reports her knees buckle occasionally and that is why she uses a cane    Patient Stated Goals  to see if we can get the swelling down so I don't get tired being on my feet    Currently in Pain?  No/denies                      [facility] Adult PT Treatment/Exercise - 01/07/18 0001      Manual Therapy   Manual Therapy  Compression Bandaging    Manual Lymphatic Drainage (MLD)  short neck, superficial and deep abdominals, left axillary nodes and establishment of inguino axillary pathway, LLE working proximal to distal then retracing all steps    Compression Bandaging  lotion applied then thick stockinette from foot to groin, artiflex from foot to knee, 1 6cm, 1 8cm, 1 10cm and 1 12 cm from foot to knee, 1 - 12 cm donned at knee then donned abdominal binder to left thigh                  PT Long Term Goals - 12/26/17 1709      PT LONG TERM GOAL #1  Title  Pt will be able to independently manage her lymphedema through use of compression bandages and appropriate compression garments.    Time  4    Period  Weeks    Status  New    Target Date  01/23/18      PT LONG TERM GOAL #2   Title  Pt will demonstrate 5 cm reduction of swelling 30 cm proximal to floor on lateral plantar surface of LLE to all improved ambulation    Baseline  57    Time  4    Period  Weeks    Status  New    Target Date  01/23/18      PT LONG TERM GOAL #3   Title  Pt will report increased ease with donning socks and pants due to reduction of swelling in LLE    Time  4    Period  Weeks    Status  New    Target Date  01/23/18            Plan - 01/07/18 1205    Clinical Impression Statement  Pt missed her Friday appointment and had her bandages off since Saturday so her legs demonstrated increased swelling today. Performed MLD and proceeded with bandaging to LLE. Will measure at next session.     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 (her husband passed away 2016-05-06)    PT Frequency  3x / week    PT Duration  4 weeks    PT Treatment/Interventions  ADLs/Self Care Home Management;Therapeutic exercise;Manual techniques;Manual lymph drainage;Compression bandaging;Passive range of motion;Taping;Vasopneumatic Device    PT Next Visit Plan  measure and assess goals, continue MLD to LLE and bandaging to LLE from foot to knee with abdominal binder at thigh, possibly add bandaging over the knee    Consulted and Agree with Plan of Care  Patient       Patient will benefit from skilled therapeutic intervention in order to improve the following deficits and impairments:  Increased edema, Decreased mobility  Visit Diagnosis: Lymphedema, not elsewhere classified  Difficulty in walking, not elsewhere classified     Problem List Patient Active Problem List   Diagnosis Date Noted  . Heart murmur 08/02/2017  . Diabetes type 2, controlled ([place]) 10/13/2016  . Morbid obesity ([place]) 10/08/2014  . Vasculitis ([place]) 10/08/2014  . Essential hypertension 10/08/2014  . Lymphedema 02/18/2014  . Mixed hyperlipidemia 05/14/2008    Allyson Sabal [facility] 01/07/2018, 12:07 PM  [place], [place], [address] Phone: [phone]   Fax:  [phone]  Name: ZAVIA PULLEN MRN: [id number] Date of Birth: 08-18-1941  Manus Gunning, PT 01/07/18 12:07 PM

## 2018-01-08 ENCOUNTER — Other Ambulatory Visit: Payer: Self-pay | Admitting: Obstetrics & Gynecology

## 2018-01-08 DIAGNOSIS — Z1231 Encounter for screening mammogram for malignant neoplasm of breast: Secondary | ICD-10-CM

## 2018-01-08 DIAGNOSIS — E119 Type 2 diabetes mellitus without complications: Secondary | ICD-10-CM | POA: Diagnosis not present

## 2018-01-08 DIAGNOSIS — H2513 Age-related nuclear cataract, bilateral: Secondary | ICD-10-CM | POA: Diagnosis not present

## 2018-01-09 ENCOUNTER — Encounter: Payer: Self-pay | Admitting: Physical Therapy

## 2018-01-09 ENCOUNTER — Ambulatory Visit: Payer: Medicare Other | Admitting: Physical Therapy

## 2018-01-09 DIAGNOSIS — I890 Lymphedema, not elsewhere classified: Secondary | ICD-10-CM

## 2018-01-09 DIAGNOSIS — R262 Difficulty in walking, not elsewhere classified: Secondary | ICD-10-CM

## 2018-01-09 NOTE — Therapy (Signed)
[place], [place], [address] Phone: [phone]   Fax:  [phone]  Physical Therapy Treatment  Patient Details  Name: Alexandra Williams MRN: [id number] Date of Birth: 1941-11-05 Referring Provider: Deitra Mayo   Encounter Date: 01/09/2018  PT End of Session - 01/09/18 1207    Visit Number  6    Number of Visits  9    Date for PT Re-Evaluation  01/23/18    PT Start Time  1108    PT Stop Time  1155    PT Time Calculation (min)  47 min    Activity Tolerance  Patient tolerated treatment well    Behavior During Therapy  [facility] for tasks assessed/performed       Past Medical History:  Diagnosis Date  . Abnormal Pap smear 4/82   colpo/ecc negative  . Arthritis    knee  . Cervical polyp 1/99  . Colon polyps   . Diabetes mellitus without complication (HCC)    pre-diabetes  . Elevated lipids   . Fibrocystic breast   . Hives    undetermined-on 7 antihistamines  . Hyperlipidemia   . Hypertension   . Postmenopausal bleeding 3/99   on HRT/endometrial biopsy-focal simple hyperplasia  . Uterine fibroid   . Vasculitis (HCC)    bilat legs  . Vitamin D deficiency     Past Surgical History:  Procedure Laterality Date  . INCISION AND DRAINAGE     Bartholin cyst  . TONSILLECTOMY AND ADENOIDECTOMY     as a child    There were no vitals filed for this visit.  Subjective Assessment - 01/09/18 1108    Subjective  I took my bandages off this morning before I got in to the shower.     Pertinent History  Pt reports she began having swelling over 50 years ago when she was pregnant. She had her first cellulitis infection at that time. Her leg swelling would come and go until the past three to four years it has not gotten any better. Pt was treated for a cellulitis infection in October of 2016. Pt reports she does not have any heart or kidney problems, no family history of lymphedema, pt is in need of  knee replacements and reports her knees buckle occasionally and that is why she uses a cane    Patient Stated Goals  to see if we can get the swelling down so I don't get tired being on my feet    Currently in Pain?  No/denies                      [facility] Adult PT Treatment/Exercise - 01/09/18 0001      Manual Therapy   Manual Therapy  Compression Bandaging;Manual Lymphatic Drainage (MLD)    Manual Lymphatic Drainage (MLD)  short neck, superficial and deep abdominals, left axillary nodes and establishment of inguino axillary pathway, LLE working proximal to distal then retracing all steps    Compression Bandaging  lotion applied then thick stockinette from foot to groin, artiflex from foot to knee, 1 6cm, 1 8cm, 1 10cm and 1 12 cm from foot to knee, 1 - 12 cm donned at knee then donned abdominal binder to left thigh                  PT Long Term Goals - 12/26/17 1709      PT LONG TERM GOAL #1   Title  Pt  will be able to independently manage her lymphedema through use of compression bandages and appropriate compression garments.    Time  4    Period  Weeks    Status  New    Target Date  01/23/18      PT LONG TERM GOAL #2   Title  Pt will demonstrate 5 cm reduction of swelling 30 cm proximal to floor on lateral plantar surface of LLE to all improved ambulation    Baseline  57    Time  4    Period  Weeks    Status  New    Target Date  01/23/18      PT LONG TERM GOAL #3   Title  Pt will report increased ease with donning socks and pants due to reduction of swelling in LLE    Time  4    Period  Weeks    Status  New    Target Date  01/23/18            Plan - 01/09/18 1207    Clinical Impression Statement  Continued MLD and will continue that and bandaging until pt reaches maximal reduction of edema. Will measure at Friday's session. Did not have enough time today.     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 (her husband passed away 31-May-2016)    PT Frequency  3x / week    PT Duration  4 weeks    PT Treatment/Interventions  ADLs/Self Care Home Management;Therapeutic exercise;Manual techniques;Manual lymph drainage;Compression bandaging;Passive range of motion;Taping;Vasopneumatic Device    PT Next Visit Plan  measure and assess goals, continue MLD to LLE and bandaging to LLE from foot to knee with abdominal binder at thigh, possibly add bandaging over the knee    Consulted and Agree with Plan of Care  Patient       Patient will benefit from skilled therapeutic intervention in order to improve the following deficits and impairments:  Increased edema, Decreased mobility  Visit Diagnosis: Lymphedema, not elsewhere classified  Difficulty in walking, not elsewhere classified     Problem List Patient Active Problem List   Diagnosis Date Noted  . Heart murmur 08/02/2017  . Diabetes type 2, controlled ([place]) 10/13/2016  . Morbid obesity ([place]) 10/08/2014  . Vasculitis ([place]) 10/08/2014  . Essential hypertension 10/08/2014  . Lymphedema 02/18/2014  . Mixed hyperlipidemia 05/14/2008    Allyson Sabal [facility] 01/09/2018, 12:08 PM  [place], [place], [address] Phone: [phone]   Fax:  [phone]  Name: LOYDA COSTIN MRN: [id number] Date of Birth: [birth year]  Manus Gunning, PT 01/09/18 12:08 PM

## 2018-01-11 ENCOUNTER — Encounter: Payer: Self-pay | Admitting: Physical Therapy

## 2018-01-11 ENCOUNTER — Ambulatory Visit: Payer: Medicare Other | Admitting: Physical Therapy

## 2018-01-11 DIAGNOSIS — I890 Lymphedema, not elsewhere classified: Secondary | ICD-10-CM | POA: Diagnosis not present

## 2018-01-11 DIAGNOSIS — R262 Difficulty in walking, not elsewhere classified: Secondary | ICD-10-CM | POA: Diagnosis not present

## 2018-01-11 NOTE — Therapy (Signed)
[place], [place], [address] Phone: [phone]   Fax:  [phone]  Physical Therapy Treatment  Patient Details  Name: Alexandra Williams MRN: [id number] Date of Birth: 1941-08-19 Referring Provider: Deitra Mayo   Encounter Date: 01/11/2018  PT End of Session - 01/11/18 0809    Visit Number  7    Number of Visits  9    Date for PT Re-Evaluation  01/23/18    PT Start Time  0806    PT Stop Time  0847    PT Time Calculation (min)  41 min    Activity Tolerance  Patient tolerated treatment well    Behavior During Therapy  [facility] for tasks assessed/performed       Past Medical History:  Diagnosis Date  . Abnormal Pap smear 4/82   colpo/ecc negative  . Arthritis    knee  . Cervical polyp 1/99  . Colon polyps   . Diabetes mellitus without complication (HCC)    pre-diabetes  . Elevated lipids   . Fibrocystic breast   . Hives    undetermined-on 7 antihistamines  . Hyperlipidemia   . Hypertension   . Postmenopausal bleeding 3/99   on HRT/endometrial biopsy-focal simple hyperplasia  . Uterine fibroid   . Vasculitis (HCC)    bilat legs  . Vitamin D deficiency     Past Surgical History:  Procedure Laterality Date  . INCISION AND DRAINAGE     Bartholin cyst  . TONSILLECTOMY AND ADENOIDECTOMY     as a child    There were no vitals filed for this visit.  Subjective Assessment - 01/11/18 0806    Subjective  I took my bandages off last night to take a shower.     Pertinent History  Pt reports she began having swelling over 50 years ago when she was pregnant. She had her first cellulitis infection at that time. Her leg swelling would come and go until the past three to four years it has not gotten any better. Pt was treated for a cellulitis infection in October of 2016. Pt reports she does not have any heart or kidney problems, no family history of lymphedema, pt is in need of knee  replacements and reports her knees buckle occasionally and that is why she uses a cane    Patient Stated Goals  to see if we can get the swelling down so I don't get tired being on my feet    Currently in Pain?  No/denies                      [facility] Adult PT Treatment/Exercise - 01/11/18 0001      Manual Therapy   Manual Therapy  Compression Bandaging;Manual Lymphatic Drainage (MLD)    Manual Lymphatic Drainage (MLD)  short neck, superficial and deep abdominals, left axillary nodes and establishment of inguino axillary pathway, LLE working proximal to distal then retracing all steps    Compression Bandaging  lotion applied then thick stockinette from foot to groin, artiflex from foot to knee, 1 6cm, 1 8cm, 1 10cm and 1 12 cm from foot to knee, 1 - 12 cm donned at knee then donned abdominal binder to left thigh                  PT Long Term Goals - 12/26/17 1709      PT LONG TERM GOAL #1   Title  Pt will be able  to independently manage her lymphedema through use of compression bandages and appropriate compression garments.    Time  4    Period  Weeks    Status  New    Target Date  01/23/18      PT LONG TERM GOAL #2   Title  Pt will demonstrate 5 cm reduction of swelling 30 cm proximal to floor on lateral plantar surface of LLE to all improved ambulation    Baseline  57    Time  4    Period  Weeks    Status  New    Target Date  01/23/18      PT LONG TERM GOAL #3   Title  Pt will report increased ease with donning socks and pants due to reduction of swelling in LLE    Time  4    Period  Weeks    Status  New    Target Date  01/23/18            Plan - 01/11/18 0808    Clinical Impression Statement  Will remeasure at next session since pt took bandages off last night to shower since she had an early morning appointment this am. If pt's measurements are no longer decreasing will attempt to don pt's circaid again to LLE and implement bandaging to RLE.  Will discuss with pt possibility of getting two velcro garments so both legs can have compression at the same time.     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 (her husband passed away 17-May-2016)    PT Frequency  3x / week    PT Duration  4 weeks    PT Treatment/Interventions  ADLs/Self Care Home Management;Therapeutic exercise;Manual techniques;Manual lymph drainage;Compression bandaging;Passive range of motion;Taping;Vasopneumatic Device    PT Next Visit Plan  measure and assess goals, continue MLD to LLE and bandaging to LLE from foot to knee with abdominal binder at thigh, possibly add bandaging over the knee    Consulted and Agree with Plan of Care  Patient       Patient will benefit from skilled therapeutic intervention in order to improve the following deficits and impairments:  Increased edema, Decreased mobility  Visit Diagnosis: Lymphedema, not elsewhere classified     Problem List Patient Active Problem List   Diagnosis Date Noted  . Heart murmur 08/02/2017  . Diabetes type 2, controlled ([place]) 10/13/2016  . Morbid obesity ([place]) 10/08/2014  . Vasculitis ([place]) 10/08/2014  . Essential hypertension 10/08/2014  . Lymphedema 02/18/2014  . Mixed hyperlipidemia 05/14/2008    Allyson Sabal [facility] 01/11/2018, 8:48 AM  [place], [place], [address] Phone: [phone]   Fax:  [phone]  Name: Alexandra Williams MRN: [id number] Date of Birth: December 06, 1941  Manus Gunning, PT 01/11/18 8:48 AM

## 2018-01-14 ENCOUNTER — Ambulatory Visit: Payer: Medicare Other | Admitting: Physical Therapy

## 2018-01-15 DIAGNOSIS — S2001XA Contusion of right breast, initial encounter: Secondary | ICD-10-CM | POA: Diagnosis not present

## 2018-01-15 DIAGNOSIS — S8011XA Contusion of right lower leg, initial encounter: Secondary | ICD-10-CM | POA: Diagnosis not present

## 2018-01-16 ENCOUNTER — Encounter: Payer: Self-pay | Admitting: Physical Therapy

## 2018-01-16 ENCOUNTER — Ambulatory Visit: Payer: Medicare Other | Admitting: Physical Therapy

## 2018-01-16 DIAGNOSIS — I890 Lymphedema, not elsewhere classified: Secondary | ICD-10-CM

## 2018-01-16 DIAGNOSIS — R262 Difficulty in walking, not elsewhere classified: Secondary | ICD-10-CM
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
## 2018-01-18 ENCOUNTER — Encounter: Payer: Self-pay | Admitting: Physical Therapy

## 2018-01-18 ENCOUNTER — Ambulatory Visit: Payer: Medicare Other | Attending: Vascular Surgery | Admitting: Physical Therapy

## 2018-01-18 DIAGNOSIS — I890 Lymphedema, not elsewhere classified: Secondary | ICD-10-CM | POA: Insufficient documentation

## 2018-01-18 DIAGNOSIS — L501 Idiopathic urticaria: Secondary | ICD-10-CM | POA: Diagnosis not present

## 2018-01-18 DIAGNOSIS — R262 Difficulty in walking, not elsewhere classified: Secondary | ICD-10-CM | POA: Diagnosis not present
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
## 2018-01-23 ENCOUNTER — Ambulatory Visit: Payer: Medicare Other | Admitting: Physical Therapy

## 2018-01-23 ENCOUNTER — Encounter: Payer: Self-pay | Admitting: Physical Therapy

## 2018-01-23 DIAGNOSIS — R262 Difficulty in walking, not elsewhere classified: Secondary | ICD-10-CM | POA: Diagnosis not present

## 2018-01-23 DIAGNOSIS — I890 Lymphedema, not elsewhere classified: Secondary | ICD-10-CM

## 2018-01-23 NOTE — Therapy (Addendum)
Progress Note Reporting Period 12/26/17 to 01/23/18  See note below for Objective Data and Assessment of Progress/Goals.       [place], [place], [address] Phone: [phone]   Fax:  [phone]  Physical Therapy Treatment  Patient Details  Name: Alexandra Williams MRN: [id number] Date of Birth: 1941-09-19 Referring Provider: Deitra Mayo   Encounter Date: 01/23/2018  PT End of Session - 01/23/18 1156    Visit Number  10    Number of Visits  17    Date for PT Re-Evaluation  02/20/18    PT Start Time  0936    PT Stop Time  1018    PT Time Calculation (min)  42 min    Activity Tolerance  Patient tolerated treatment well    Behavior During Therapy  [facility] for tasks assessed/performed       Past Medical History:  Diagnosis Date  . Abnormal Pap smear 4/82   colpo/ecc negative  . Arthritis    knee  . Cervical polyp 1/99  . Colon polyps   . Diabetes mellitus without complication (HCC)    pre-diabetes  . Elevated lipids   . Fibrocystic breast   . Hives    undetermined-on 7 antihistamines  . Hyperlipidemia   . Hypertension   . Postmenopausal bleeding 3/99   on HRT/endometrial biopsy-focal simple hyperplasia  . Uterine fibroid   . Vasculitis (HCC)    bilat legs  . Vitamin D deficiency     Past Surgical History:  Procedure Laterality Date  . INCISION AND DRAINAGE     Bartholin cyst  . TONSILLECTOMY AND ADENOIDECTOMY     as a child    There were no vitals filed for this visit.  Subjective Assessment - 01/23/18 0941    Subjective  I have been wearing the velcro garment on the left leg since I was here last.     Pertinent History  Pt reports she began having swelling over 50 years ago when she was pregnant. She had her first cellulitis infection at that time. Her leg swelling would come and go until the past three to four years it has not gotten any better. Pt was treated for a  cellulitis infection in October of 2016. Pt reports she does not have any heart or kidney problems, no family history of lymphedema, pt is in need of knee replacements and reports her knees buckle occasionally and that is why she uses a cane    Patient Stated Goals  to see if we can get the swelling down so I don't get tired being on my feet    Currently in Pain?  No/denies    Pain Score  0-No pain            LYMPHEDEMA/ONCOLOGY QUESTIONNAIRE - 01/23/18 0941      Left Lower Extremity Lymphedema   30 cm Proximal to Floor at Lateral Plantar Foot  55.5 cm    20 cm Proximal to Floor at Lateral Plantar Foot  48.1 cm    10 cm Proximal to Floor at Lateral Malleoli  37 cm    5 cm Proximal to 1st MTP Joint  21 cm    Across MTP Joint  21 cm    Around Proximal Great Toe  7.5 cm               [facility] Adult PT Treatment/Exercise - 01/23/18 0001      Manual Therapy  Manual therapy comments  Circumferential mesurements taken on Lt LE, donned CircAid to RLE for managment of edema    Manual Lymphatic Drainage (MLD)  short neck, 5 diaphragmatic breaths, left axillary nodes and establishment of inguino axillary pathway, LLE working proximal to distal then retracing all steps                  PT Long Term Goals - 01/23/18 1203      PT LONG TERM GOAL #1   Title  Pt will be able to independently manage her lymphedema through use of compression bandages and appropriate compression garments.    Baseline  01/23/18- pt is reducing in regarding to her LLE and is now wearing a velcro compression garment on this side, she will benefit from bandaging to RLE and obtaining a compression garment to manage the RLE swelling    Time  4    Period  Weeks    Status  On-going      PT LONG TERM GOAL #2   Title  Pt will demonstrate 5 cm reduction of swelling 30 cm proximal to floor on lateral plantar surface of LLE to all improved ambulation    Baseline  57, 01/23/18- 55.5 cm    Time  4    Period   Weeks    Status  On-going      PT LONG TERM GOAL #3   Title  Pt will report increased ease with donning socks and pants due to reduction of swelling in LLE    Time  4    Period  Weeks    Status  On-going            Plan - 01/23/18 1157    Clinical Impression Statement  Measured circumference of pt's LLE since she has been wearing her velcro garment. Her foot stayed down but she did have an increase in circumference at 20 cm proximal to malleoli. Pt has not been sleeping in her garment but had been sleeping in her bandages. Educated pt to continue to wear velcro garment on LLE but to sleep in it at night and see if this manages her swelling better. Once her LLE measurements have stabilized and are controlled with the velcro compression garment will begin wrapping to RLE and then will get pt fitted for a velcro compression garment for this LE. Pt would benefit from additional skilled PT visits to continue to address edema and begin bandaging to RLE and assist pt with obtainining appropriate compression garments.     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 (her husband passed away 2016/05/19)    PT Frequency  3x / week    PT Duration  4 weeks    PT Treatment/Interventions  ADLs/Self Care Home Management;Therapeutic exercise;Manual techniques;Manual lymph drainage;Compression bandaging;Passive range of motion;Taping;Vasopneumatic Device    PT Next Visit Plan  measure LLE and compare to this session, continue MLD to LLE and bandaging to LLE from foot to knee with abdominal binder at thigh, re measure RLE and see if it decreased with use of CircAid    Consulted and Agree with Plan of Care  Patient       Patient will benefit from skilled therapeutic intervention in order to improve the following deficits and impairments:  Increased edema, Decreased mobility  Visit Diagnosis: Lymphedema, not  elsewhere classified - Plan: PT plan of care cert/re-cert  Difficulty in walking, not elsewhere classified - Plan: PT plan of care cert/re-cert     Problem List Patient Active Problem List   Diagnosis Date Noted  . Heart murmur 08/02/2017  . Diabetes type 2, controlled ([place]) 10/13/2016  . Morbid obesity ([place]) 10/08/2014  . Vasculitis ([place]) 10/08/2014  . Essential hypertension 10/08/2014  . Lymphedema 02/18/2014  . Mixed hyperlipidemia 05/14/2008    Allyson Sabal [facility] 01/23/2018, 12:04 PM  [place], [place], [address] Phone: [phone]   Fax:  [phone]  Name: Alexandra Williams MRN: [id number] Date of Birth: 31-Mar-1941  Manus Gunning, PT 01/23/18 12:04 PM

## 2018-01-25 ENCOUNTER — Ambulatory Visit: Payer: Medicare Other | Admitting: Physical Therapy

## 2018-01-25 DIAGNOSIS — R262 Difficulty in walking, not elsewhere classified: Secondary | ICD-10-CM | POA: Diagnosis not present

## 2018-01-25 DIAGNOSIS — I890 Lymphedema, not elsewhere classified: Secondary | ICD-10-CM | POA: Diagnosis not present

## 2018-01-25 NOTE — Therapy (Signed)
[place], [place], [address] Phone: [phone]   Fax:  [phone]  Physical Therapy Treatment  Patient Details  Name: Alexandra Williams MRN: [id number] Date of Birth: [birth year] Referring Provider: Deitra Mayo   Encounter Date: 01/25/2018  PT End of Session - 01/25/18 1219    Visit Number  11    Number of Visits  17    Date for PT Re-Evaluation  02/20/18    PT Start Time  0845    PT Stop Time  0944    PT Time Calculation (min)  59 min    Activity Tolerance  Patient tolerated treatment well    Behavior During Therapy  [facility] for tasks assessed/performed       Past Medical History:  Diagnosis Date  . Abnormal Pap smear 4/82   colpo/ecc negative  . Arthritis    knee  . Cervical polyp 1/99  . Colon polyps   . Diabetes mellitus without complication (HCC)    pre-diabetes  . Elevated lipids   . Fibrocystic breast   . Hives    undetermined-on 7 antihistamines  . Hyperlipidemia   . Hypertension   . Postmenopausal bleeding 3/99   on HRT/endometrial biopsy-focal simple hyperplasia  . Uterine fibroid   . Vasculitis (HCC)    bilat legs  . Vitamin D deficiency     Past Surgical History:  Procedure Laterality Date  . INCISION AND DRAINAGE     Bartholin cyst  . TONSILLECTOMY AND ADENOIDECTOMY     as a child    There were no vitals filed for this visit.  Subjective Assessment - 01/25/18 0849    Subjective  "I would say it went okay with the Circaid.  I kept it on until this morning when I took it off for a shower.  It's easier than wrapping."    Pertinent History  Pt reports she began having swelling over 50 years ago when she was pregnant. She had her first cellulitis infection at that time. Her leg swelling would come and go until the past three to four years it has not gotten any better. Pt was treated for a cellulitis infection in October of 2016. Pt reports she does not have any heart  or kidney problems, no family history of lymphedema, pt is in need of knee replacements and reports her knees buckle occasionally and that is why she uses a cane    Currently in Pain?  Yes    Pain Score  1     Pain Location  Foot    Pain Orientation  Left;Other (Comment);Lateral dorsal    Pain Descriptors / Indicators  Dull    Aggravating Factors   shoe on, and the pressure of the end of the Circaid and the shoe combined    Pain Relieving Factors  shoe off            LYMPHEDEMA/ONCOLOGY QUESTIONNAIRE - 01/25/18 0852      Left Lower Extremity Lymphedema   30 cm Proximal to Floor at Lateral Plantar Foot  52 cm    20 cm Proximal to Floor at Lateral Plantar Foot  43 cm    10 cm Proximal to Floor at Lateral Malleoli  34.6 cm    5 cm Proximal to 1st MTP Joint  22.4 cm    Across MTP Joint  21.6 cm    Around Proximal Great Toe  7.8 cm    Other  leg tissue feels very soft  where she was in the [place] Adult PT Treatment/Exercise - 01/25/18 0001      Manual Therapy   Manual therapy comments  Circumferential mesurements taken on Lt LE, donned CircAid to  Lt. LE for managment of edema    Manual Lymphatic Drainage (MLD)  short neck, 5 diaphragmatic breaths, Rt. axillary nodes and establishment of inguino axillary pathway, Rt. LE working proximal to distal then retracing all steps    Compression Bandaging  Bandaging of right leg from foot to knee with thick stockinette, Artiflex x 2, 1-6 cm., 1-8 cm., and 3-12 cm. short stretch bandages, with the second of three 12 cm. bandages in herringbone.                  PT Long Term Goals - 01/23/18 1203      PT LONG TERM GOAL #1   Title  Pt will be able to independently manage her lymphedema through use of compression bandages and appropriate compression garments.    Baseline  01/23/18- pt is reducing in regarding to her LLE and is now wearing a velcro compression garment on this side, she will benefit from  bandaging to RLE and obtaining a compression garment to manage the RLE swelling    Time  4    Period  Weeks    Status  On-going      PT LONG TERM GOAL #2   Title  Pt will demonstrate 5 cm reduction of swelling 30 cm proximal to floor on lateral plantar surface of LLE to all improved ambulation    Baseline  57, 01/23/18- 55.5 cm    Time  4    Period  Weeks    Status  On-going      PT LONG TERM GOAL #3   Title  Pt will report increased ease with donning socks and pants due to reduction of swelling in LLE    Time  4    Period  Weeks    Status  On-going            Plan - 01/25/18 1220    Clinical Impression Statement  Pt. was very pleased with the look of her left leg after wearing the Circaid day and night since last visit.  Measurements of foot were a bit larger, but of leg were reduced nicely today, so plan is to continue to use Circaid on left leg.  Began bandaging right foot and lower leg today.    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  Continue manual lymph drainage and bandaging to right LE.    Consulted and Agree with Plan of Care  Patient       Patient will benefit from skilled therapeutic intervention in order to improve the following deficits and impairments:  Increased edema, Decreased mobility  Visit Diagnosis: Lymphedema, not elsewhere classified     Problem List Patient Active Problem List   Diagnosis Date Noted  . Heart murmur 08/02/2017  . Diabetes type 2,  controlled ([place]) 10/13/2016  . Morbid obesity ([place]) 10/08/2014   . Vasculitis ([place]) 10/08/2014  . Essential hypertension 10/08/2014  . Lymphedema 02/18/2014  . Mixed hyperlipidemia 05/14/2008    Bronnie Vasseur 01/25/2018, 12:22 PM  [place], [place], [address] Phone: [phone]   Fax:  [phone]  Name: SHIANE WENBERG MRN: [id number] Date of Birth: [birth year]  Serafina Royals, PT 01/25/18 12:22 PM

## 2018-01-28 ENCOUNTER — Ambulatory Visit: Payer: Medicare Other | Admitting: Physical Therapy

## 2018-01-28 ENCOUNTER — Encounter: Payer: Self-pay | Admitting: Physical Therapy

## 2018-01-28 DIAGNOSIS — R262 Difficulty in walking, not elsewhere classified: Secondary | ICD-10-CM

## 2018-01-28 DIAGNOSIS — I890 Lymphedema, not elsewhere classified: Secondary | ICD-10-CM
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
## 2018-01-29 DIAGNOSIS — L501 Idiopathic urticaria: Secondary | ICD-10-CM | POA: Diagnosis not present

## 2018-01-30 ENCOUNTER — Encounter: Payer: Self-pay | Admitting: Physical Therapy

## 2018-01-30 ENCOUNTER — Ambulatory Visit: Payer: Medicare Other | Admitting: Physical Therapy

## 2018-01-30 DIAGNOSIS — I890 Lymphedema, not elsewhere classified: Secondary | ICD-10-CM | POA: Diagnosis not present

## 2018-01-30 DIAGNOSIS — R262 Difficulty in walking, not elsewhere classified: Secondary | ICD-10-CM | POA: Diagnosis not present

## 2018-01-30 DIAGNOSIS — M791 Myalgia, unspecified site: Secondary | ICD-10-CM | POA: Diagnosis not present

## 2018-01-30 NOTE — Therapy (Signed)
[place], [place], [address] Phone: [phone]   Fax:  [phone]  Physical Therapy Treatment  Patient Details  Name: Alexandra Williams MRN: [id number] Date of Birth: Aug 15, 1941 Referring Provider: Deitra Williams   Encounter Date: 01/30/2018  PT End of Session - 01/30/18 1203    Visit Number  13    Number of Visits  17    Date for PT Re-Evaluation  02/20/18    PT Start Time  1023    PT Stop Time  1103    PT Time Calculation (min)  40 min    Activity Tolerance  Patient tolerated treatment well    Behavior During Therapy  [facility] for tasks assessed/performed       Past Medical History:  Diagnosis Date  . Abnormal Pap smear 4/82   colpo/ecc negative  . Arthritis    knee  . Cervical polyp 1/99  . Colon polyps   . Diabetes mellitus without complication (HCC)    pre-diabetes  . Elevated lipids   . Fibrocystic breast   . Hives    undetermined-on 7 antihistamines  . Hyperlipidemia   . Hypertension   . Postmenopausal bleeding 3/99   on HRT/endometrial biopsy-focal simple hyperplasia  . Uterine fibroid   . Vasculitis (HCC)    bilat legs  . Vitamin D deficiency     Past Surgical History:  Procedure Laterality Date  . INCISION AND DRAINAGE     Alexandra Williams cyst  . TONSILLECTOMY AND ADENOIDECTOMY     as a child    There were no vitals filed for this visit.  Subjective Assessment - 01/30/18 1023    Subjective  When I got home from the wrapping the other day I could not wait to get it off. The bandages are so heavy that I am afraid I am going to hit the wrong pedal when driving. I only took off the top 2 bandages today.     Pertinent History  Pt reports she began having swelling over 50 years ago when she was pregnant. She had her first cellulitis infection at that time. Her leg swelling would come and go until the past three to four years it has not gotten any better. Pt was treated for a  cellulitis infection in October of 2016. Pt reports she does not have any heart or kidney problems, no family history of lymphedema, pt is in need of knee replacements and reports her knees buckle occasionally and that is why she uses a cane    Patient Stated Goals  to see if we can get the swelling down so I don't get tired being on my feet    Currently in Pain?  No/denies    Pain Score  0-No pain            LYMPHEDEMA/ONCOLOGY QUESTIONNAIRE - 01/30/18 1025      Right Lower Extremity Lymphedema   30 cm Proximal to Floor at Lateral Plantar Foot  52 cm    20 cm Proximal to Floor at Lateral Plantar Foot  43.6 1    10  cm Proximal to Floor at Lateral Malleoli  29 cm    5 cm Proximal to 1st MTP Joint  20.5 cm    Across MTP Joint  21 cm    Around Proximal Great Toe  7.6 cm      Left Lower Extremity Lymphedema   30 cm Proximal to Floor at Lateral Plantar Foot  48 cm  20 cm Proximal to Floor at Lateral Plantar Foot  46.5 cm    10 cm Proximal to Floor at Lateral Malleoli  34.2 cm    5 cm Proximal to 1st MTP Joint  21.2 cm    Across MTP Joint  21.6 cm    Around Proximal Great Toe  7.7 cm               [facility] Adult PT Treatment/Exercise - 01/30/18 0001      Manual Therapy   Manual Lymphatic Drainage (MLD)  short neck, 5 diaphragmatic breaths, Rt. axillary nodes and establishment of inguino axillary pathway, Rt. LE working proximal to distal then retracing all steps    Compression Bandaging  to RLE: thin stockinette, artiflex from foot to knee, 1 8 cm bandage at foot and ankle, 2 12 cm bandages from ankle to knee with the last one in herribone pattern changed bandaging technique to allow pt to don her shoe                  PT Long Term Goals - 01/23/18 1203      PT LONG TERM GOAL #1   Title  Pt will be able to independently manage her lymphedema through use of compression bandages and appropriate compression garments.    Baseline  01/23/18- pt is reducing in regarding  to her LLE and is now wearing a velcro compression garment on this side, she will benefit from bandaging to RLE and obtaining a compression garment to manage the RLE swelling    Time  4    Period  Weeks    Status  On-going      PT LONG TERM GOAL #2   Title  Pt will demonstrate 5 cm reduction of swelling 30 cm proximal to floor on lateral plantar surface of LLE to all improved ambulation    Baseline  57, 01/23/18- 55.5 cm    Time  4    Period  Weeks    Status  On-going      PT LONG TERM GOAL #3   Title  Pt will report increased ease with donning socks and pants due to reduction of swelling in LLE    Time  4    Period  Weeks    Status  On-going            Plan - 01/30/18 1205    Clinical Impression Statement  Circumferential measurements taken today. Pt demonstrates a decrease in edema in RLE and LLE. Will send of an Rx to her Dr. to get signed so she can obtain bilateral velcro compression garments from DME supllier. Re bandaged her RLE today using less bandages so pt can still don her shoe and be able to drive her car safely.     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 (her husband passed away May 20, 2016)    PT Frequency  3x / week    PT Duration  4 weeks    PT Treatment/Interventions  ADLs/Self Care Home Management;Therapeutic exercise;Manual techniques;Manual lymph drainage;Compression bandaging;Passive range of motion;Taping;Vasopneumatic Device    PT Next Visit Plan    Continue manual lymph drainage and bandaging to right LE, see if signed Rx is back    Consulted and Agree with Plan of Care  Patient       Patient will benefit from skilled therapeutic intervention in order to  improve the following deficits and impairments:  Increased edema, Decreased mobility  Visit Diagnosis: Lymphedema, not elsewhere classified  Difficulty in walking, not elsewhere  classified     Problem List Patient Active Problem List   Diagnosis Date Noted  . Heart murmur 08/02/2017  . Diabetes type 2, controlled ([place]) 10/13/2016  . Morbid obesity ([place]) 10/08/2014  . Vasculitis ([place]) 10/08/2014  . Essential hypertension 10/08/2014  . Lymphedema 02/18/2014  . Mixed hyperlipidemia 05/14/2008    Allyson Sabal [facility] 01/30/2018, 12:07 PM  [place], [place], [address] Phone: [phone]   Fax:  [phone]  Name: Alexandra Williams MRN: [id number] Date of Birth: 05-Feb-1941  Manus Gunning, PT 01/30/18 12:07 PM

## 2018-01-31 ENCOUNTER — Ambulatory Visit: Payer: Medicare Other | Admitting: Obstetrics & Gynecology

## 2018-01-31 DIAGNOSIS — L501 Idiopathic urticaria: Secondary | ICD-10-CM | POA: Diagnosis not present

## 2018-01-31 DIAGNOSIS — J301 Allergic rhinitis due to pollen: Secondary | ICD-10-CM | POA: Diagnosis not present

## 2018-01-31 DIAGNOSIS — J3089 Other allergic rhinitis: Secondary | ICD-10-CM | POA: Diagnosis not present

## 2018-01-31 DIAGNOSIS — T783XXA Angioneurotic edema, initial encounter: Secondary | ICD-10-CM | POA: Diagnosis not present

## 2018-02-01 ENCOUNTER — Encounter: Payer: Medicare Other | Admitting: Physical Therapy

## 2018-02-04 ENCOUNTER — Ambulatory Visit: Payer: Medicare Other | Admitting: Physical Therapy

## 2018-02-04 ENCOUNTER — Encounter: Payer: Self-pay | Admitting: Physical Therapy

## 2018-02-04 DIAGNOSIS — I890 Lymphedema, not elsewhere classified: Secondary | ICD-10-CM

## 2018-02-04 DIAGNOSIS — R262 Difficulty in walking, not elsewhere classified: Secondary | ICD-10-CM

## 2018-02-04 NOTE — Therapy (Signed)
[place], [place], [address] Phone: [phone]   Fax:  [phone]  Physical Therapy Treatment  Patient Details  Name: Alexandra Williams MRN: [id number] Date of Birth: Nov 27, 1941 Referring Provider: Deitra Mayo   Encounter Date: 02/04/2018  PT End of Session - 02/04/18 1700    Visit Number  14    Number of Visits  17    Date for PT Re-Evaluation  02/20/18    PT Start Time  6962    PT Stop Time  1522    PT Time Calculation (min)  50 min    Activity Tolerance  Patient tolerated treatment well    Behavior During Therapy  [facility] for tasks assessed/performed       Past Medical History:  Diagnosis Date  . Abnormal Pap smear 4/82   colpo/ecc negative  . Arthritis    knee  . Cervical polyp 1/99  . Colon polyps   . Diabetes mellitus without complication (HCC)    pre-diabetes  . Elevated lipids   . Fibrocystic breast   . Hives    undetermined-on 7 antihistamines  . Hyperlipidemia   . Hypertension   . Postmenopausal bleeding 3/99   on HRT/endometrial biopsy-focal simple hyperplasia  . Uterine fibroid   . Vasculitis (HCC)    bilat legs  . Vitamin D deficiency     Past Surgical History:  Procedure Laterality Date  . INCISION AND DRAINAGE     Bartholin cyst  . TONSILLECTOMY AND ADENOIDECTOMY     as a child    There were no vitals filed for this visit.  Subjective Assessment - 02/04/18 1435    Subjective  I am having a lot of wrist pain that is why I missed my appointment. I can not drive. This happens sometimes and I think it is from arthritis. The weather really affects it.     Pertinent History  Pt reports she began having swelling over 50 years ago when she was pregnant. She had her first cellulitis infection at that time. Her leg swelling would come and go until the past three to four years it has not gotten any better. Pt was treated for a cellulitis infection in October of 2016.  Pt reports she does not have any heart or kidney problems, no family history of lymphedema, pt is in need of knee replacements and reports her knees buckle occasionally and that is why she uses a cane    Patient Stated Goals  to see if we can get the swelling down so I don't get tired being on my feet    Currently in Pain?  Yes    Pain Score  7     Pain Location  Wrist    Pain Orientation  Right    Pain Descriptors / Indicators  Dull;Stabbing                      [facility] Adult PT Treatment/Exercise - 02/04/18 0001      Manual Therapy   Manual Lymphatic Drainage (MLD)  performed by Shan Levans, PT and observed by this therapist: short neck, superficial and deep abdominals, Rt. axillary nodes and establishment of inguino axillary pathway, Rt. LE working proximal to distal then retracing all steps    Compression Bandaging  performed by Shan Levans, PT and observed by this therapist: to RLE: thin stockinette, artiflex from foot to knee, 1 8 cm bandage at foot and ankle, 2 12 cm  bandages from ankle to knee with the last one in herribone pattern changed bandaging technique to allow pt to don her shoe                  PT Long Term Goals - 01/23/18 1203      PT LONG TERM GOAL #1   Title  Pt will be able to independently manage her lymphedema through use of compression bandages and appropriate compression garments.    Baseline  01/23/18- pt is reducing in regarding to her LLE and is now wearing a velcro compression garment on this side, she will benefit from bandaging to RLE and obtaining a compression garment to manage the RLE swelling    Time  4    Period  Weeks    Status  On-going      PT LONG TERM GOAL #2   Title  Pt will demonstrate 5 cm reduction of swelling 30 cm proximal to floor on lateral plantar surface of LLE to all improved ambulation    Baseline  57, 01/23/18- 55.5 cm    Time  4    Period  Weeks    Status  On-going      PT LONG TERM GOAL #3   Title  Pt will  report increased ease with donning socks and pants due to reduction of swelling in LLE    Time  4    Period  Weeks    Status  On-going            Plan - 02/04/18 1701    Clinical Impression Statement  Pt's old velcro compression garment has been managing the swelling of her left leg well. The garment itself is too short which causes increased fullness just above the garment. Awaiting the signed rx back from the doctor so pt can obtain new bilateral velcro compression garments for bilateral LEs. Continued with MLD and bandaging to RLE today to obtain maximal reduction prior to being measured for compression garments.     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 (her husband passed away 05-09-16)    PT Frequency  3x / week    PT Duration  4 weeks    PT Treatment/Interventions  ADLs/Self Care Home Management;Therapeutic exercise;Manual techniques;Manual lymph drainage;Compression bandaging;Passive range of motion;Taping;Vasopneumatic Device    PT Next Visit Plan    Continue manual lymph drainage and bandaging to right LE, see if signed Rx is back    Consulted and Agree with Plan of Care  Patient       Patient will benefit from skilled therapeutic intervention in order to improve the following deficits and impairments:  Increased edema, Decreased mobility  Visit Diagnosis: Lymphedema, not elsewhere classified  Difficulty in walking, not elsewhere classified     Problem List Patient Active Problem List   Diagnosis Date Noted  . Heart murmur 08/02/2017  . Diabetes type 2, controlled ([place]) 10/13/2016  . Morbid obesity ([place]) 10/08/2014  . Vasculitis ([place]) 10/08/2014  . Essential hypertension 10/08/2014  . Lymphedema 02/18/2014  . Mixed hyperlipidemia 05/14/2008    Allyson Sabal [facility] 02/04/2018, 5:04 PM  [place], [place], [address] Phone: [phone]   Fax:  [phone]  Name: JISSELLE POTH MRN: [id number] Date of Birth: 1941/04/30  Manus Gunning, PT 02/04/18 5:04 PM

## 2018-02-06 ENCOUNTER — Ambulatory Visit: Payer: Medicare Other | Admitting: Physical Therapy

## 2018-02-06 ENCOUNTER — Encounter: Payer: Self-pay | Admitting: Physical Therapy

## 2018-02-06 DIAGNOSIS — I890 Lymphedema, not elsewhere classified: Secondary | ICD-10-CM | POA: Diagnosis not present

## 2018-02-06 DIAGNOSIS — R262 Difficulty in walking, not elsewhere classified: Secondary | ICD-10-CM | POA: Diagnosis not present

## 2018-02-06 NOTE — Therapy (Signed)
[place], [place], [address] Phone: [phone]   Fax:  [phone]  Physical Therapy Treatment  Patient Details  Name: Alexandra Williams MRN: [id number] Date of Birth: October 04, 1941 Referring Provider: Deitra Mayo   Encounter Date: 02/06/2018  PT End of Session - 02/06/18 1523    Visit Number  15    Number of Visits  17    Date for PT Re-Evaluation  02/20/18    PT Start Time  1435    PT Stop Time  1520    PT Time Calculation (min)  45 min    Activity Tolerance  Patient tolerated treatment well    Behavior During Therapy  [facility] for tasks assessed/performed       Past Medical History:  Diagnosis Date  . Abnormal Pap smear 4/82   colpo/ecc negative  . Arthritis    knee  . Cervical polyp 1/99  . Colon polyps   . Diabetes mellitus without complication (HCC)    pre-diabetes  . Elevated lipids   . Fibrocystic breast   . Hives    undetermined-on 7 antihistamines  . Hyperlipidemia   . Hypertension   . Postmenopausal bleeding 3/99   on HRT/endometrial biopsy-focal simple hyperplasia  . Uterine fibroid   . Vasculitis (HCC)    bilat legs  . Vitamin D deficiency     Past Surgical History:  Procedure Laterality Date  . INCISION AND DRAINAGE     Bartholin cyst  . TONSILLECTOMY AND ADENOIDECTOMY     as a child    There were no vitals filed for this visit.  Subjective Assessment - 02/06/18 1435    Subjective  The bandages did fine. I took them off last night because it was coming down and bunching around my ankles.     Pertinent History  Pt reports she began having swelling over 50 years ago when she was pregnant. She had her first cellulitis infection at that time. Her leg swelling would come and go until the past three to four years it has not gotten any better. Pt was treated for a cellulitis infection in October of 2016. Pt reports she does not have any heart or kidney problems, no  family history of lymphedema, pt is in need of knee replacements and reports her knees buckle occasionally and that is why she uses a cane    Patient Stated Goals  to see if we can get the swelling down so I don't get tired being on my feet    Currently in Pain?  Yes    Pain Score  8     Pain Location  Wrist    Pain Orientation  Right                      [facility] Adult PT Treatment/Exercise - 02/06/18 0001      Manual Therapy   Manual Lymphatic Drainage (MLD)  short neck, 5 diaphragmatic breaths, Rt. axillary nodes and establishment of inguino axillary pathway, Rt. LE working proximal to distal then retracing all steps    Compression Bandaging  to RLE: thick stockinette, artiflex from foot to knee, 1 8 cm bandage at foot and ankle, 2 12 cm bandages from ankle to knee with the last one in herribone pattern, to LLE: thick stockinette to ankle, artiflex to ankle, 1 6cm to foot and 1 8 cm to ankle changed bandaging technique to allow pt to don her shoe  PT Long Term Goals - 01/23/18 1203      PT LONG TERM GOAL #1   Title  Pt will be able to independently manage her lymphedema through use of compression bandages and appropriate compression garments.    Baseline  01/23/18- pt is reducing in regarding to her LLE and is now wearing a velcro compression garment on this side, she will benefit from bandaging to RLE and obtaining a compression garment to manage the RLE swelling    Time  4    Period  Weeks    Status  On-going      PT LONG TERM GOAL #2   Title  Pt will demonstrate 5 cm reduction of swelling 30 cm proximal to floor on lateral plantar surface of LLE to all improved ambulation    Baseline  57, 01/23/18- 55.5 cm    Time  4    Period  Weeks    Status  On-going      PT LONG TERM GOAL #3   Title  Pt will report increased ease with donning socks and pants due to reduction of swelling in LLE    Time  4    Period  Weeks    Status  On-going             Plan - 02/06/18 1523    Clinical Impression Statement  Continued with MLD to RLE and compression bandaging to this area. Still awaiting signed Rx from pt's doctor so pt can obtain Rx garments. Added bandaging just to left foot and ankle so pt can have compression on this area. She had been placing her CircAid low to provide compression to this area but since it is short it left a large area inferior to her knee with no compression. With the bandages intact she will place her compression garments up higher to manage this area.     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 (her husband passed away Jun 02, 2016)    PT Frequency  3x / week    PT Duration  4 weeks    PT Treatment/Interventions  ADLs/Self Care Home Management;Therapeutic exercise;Manual techniques;Manual lymph drainage;Compression bandaging;Passive range of motion;Taping;Vasopneumatic Device    PT Next Visit Plan    Continue manual lymph drainage and bandaging to right LE, see if signed Rx is back    Consulted and Agree with Plan of Care  Patient       Patient will benefit from skilled therapeutic intervention in order to improve the following deficits and impairments:  Increased edema, Decreased mobility  Visit Diagnosis: Lymphedema, not elsewhere classified     Problem List Patient Active Problem List   Diagnosis Date Noted  . Heart murmur 08/02/2017  . Diabetes type 2, controlled ([place]) 10/13/2016  . Morbid obesity ([place]) 10/08/2014  . Vasculitis ([place]) 10/08/2014  . Essential hypertension 10/08/2014  . Lymphedema 02/18/2014  . Mixed hyperlipidemia 05/14/2008    Allyson Sabal [facility] 02/06/2018, 3:25 PM  [place], [place], [address] Phone: [phone]   Fax:  [phone]  Name: Alexandra Williams MRN: [id number] Date of Birth:  1941/07/08  Manus Gunning, PT 02/06/18 3:25 PM

## 2018-02-07 DIAGNOSIS — M6520 Calcific tendinitis, unspecified site: Secondary | ICD-10-CM | POA: Diagnosis not present

## 2018-02-07 DIAGNOSIS — M25531 Pain in right wrist: Secondary | ICD-10-CM | POA: Diagnosis not present

## 2018-02-07 DIAGNOSIS — S6981XA Other specified injuries of right wrist, hand and finger(s), initial encounter: Secondary | ICD-10-CM | POA: Diagnosis not present

## 2018-02-08 ENCOUNTER — Ambulatory Visit: Payer: Medicare Other

## 2018-02-08 ENCOUNTER — Encounter: Payer: Medicare Other | Admitting: Physical Therapy

## 2018-02-11 ENCOUNTER — Ambulatory Visit: Payer: Medicare Other | Admitting: Physical Therapy

## 2018-02-11 ENCOUNTER — Encounter: Payer: Self-pay | Admitting: Physical Therapy

## 2018-02-11 DIAGNOSIS — I890 Lymphedema, not elsewhere classified: Secondary | ICD-10-CM | POA: Diagnosis not present

## 2018-02-11 DIAGNOSIS — R262 Difficulty in walking, not elsewhere classified: Secondary | ICD-10-CM

## 2018-02-11 NOTE — Therapy (Signed)
[place], [place], [address] Phone: [phone]   Fax:  [phone]  Physical Therapy Treatment  Patient Details  Name: Alexandra Williams MRN: [id number] Date of Birth: [birth year] Referring Provider: Deitra Mayo   Encounter Date: 02/11/2018  PT End of Session - 02/11/18 1706    Visit Number  16    Number of Visits  17    Date for PT Re-Evaluation  02/20/18    PT Start Time  1350    PT Stop Time  1437    PT Time Calculation (min)  47 min    Activity Tolerance  Patient tolerated treatment well    Behavior During Therapy  [facility] for tasks assessed/performed       Past Medical History:  Diagnosis Date  . Abnormal Pap smear 4/82   colpo/ecc negative  . Arthritis    knee  . Cervical polyp 1/99  . Colon polyps   . Diabetes mellitus without complication (HCC)    pre-diabetes  . Elevated lipids   . Fibrocystic breast   . Hives    undetermined-on 7 antihistamines  . Hyperlipidemia   . Hypertension   . Postmenopausal bleeding 3/99   on HRT/endometrial biopsy-focal simple hyperplasia  . Uterine fibroid   . Vasculitis (HCC)    bilat legs  . Vitamin D deficiency     Past Surgical History:  Procedure Laterality Date  . INCISION AND DRAINAGE     Bartholin cyst  . TONSILLECTOMY AND ADENOIDECTOMY     as a child    There were no vitals filed for this visit.  Subjective Assessment - 02/11/18 1357    Subjective  I have had these bandages on since yesterday. They stayed on for several days after you put them on.     Pertinent History  Pt reports she began having swelling over 50 years ago when she was pregnant. She had her first cellulitis infection at that time. Her leg swelling would come and go until the past three to four years it has not gotten any better. Pt was treated for a cellulitis infection in October of 2016. Pt reports she does not have any heart or kidney problems, no family  history of lymphedema, pt is in need of knee replacements and reports her knees buckle occasionally and that is why she uses a cane    Patient Stated Goals  to see if we can get the swelling down so I don't get tired being on my feet    Currently in Pain?  Yes    Pain Score  6     Pain Location  Wrist    Pain Orientation  Right                      [facility] Adult PT Treatment/Exercise - 02/11/18 0001      Manual Therapy   Manual Lymphatic Drainage (MLD)  short neck, 5 diaphragmatic breaths, Rt. axillary nodes and establishment of inguino axillary pathway, Rt. LE working proximal to distal then retracing all steps    Compression Bandaging  to RLE: thick stockinette, artiflex from foot to knee, 1 8 cm bandage at foot and ankle, 2 12 cm bandages from ankle to knee with the last one in herribone pattern, to LLE: cut pt's original foot/ankle velcro piece to fit pt's foot, donned this with her velcro compression garment from ankle to knee changed bandaging technique to allow pt to don her shoe  PT Long Term Goals - 01/23/18 1203      PT LONG TERM GOAL #1   Title  Pt will be able to independently manage her lymphedema through use of compression bandages and appropriate compression garments.    Baseline  01/23/18- pt is reducing in regarding to her LLE and is now wearing a velcro compression garment on this side, she will benefit from bandaging to RLE and obtaining a compression garment to manage the RLE swelling    Time  4    Period  Weeks    Status  On-going      PT LONG TERM GOAL #2   Title  Pt will demonstrate 5 cm reduction of swelling 30 cm proximal to floor on lateral plantar surface of LLE to all improved ambulation    Baseline  57, 01/23/18- 55.5 cm    Time  4    Period  Weeks    Status  On-going      PT LONG TERM GOAL #3   Title  Pt will report increased ease with donning socks and pants due to reduction of swelling in LLE    Time  4    Period   Weeks    Status  On-going            Plan - 02/11/18 1707    Clinical Impression Statement  Received signed Rx from pt's referring doctor and sent this with patient as well as instructions for how to obtain new velcro compression garments for bilateral LEs. Pt brought in her old [employer] foot and ankle piece that previously had not worked well for her. It was too large so it was cut to fit pt's current foot size. Donned this with velcro leg piece on LLE and bandaged RLE. If pt likes the foot/ankle velcro piece will have her obtain this for right foot but if not pt will be measured for a longer leg piece that will cover her ankle.     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 (her husband passed away 05/31/16)    PT Frequency  3x / week    PT Duration  4 weeks    PT Treatment/Interventions  ADLs/Self Care Home Management;Therapeutic exercise;Manual techniques;Manual lymph drainage;Compression bandaging;Passive range of motion;Taping;Vasopneumatic Device    PT Next Visit Plan    Continue manual lymph drainage and bandaging to right LE, see if pt was measured for garments    Consulted and Agree with Plan of Care  Patient       Patient will benefit from skilled therapeutic intervention in order to improve the following deficits and impairments:  Increased edema, Decreased mobility  Visit Diagnosis: Lymphedema, not elsewhere classified  Difficulty in walking, not elsewhere classified     Problem List Patient Active Problem List   Diagnosis Date Noted  . Heart murmur 08/02/2017  . Diabetes type 2, controlled ([place]) 10/13/2016  . Morbid obesity ([place]) 10/08/2014  . Vasculitis ([place]) 10/08/2014  . Essential hypertension 10/08/2014  . Lymphedema 02/18/2014  . Mixed hyperlipidemia 05/14/2008    Allyson Sabal [facility] 02/11/2018, 5:09 PM  [place], [place], [address] Phone: [phone]   Fax:  [phone]  Name: Alexandra Williams MRN: [id number] Date of Birth: 1941-04-01  Manus Gunning, PT 02/11/18 5:10 PM

## 2018-02-13 ENCOUNTER — Encounter: Payer: Self-pay | Admitting: Physical Therapy

## 2018-02-13 ENCOUNTER — Ambulatory Visit: Payer: Medicare Other | Admitting: Physical Therapy

## 2018-02-13 DIAGNOSIS — I890 Lymphedema, not elsewhere classified: Secondary | ICD-10-CM | POA: Diagnosis not present

## 2018-02-13 DIAGNOSIS — R262 Difficulty in walking, not elsewhere classified: Secondary | ICD-10-CM | POA: Diagnosis not present

## 2018-02-13 NOTE — Therapy (Signed)
[place], [place], [address] Phone: [phone]   Fax:  [phone]  Physical Therapy Treatment  Patient Details  Name: Alexandra Williams MRN: [id number] Date of Birth: 03/01/1941 Referring Provider: Deitra Mayo   Encounter Date: 02/13/2018  PT End of Session - 02/13/18 1620    Visit Number  17    Number of Visits  17    Date for PT Re-Evaluation  02/20/18    PT Start Time  0272    PT Stop Time  1520    PT Time Calculation (min)  46 min    Activity Tolerance  Patient tolerated treatment well    Behavior During Therapy  [facility] for tasks assessed/performed       Past Medical History:  Diagnosis Date  . Abnormal Pap smear 4/82   colpo/ecc negative  . Arthritis    knee  . Cervical polyp 1/99  . Colon polyps   . Diabetes mellitus without complication (HCC)    pre-diabetes  . Elevated lipids   . Fibrocystic breast   . Hives    undetermined-on 7 antihistamines  . Hyperlipidemia   . Hypertension   . Postmenopausal bleeding 3/99   on HRT/endometrial biopsy-focal simple hyperplasia  . Uterine fibroid   . Vasculitis (HCC)    bilat legs  . Vitamin D deficiency     Past Surgical History:  Procedure Laterality Date  . INCISION AND DRAINAGE     Bartholin cyst  . TONSILLECTOMY AND ADENOIDECTOMY     as a child    There were no vitals filed for this visit.  Subjective Assessment - 02/13/18 1436    Subjective  I was measured for my compression garments today.     Pertinent History  Pt reports she began having swelling over 50 years ago when she was pregnant. She had her first cellulitis infection at that time. Her leg swelling would come and go until the past three to four years it has not gotten any better. Pt was treated for a cellulitis infection in October of 2016. Pt reports she does not have any heart or kidney problems, no family history of lymphedema, pt is in need of knee  replacements and reports her knees buckle occasionally and that is why she uses a cane    Patient Stated Goals  to see if we can get the swelling down so I don't get tired being on my feet    Currently in Pain?  Yes    Pain Score  4     Pain Location  Wrist    Pain Orientation  Right                      [facility] Adult PT Treatment/Exercise - 02/13/18 0001      Manual Therapy   Manual Lymphatic Drainage (MLD)  short neck, 5 diaphragmatic breaths, Rt. axillary nodes and establishment of inguino axillary pathway, Rt. LE working proximal to distal then retracing all steps    Compression Bandaging  to RLE: thick stockinette, artiflex from foot to knee, 1 8 cm bandage at foot and ankle, 2 12 cm bandages from ankle to knee with the last one in herribone pattern, to LLE: cut pt's original foot/ankle velcro piece to fit pt's foot, donned this with her velcro compression garment from ankle to knee changed bandaging technique to allow pt to don her shoe  PT Long Term Goals - 01/23/18 1203      PT LONG TERM GOAL #1   Title  Pt will be able to independently manage her lymphedema through use of compression bandages and appropriate compression garments.    Baseline  01/23/18- pt is reducing in regarding to her LLE and is now wearing a velcro compression garment on this side, she will benefit from bandaging to RLE and obtaining a compression garment to manage the RLE swelling    Time  4    Period  Weeks    Status  On-going      PT LONG TERM GOAL #2   Title  Pt will demonstrate 5 cm reduction of swelling 30 cm proximal to floor on lateral plantar surface of LLE to all improved ambulation    Baseline  57, 01/23/18- 55.5 cm    Time  4    Period  Weeks    Status  On-going      PT LONG TERM GOAL #3   Title  Pt will report increased ease with donning socks and pants due to reduction of swelling in LLE    Time  4    Period  Weeks    Status  On-going             Plan - 02/13/18 1620    Clinical Impression Statement  Pt was measured for new velcro compression garment and should receive it next week. The foot/ankle velcro piece worked will with her current garment so she will continue to use that with the leg garment for management of swelling on one leg. Continued MLD and compression bandaging to RLE until pt receives new compression garment.     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 (her husband passed away 05-10-16)    PT Frequency  3x / week    PT Duration  4 weeks    PT Treatment/Interventions  ADLs/Self Care Home Management;Therapeutic exercise;Manual techniques;Manual lymph drainage;Compression bandaging;Passive range of motion;Taping;Vasopneumatic Device    PT Next Visit Plan    Continue manual lymph drainage and bandaging to right LE, see if pt was measured for garments    Consulted and Agree with Plan of Care  Patient       Patient will benefit from skilled therapeutic intervention in order to improve the following deficits and impairments:  Increased edema, Decreased mobility  Visit Diagnosis: Lymphedema, not elsewhere classified  Difficulty in walking, not elsewhere classified     Problem List Patient Active Problem List   Diagnosis Date Noted  . Heart murmur 08/02/2017  . Diabetes type 2, controlled ([place]) 10/13/2016  . Morbid obesity ([place]) 10/08/2014  . Vasculitis ([place]) 10/08/2014  . Essential hypertension 10/08/2014  . Lymphedema 02/18/2014  . Mixed hyperlipidemia 05/14/2008    Allyson Sabal [facility] 02/13/2018, 4:22 PM  [place], [place], [address] Phone: [phone]   Fax:  [phone]  Name: Alexandra Williams MRN: [id number] Date of Birth: 1941-02-21  Manus Gunning, PT 02/13/18 4:22 PM

## 2018-02-14 DIAGNOSIS — M25531 Pain in right wrist: Secondary | ICD-10-CM | POA: Diagnosis not present

## 2018-02-15 ENCOUNTER — Ambulatory Visit: Payer: Medicare Other | Attending: Vascular Surgery | Admitting: Physical Therapy

## 2018-02-15 ENCOUNTER — Encounter: Payer: Self-pay | Admitting: Physical Therapy

## 2018-02-15 DIAGNOSIS — R262 Difficulty in walking, not elsewhere classified: Secondary | ICD-10-CM

## 2018-02-15 DIAGNOSIS — I890 Lymphedema, not elsewhere classified: Secondary | ICD-10-CM | POA: Diagnosis not present

## 2018-02-15 NOTE — Therapy (Signed)
[place], [place], [address] Phone: [phone]   Fax:  [phone]  Physical Therapy Treatment  Patient Details  Name: AVAYAH RAFFETY MRN: [id number] Date of Birth: June 15, 1941 Referring Provider: Deitra Mayo   Encounter Date: 02/15/2018  PT End of Session - 02/15/18 1204    Visit Number  18    Number of Visits  17    Date for PT Re-Evaluation  02/20/18    PT Start Time  1105    PT Stop Time  1147    PT Time Calculation (min)  42 min    Activity Tolerance  Patient tolerated treatment well    Behavior During Therapy  [facility] for tasks assessed/performed       Past Medical History:  Diagnosis Date  . Abnormal Pap smear 4/82   colpo/ecc negative  . Arthritis    knee  . Cervical polyp 1/99  . Colon polyps   . Diabetes mellitus without complication (HCC)    pre-diabetes  . Elevated lipids   . Fibrocystic breast   . Hives    undetermined-on 7 antihistamines  . Hyperlipidemia   . Hypertension   . Postmenopausal bleeding 3/99   on HRT/endometrial biopsy-focal simple hyperplasia  . Uterine fibroid   . Vasculitis (HCC)    bilat legs  . Vitamin D deficiency     Past Surgical History:  Procedure Laterality Date  . INCISION AND DRAINAGE     Bartholin cyst  . TONSILLECTOMY AND ADENOIDECTOMY     as a child    There were no vitals filed for this visit.  Subjective Assessment - 02/15/18 1107    Subjective  I took my bandages off last night before I got in to bed. They looked like they were down. My right ankle was a little more puffy than the left.     Pertinent History  Pt reports she began having swelling over 50 years ago when she was pregnant. She had her first cellulitis infection at that time. Her leg swelling would come and go until the past three to four years it has not gotten any better. Pt was treated for a cellulitis infection in October of 2016. Pt reports she does not have  any heart or kidney problems, no family history of lymphedema, pt is in need of knee replacements and reports her knees buckle occasionally and that is why she uses a cane    Patient Stated Goals  to see if we can get the swelling down so I don't get tired being on my feet    Currently in Pain?  No/denies    Pain Score  0-No pain                      [facility] Adult PT Treatment/Exercise - 02/15/18 0001      Manual Therapy   Manual Lymphatic Drainage (MLD)  short neck, 5 diaphragmatic breaths, Rt. axillary nodes and establishment of inguino axillary pathway, Rt. LE working proximal to distal then retracing all steps  (Pended)     Compression Bandaging  to RLE: thick stockinette, artiflex from foot to knee, 1 8 cm bandage at foot and ankle, 2 12 cm bandages from ankle to knee with the last one in herribone pattern, to LLE: cut pt's original foot/ankle velcro piece to fit pt's foot, donned this with her velcro compression garment from ankle to knee  (Pended)  changed bandaging technique to allow pt to  don her shoe                  PT Long Term Goals - 01/23/18 1203      PT LONG TERM GOAL #1   Title  Pt will be able to independently manage her lymphedema through use of compression bandages and appropriate compression garments.    Baseline  01/23/18- pt is reducing in regarding to her LLE and is now wearing a velcro compression garment on this side, she will benefit from bandaging to RLE and obtaining a compression garment to manage the RLE swelling    Time  4    Period  Weeks    Status  On-going      PT LONG TERM GOAL #2   Title  Pt will demonstrate 5 cm reduction of swelling 30 cm proximal to floor on lateral plantar surface of LLE to all improved ambulation    Baseline  57, 01/23/18- 55.5 cm    Time  4    Period  Weeks    Status  On-going      PT LONG TERM GOAL #3   Title  Pt will report increased ease with donning socks and pants due to reduction of swelling in LLE     Time  4    Period  Weeks    Status  On-going            Plan - 02/15/18 1205    Clinical Impression Statement  Continued MLD and bandaging to RLE while pt awaits arrival of bandages for long term management of RLE lymphedema. She has been wearing her current velcro compression garment and foot/ankle piece on LLE and it has been managing her swelling.     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 (her husband passed away June 03, 2016)    PT Frequency  3x / week    PT Duration  4 weeks    PT Treatment/Interventions  ADLs/Self Care Home Management;Therapeutic exercise;Manual techniques;Manual lymph drainage;Compression bandaging;Passive range of motion;Taping;Vasopneumatic Device    PT Next Visit Plan    Continue manual lymph drainage and bandaging to right LE, see if pt was measured for garments    Consulted and Agree with Plan of Care  Patient       Patient will benefit from skilled therapeutic intervention in order to improve the following deficits and impairments:  Increased edema, Decreased mobility  Visit Diagnosis: Lymphedema, not elsewhere classified  Difficulty in walking, not elsewhere classified     Problem List Patient Active Problem List   Diagnosis Date Noted  . Heart murmur 08/02/2017  . Diabetes type 2, controlled ([place]) 10/13/2016  . Morbid obesity ([place]) 10/08/2014  . Vasculitis ([place]) 10/08/2014  . Essential hypertension 10/08/2014  . Lymphedema 02/18/2014  . Mixed hyperlipidemia 05/14/2008    Allyson Sabal [facility] 02/15/2018, 12:06 PM  [place], [place], [address] Phone: [phone]   Fax:  [phone]  Name: MAITRI SCHNOEBELEN MRN: [id number] Date of Birth: [birth year]  Manus Gunning, PT 02/15/18 12:07 PM

## 2018-02-21 ENCOUNTER — Ambulatory Visit: Payer: Medicare Other | Admitting: Physical Therapy

## 2018-02-21 ENCOUNTER — Encounter: Payer: Self-pay | Admitting: Physical Therapy

## 2018-02-21 DIAGNOSIS — R262 Difficulty in walking, not elsewhere classified: Secondary | ICD-10-CM | POA: Diagnosis not present

## 2018-02-21 DIAGNOSIS — I890 Lymphedema, not elsewhere classified: Secondary | ICD-10-CM | POA: Diagnosis not present

## 2018-02-21 NOTE — Therapy (Signed)
[place], [place], [address] Phone: [phone]   Fax:  [phone]  Physical Therapy Treatment  Patient Details  Name: Alexandra Williams MRN: [id number] Date of Birth: 06-29-1941 Referring Provider: Deitra Mayo   Encounter Date: 02/21/2018  PT End of Session - 02/21/18 1710    Visit Number  19    Number of Visits  29    Date for PT Re-Evaluation  03/21/18    PT Start Time  4268    PT Stop Time  1524    PT Time Calculation (min)  49 min    Activity Tolerance  Patient tolerated treatment well    Behavior During Therapy  [facility] for tasks assessed/performed       Past Medical History:  Diagnosis Date  . Abnormal Pap smear 4/82   colpo/ecc negative  . Arthritis    knee  . Cervical polyp 1/99  . Colon polyps   . Diabetes mellitus without complication (HCC)    pre-diabetes  . Elevated lipids   . Fibrocystic breast   . Hives    undetermined-on 7 antihistamines  . Hyperlipidemia   . Hypertension   . Postmenopausal bleeding 3/99   on HRT/endometrial biopsy-focal simple hyperplasia  . Uterine fibroid   . Vasculitis (HCC)    bilat legs  . Vitamin D deficiency     Past Surgical History:  Procedure Laterality Date  . INCISION AND DRAINAGE     Bartholin cyst  . TONSILLECTOMY AND ADENOIDECTOMY     as a child    There were no vitals filed for this visit.  Subjective Assessment - 02/21/18 1437    Subjective  My knees are hurting. It has to be this weather. I woke up hurting.     Pertinent History  Pt reports she began having swelling over 50 years ago when she was pregnant. She had her first cellulitis infection at that time. Her leg swelling would come and go until the past three to four years it has not gotten any better. Pt was treated for a cellulitis infection in October of 2016. Pt reports she does not have any heart or kidney problems, no family history of lymphedema, pt is in need of  knee replacements and reports her knees buckle occasionally and that is why she uses a cane    Patient Stated Goals  to see if we can get the swelling down so I don't get tired being on my feet    Currently in Pain?  Yes    Pain Score  8     Pain Location  Knee    Pain Orientation  Right;Left    Pain Descriptors / Indicators  Aching                      [facility] Adult PT Treatment/Exercise - 02/21/18 0001      Manual Therapy   Manual Lymphatic Drainage (MLD)  short neck, 5 diaphragmatic breaths, Rt. axillary nodes and establishment of inguino axillary pathway, Rt. LE working proximal to distal then retracing all steps    Compression Bandaging  to RLE: thick stockinette, artiflex from foot to knee, 1 8 cm bandage at foot and ankle, 2 12 cm bandages from ankle to knee with the last one in herribone pattern, to LLE: cut pt's original foot/ankle velcro piece to fit pt's foot, donned this with her velcro compression garment from ankle to knee changed bandaging technique to allow pt  to don her shoe                  PT Long Term Goals - 01/23/18 1203      PT LONG TERM GOAL #1   Title  Pt will be able to independently manage her lymphedema through use of compression bandages and appropriate compression garments.    Baseline  01/23/18- pt is reducing in regarding to her LLE and is now wearing a velcro compression garment on this side, she will benefit from bandaging to RLE and obtaining a compression garment to manage the RLE swelling    Time  4    Period  Weeks    Status  On-going      PT LONG TERM GOAL #2   Title  Pt will demonstrate 5 cm reduction of swelling 30 cm proximal to floor on lateral plantar surface of LLE to all improved ambulation    Baseline  57, 01/23/18- 55.5 cm    Time  4    Period  Weeks    Status  On-going      PT LONG TERM GOAL #3   Title  Pt will report increased ease with donning socks and pants due to reduction of swelling in LLE    Time  4     Period  Weeks    Status  On-going            Plan - 02/21/18 1711    Clinical Impression Statement  Pt's compression garments have not yet arrived. Continued with MLD and bandaging to RLE. Pt reports when she tries to bandage her RLE she is unable to get the bandages tight enough to control her swelling. She reports her LLE is being well controlled with her velcro compression leg and foot piece. Pt would benefit from additional skilled PT visits to manage her RLE lymphedema unitl she recieves her compression garments at which point she will be able to independently manage her swelling.     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 (her husband passed away 05-08-2016)    PT Frequency  3x / week    PT Duration  4 weeks    PT Treatment/Interventions  ADLs/Self Care Home Management;Therapeutic exercise;Manual techniques;Manual lymph drainage;Compression bandaging;Passive range of motion;Taping;Vasopneumatic Device    PT Next Visit Plan    Continue manual lymph drainage and bandaging to right LE, see if pt was measured for garments    Consulted and Agree with Plan of Care  Patient       Patient will benefit from skilled therapeutic intervention in order to improve the following deficits and impairments:  Increased edema, Decreased mobility  Visit Diagnosis: Lymphedema, not elsewhere classified - Plan: PT plan of care cert/re-cert  Difficulty in walking, not elsewhere classified - Plan: PT plan of care cert/re-cert     Problem List Patient Active Problem List   Diagnosis Date Noted  . Heart murmur 08/02/2017  . Diabetes type 2, controlled ([place]) 10/13/2016  . Morbid obesity ([place]) 10/08/2014  . Vasculitis ([place]) 10/08/2014  . Essential hypertension 10/08/2014  . Lymphedema 02/18/2014  . Mixed hyperlipidemia 05/14/2008    Allyson Sabal [facility] 02/21/2018, 5:14 PM  [place], [place], [address] Phone: [phone]   Fax:  [phone]  Name: Alexandra Williams MRN: [id number] Date of Birth: 19-Mar-1941  Hilaria Ota  [address], PT 02/21/18 5:14 PM

## 2018-02-22 ENCOUNTER — Ambulatory Visit
Admission: RE | Admit: 2018-02-22 | Discharge: 2018-02-22 | Disposition: A | Discharge status: Home / Self Care | Payer: Medicare Other | Source: Ambulatory Visit | Attending: Obstetrics & Gynecology | Admitting: Obstetrics & Gynecology

## 2018-02-22 DIAGNOSIS — M25531 Pain in right wrist: Secondary | ICD-10-CM | POA: Diagnosis not present

## 2018-02-22 DIAGNOSIS — Z1231 Encounter for screening mammogram for malignant neoplasm of breast: Secondary | ICD-10-CM | POA: Diagnosis not present

## 2018-02-22 DIAGNOSIS — S6981XD Other specified injuries of right wrist, hand and finger(s), subsequent encounter: Secondary | ICD-10-CM | POA: Diagnosis not present

## 2018-02-22 DIAGNOSIS — M6520 Calcific tendinitis, unspecified site: Secondary | ICD-10-CM | POA: Diagnosis not present

## 2018-02-26 ENCOUNTER — Ambulatory Visit: Payer: Medicare Other | Admitting: Obstetrics & Gynecology

## 2018-03-04 ENCOUNTER — Encounter: Payer: Medicare Other | Admitting: Physical Therapy

## 2018-03-04 DIAGNOSIS — Z6841 Body Mass Index (BMI) 40.0 and over, adult: Secondary | ICD-10-CM | POA: Diagnosis not present

## 2018-03-04 DIAGNOSIS — L501 Idiopathic urticaria: Secondary | ICD-10-CM | POA: Diagnosis not present

## 2018-03-04 DIAGNOSIS — T783XXD Angioneurotic edema, subsequent encounter: Secondary | ICD-10-CM | POA: Diagnosis not present

## 2018-03-05 ENCOUNTER — Ambulatory Visit: Payer: Medicare Other

## 2018-03-08 ENCOUNTER — Encounter: Payer: Self-pay | Admitting: Physical Therapy

## 2018-03-08 ENCOUNTER — Ambulatory Visit: Payer: Medicare Other | Admitting: Physical Therapy

## 2018-03-08 DIAGNOSIS — I890 Lymphedema, not elsewhere classified: Secondary | ICD-10-CM

## 2018-03-08 DIAGNOSIS — R262 Difficulty in walking, not elsewhere classified: Secondary | ICD-10-CM

## 2018-03-08 NOTE — Therapy (Addendum)
Progress Note Reporting Period 01/25/18 to 03/08/18  See note below for Objective Data and Assessment of Progress/Goals.       Alexandra Williams, [place], [address] Phone: [phone]   Fax:  [phone]  Physical Therapy Treatment  Patient Details  Name: Alexandra Williams MRN: [id number] Date of Birth: [birth year] Referring Provider: Deitra Mayo   Encounter Date: 03/08/2018  PT End of Session - 03/08/18 1113    Visit Number  20    Number of Visits  29    Date for PT Re-Evaluation  03/21/18    PT Start Time  1022    PT Stop Time  1103    PT Time Calculation (min)  41 min    Activity Tolerance  Patient tolerated treatment well    Behavior During Therapy  [facility] for tasks assessed/performed       Past Medical History:  Diagnosis Date  . Abnormal Pap smear 4/82   colpo/ecc negative  . Arthritis    knee  . Cervical polyp 1/99  . Colon polyps   . Diabetes mellitus without complication (HCC)    pre-diabetes  . Elevated lipids   . Fibrocystic breast   . Hives    undetermined-on 7 antihistamines  . Hyperlipidemia   . Hypertension   . Postmenopausal bleeding 3/99   on HRT/endometrial biopsy-focal simple hyperplasia  . Uterine fibroid   . Vasculitis (HCC)    bilat legs  . Vitamin D deficiency     Past Surgical History:  Procedure Laterality Date  . INCISION AND DRAINAGE     Bartholin cyst  . TONSILLECTOMY AND ADENOIDECTOMY     as a child    There were no vitals filed for this visit.  Subjective Assessment - 03/08/18 1026    Subjective  I have not gotten my garments yet. I am wearing my compression stockings today. They don't give the compression that the wrapping does.     Pertinent History  Pt reports she began having swelling over 50 years ago when she was pregnant. She had her first cellulitis infection at that time. Her leg swelling would come and go until the past three to four  years it has not gotten any better. Pt was treated for a cellulitis infection in October of 2016. Pt reports she does not have any heart or kidney problems, no family history of lymphedema, pt is in need of knee replacements and reports her knees buckle occasionally and that is why she uses a cane    Patient Stated Goals  to see if we can get the swelling down so I don't get tired being on my feet    Currently in Pain?  No/denies    Pain Score  0-No pain                No data recorded       [facility] Adult PT Treatment/Exercise - 03/08/18 0001      Manual Therapy   Manual Lymphatic Drainage (MLD)  short neck, superficial and deep abdominals, Rt. axillary nodes and establishment of inguino axillary pathway, Rt. LE working proximal to distal then retracing all steps    Compression Bandaging  to RLE: thick stockinette, artiflex from foot to knee, 1 8 cm bandage at foot and ankle, 2 12 cm bandages from ankle to knee with the last one in herribone pattern, to LLE: pt had her circular knit compression stocking donned changed bandaging technique to allow  pt to don her shoe                  PT Long Term Goals - 01/23/18 1203      PT LONG TERM GOAL #1   Title  Pt will be able to independently manage her lymphedema through use of compression bandages and appropriate compression garments.    Baseline  01/23/18- pt is reducing in regarding to her LLE and is now wearing a velcro compression garment on this side, she will benefit from bandaging to RLE and obtaining a compression garment to manage the RLE swelling    Time  4    Period  Weeks    Status  On-going      PT LONG TERM GOAL #2   Title  Pt will demonstrate 5 cm reduction of swelling 30 cm proximal to floor on lateral plantar surface of LLE to all improved ambulation    Baseline  57, 01/23/18- 55.5 cm    Time  4    Period  Weeks    Status  On-going      PT LONG TERM GOAL #3   Title  Pt will report increased ease with  donning socks and pants due to reduction of swelling in LLE    Time  4    Period  Weeks    Status  On-going            Plan - 03/08/18 1113    Clinical Impression Statement  Pt's velcro compression garments have arrived at DME provider but pt has not had time to pick them up due to car trouble. She plans on picking them up today. Continued with MLD to RLE and compression bandaging. Pt was wearing her circular knit stocking on her left lower extremity. Previously she had been unable to tolerate them due to hives on her skin but states she has been tolerating it well.     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 (her husband passed away 05/15/16)    PT Frequency  3x / week    PT Duration  4 weeks    PT Treatment/Interventions  ADLs/Self Care Home Management;Therapeutic exercise;Manual techniques;Manual lymph drainage;Compression bandaging;Passive range of motion;Taping;Vasopneumatic Device    PT Next Visit Plan    Continue manual lymph drainage and bandaging to right LE, see if pt received garment    Consulted and Agree with Plan of Care  Patient       Patient will benefit from skilled therapeutic intervention in order to improve the following deficits and impairments:  Increased edema, Decreased mobility  Visit Diagnosis: Lymphedema, not elsewhere classified  Difficulty in walking, not elsewhere classified     Problem List Patient Active Problem List   Diagnosis Date Noted  . Heart murmur 08/02/2017  . Diabetes type 2, controlled ([place]) 10/13/2016  . Morbid obesity ([place]) 10/08/2014  . Vasculitis ([place]) 10/08/2014  . Essential hypertension 10/08/2014  . Lymphedema 02/18/2014  . Mixed hyperlipidemia 05/14/2008    Allyson Sabal [facility] 03/08/2018, 11:16 AM  [place], [place], [address] Phone:  [phone]   Fax:  [phone]  Name: Alexandra Williams MRN: [id number] Date of Birth: 1941-01-28  Manus Gunning, PT 03/08/18 11:16 AM

## 2018-03-13 ENCOUNTER — Encounter: Payer: Self-pay | Admitting: Physical Therapy

## 2018-03-13 ENCOUNTER — Ambulatory Visit: Payer: Medicare Other | Admitting: Physical Therapy

## 2018-03-13 DIAGNOSIS — I890 Lymphedema, not elsewhere classified: Secondary | ICD-10-CM

## 2018-03-13 DIAGNOSIS — R262 Difficulty in walking, not elsewhere classified: Secondary | ICD-10-CM | POA: Diagnosis not present

## 2018-03-13 NOTE — Therapy (Signed)
[place], [place], [address] Phone: [phone]   Fax:  [phone]  Physical Therapy Treatment  Patient Details  Name: Alexandra Williams MRN: [id number] Date of Birth: 10/03/1941 Referring Provider: Deitra Mayo   Encounter Date: 03/13/2018  PT End of Session - 03/13/18 1542    Visit Number  21 add kx    Number of Visits  29    Date for PT Re-Evaluation  03/21/18    PT Start Time  4854    PT Stop Time  1525    PT Time Calculation (min)  46 min    Activity Tolerance  Patient tolerated treatment well    Behavior During Therapy  [facility] for tasks assessed/performed       Past Medical History:  Diagnosis Date  . Abnormal Pap smear 4/82   colpo/ecc negative  . Arthritis    knee  . Cervical polyp 1/99  . Colon polyps   . Diabetes mellitus without complication (HCC)    pre-diabetes  . Elevated lipids   . Fibrocystic breast   . Hives    undetermined-on 7 antihistamines  . Hyperlipidemia   . Hypertension   . Postmenopausal bleeding 3/99   on HRT/endometrial biopsy-focal simple hyperplasia  . Uterine fibroid   . Vasculitis (HCC)    bilat legs  . Vitamin D deficiency     Past Surgical History:  Procedure Laterality Date  . INCISION AND DRAINAGE     Bartholin cyst  . TONSILLECTOMY AND ADENOIDECTOMY     as a child    There were no vitals filed for this visit.  Subjective Assessment - 03/13/18 1440    Subjective  I picked up my velcro compression garments yesterday. I have been wearing my compression stockings.     Pertinent History  Pt reports she began having swelling over 50 years ago when she was pregnant. She had her first cellulitis infection at that time. Her leg swelling would come and go until the past three to four years it has not gotten any better. Pt was treated for a cellulitis infection in October of 2016. Pt reports she does not have any heart or kidney problems, no family  history of lymphedema, pt is in need of knee replacements and reports her knees buckle occasionally and that is why she uses a cane    Patient Stated Goals  to see if we can get the swelling down so I don't get tired being on my feet    Currently in Pain?  No/denies    Pain Score  0-No pain            LYMPHEDEMA/ONCOLOGY QUESTIONNAIRE - 03/13/18 1442      Right Lower Extremity Lymphedema   30 cm Proximal to Floor at Lateral Plantar Foot  55 cm  (Pended)     20 cm Proximal to Floor at Lateral Plantar Foot  49 1  (Pended)     10 cm Proximal to Floor at Lateral Malleoli  36.8 cm  (Pended)     5 cm Proximal to 1st MTP Joint  22.7 cm  (Pended)     Across MTP Joint  22.5 cm  (Pended)     Around Proximal Great Toe  8 cm  (Pended)       Left Lower Extremity Lymphedema   30 cm Proximal to Floor at Lateral Plantar Foot  56 cm  (Pended)     20 cm Proximal to Floor at  Lateral Plantar Foot  48.3 cm  (Pended)     10 cm Proximal to Floor at Lateral Malleoli  36 cm  (Pended)     5 cm Proximal to 1st MTP Joint  22.2 cm  (Pended)     Across MTP Joint  22.5 cm  (Pended)     Around Proximal Great Toe  8 cm  (Pended)          No data recorded       [facility] Adult PT Treatment/Exercise - 03/13/18 0001      Manual Therapy   Manual Lymphatic Drainage (MLD)  short neck, superficial and deep abdominals, Rt. axillary nodes and establishment of inguino axillary pathway, Rt. LE working proximal to distal then retracing all steps    Compression Bandaging  donned pt's velcro compression garments to bilateral LEs and educated pt to wear these instead of her compression stockings for increased compression                  PT Long Term Goals - 01/23/18 1203      PT LONG TERM GOAL #1   Title  Pt will be able to independently manage her lymphedema through use of compression bandages and appropriate compression garments.    Baseline  01/23/18- pt is reducing in regarding to her LLE and is now  wearing a velcro compression garment on this side, she will benefit from bandaging to RLE and obtaining a compression garment to manage the RLE swelling    Time  4    Period  Weeks    Status  On-going      PT LONG TERM GOAL #2   Title  Pt will demonstrate 5 cm reduction of swelling 30 cm proximal to floor on lateral plantar surface of LLE to all improved ambulation    Baseline  57, 01/23/18- 55.5 cm    Time  4    Period  Weeks    Status  On-going      PT LONG TERM GOAL #3   Title  Pt will report increased ease with donning socks and pants due to reduction of swelling in LLE    Time  4    Period  Weeks    Status  On-going            Plan - 03/13/18 1543    Clinical Impression Statement  Patient received her new velcro compression garment for her LLE. Assisted pt with donning this today. Fit was perfect. Donned her other velcro compression garment to her RLE. Pt was able to wear her regular shoes with velcro compression foot pieces. Pt was educated to wear the velcro compression garments instead of the compression stockings for more compression. Circumferential measurements taken today demonstrate increase of edema in bilateral LEs most likely because pt has been on her feet today and did not have on any compression. Will measure next week and assess pt for independence with new garments. She should be ready for discharge at that time.     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 (her husband passed away May 16, 2016)    PT Frequency  3x / week    PT Duration  4 weeks    PT Treatment/Interventions  ADLs/Self Care Home Management;Therapeutic exercise;Manual techniques;Manual lymph drainage;Compression bandaging;Passive range of motion;Taping;Vasopneumatic Device    PT Next Visit Plan   remeasure, d/c?,  Continue manual lymph drainage and bandaging to right LE, see if pt received garment     Consulted and Agree with Plan of Care  Patient       Patient will benefit from skilled therapeutic intervention in order to improve the following deficits and impairments:  Increased edema, Decreased mobility  Visit Diagnosis: Lymphedema, not elsewhere classified  Difficulty in walking, not elsewhere classified     Problem List Patient Active Problem List   Diagnosis Date Noted  . Heart murmur 08/02/2017  . Diabetes type 2, controlled ([place]) 10/13/2016  . Morbid obesity ([place]) 10/08/2014  . Vasculitis ([place]) 10/08/2014  . Essential hypertension 10/08/2014  . Lymphedema 02/18/2014  . Mixed hyperlipidemia 05/14/2008    Allyson Sabal [facility] 03/13/2018, 3:47 PM  [place], [place], [address] Phone: [phone]   Fax:  [phone]  Name: Alexandra Williams MRN: [id number] Date of Birth: 1941/10/10  Manus Gunning, PT 03/13/18 3:47 PM

## 2018-03-18 DIAGNOSIS — L501 Idiopathic urticaria: Secondary | ICD-10-CM | POA: Diagnosis not present

## 2018-03-22 ENCOUNTER — Ambulatory Visit: Payer: Medicare Other | Attending: Vascular Surgery | Admitting: Physical Therapy

## 2018-03-22 ENCOUNTER — Encounter: Payer: Self-pay | Admitting: Physical Therapy

## 2018-03-22 DIAGNOSIS — R262 Difficulty in walking, not elsewhere classified: Secondary | ICD-10-CM | POA: Insufficient documentation

## 2018-03-22 DIAGNOSIS — I890 Lymphedema, not elsewhere classified: Secondary | ICD-10-CM | POA: Diagnosis not present

## 2018-03-22 NOTE — Therapy (Signed)
[place], [place], [address] Phone: [phone]   Fax:  [phone]  Physical Therapy Treatment  Patient Details  Name: Alexandra Williams MRN: [id number] Date of Birth: 06-27-1941 Referring Provider: Deitra Mayo   Encounter Date: 03/22/2018  PT End of Session - 03/22/18 0936    Visit Number  22 add kx    Number of Visits  29    Date for PT Re-Evaluation  03/21/18    PT Start Time  7793 pt arrived late    PT Stop Time  0935    PT Time Calculation (min)  38 min    Activity Tolerance  Patient tolerated treatment well    Behavior During Therapy  [facility] for tasks assessed/performed       Past Medical History:  Diagnosis Date  . Abnormal Pap smear 4/82   colpo/ecc negative  . Arthritis    knee  . Cervical polyp 1/99  . Colon polyps   . Diabetes mellitus without complication (HCC)    pre-diabetes  . Elevated lipids   . Fibrocystic breast   . Hives    undetermined-on 7 antihistamines  . Hyperlipidemia   . Hypertension   . Postmenopausal bleeding 3/99   on HRT/endometrial biopsy-focal simple hyperplasia  . Uterine fibroid   . Vasculitis (HCC)    bilat legs  . Vitamin D deficiency     Past Surgical History:  Procedure Laterality Date  . INCISION AND DRAINAGE     Bartholin cyst  . TONSILLECTOMY AND ADENOIDECTOMY     as a child    There were no vitals filed for this visit.  Subjective Assessment - 03/22/18 0859    Subjective  The garments have been going well. I didn't wear them this morning because I was coming over here.     Pertinent History  Pt reports she began having swelling over 50 years ago when she was pregnant. She had her first cellulitis infection at that time. Her leg swelling would come and go until the past three to four years it has not gotten any better. Pt was treated for a cellulitis infection in October of 2016. Pt reports she does not have any heart or kidney  problems, no family history of lymphedema, pt is in need of knee replacements and reports her knees buckle occasionally and that is why she uses a cane    Patient Stated Goals  to see if we can get the swelling down so I don't get tired being on my feet    Currently in Pain?  No/denies    Pain Score  0-No pain            LYMPHEDEMA/ONCOLOGY QUESTIONNAIRE - 03/22/18 0913      Type   Cancer Type                        [facility] Adult PT Treatment/Exercise - 03/22/18 0001      Manual Therapy   Manual Lymphatic Drainage (MLD)  short neck, 5 diaphragmatic breaths, Left. axillary nodes and establishment of inguino axillary pathway, Left. LE working proximal to distal then retracing all steps    Compression Bandaging  donned pt's velcro garments to bilateral LEs                  PT Long Term Goals - 03/22/18 0903      PT LONG TERM GOAL #1   Title  Pt will  be able to independently manage her lymphedema through use of compression bandages and appropriate compression garments.    Baseline  01/23/18- pt is reducing in regarding to her LLE and is now wearing a velcro compression garment on this side, she will benefit from bandaging to RLE and obtaining a compression garment to manage the RLE swelling, 03/22/18- pt reports she is able to independently manage her swelling with her compression garments    Time  4    Period  Weeks    Status  Achieved      PT LONG TERM GOAL #2   Title  Pt will demonstrate 5 cm reduction of swelling 30 cm proximal to floor on lateral plantar surface of LLE to all improved ambulation    Baseline  57, 01/23/18- 55.5 cm, 52.5cm    Time  4    Period  Weeks    Status  Achieved      PT LONG TERM GOAL #3   Title  Pt will report increased ease with donning socks and pants due to reduction of swelling in LLE    Baseline  03/22/18- pt reports increased ease with this    Time  4    Period  Weeks    Status  Achieved            Plan - 03/22/18 1033     Clinical Impression Statement  Assessed pt's progress towards goals in therapy. She has now met all goals for therapy. She demonstrates excellent reduction of bilateral LE lymphedema and is now able to manage her lymphedema with her bilateral velcro compression garments and ankle pieces. Pt to be discharged from skilled PT services at this time.     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 (her husband passed away 05-30-2016)    PT Frequency  3x / week    PT Duration  4 weeks    PT Treatment/Interventions  ADLs/Self Care Home Management;Therapeutic exercise;Manual techniques;Manual lymph drainage;Compression bandaging;Passive range of motion;Taping;Vasopneumatic Device    PT Next Visit Plan  dc this vist    Consulted and Agree with Plan of Care  Patient       Patient will benefit from skilled therapeutic intervention in order to improve the following deficits and impairments:  Increased edema, Decreased mobility  Visit Diagnosis: Lymphedema, not elsewhere classified  Difficulty in walking, not elsewhere classified     Problem List Patient Active Problem List   Diagnosis Date Noted  . Heart murmur 08/02/2017  . Diabetes type 2, controlled ([place]) 10/13/2016  . Morbid obesity ([place]) 10/08/2014  . Vasculitis ([place]) 10/08/2014  . Essential hypertension 10/08/2014  . Lymphedema 02/18/2014  . Mixed hyperlipidemia 05/14/2008    Allyson Sabal [facility] 03/22/2018, 10:34 AM  [place], [place], [address] Phone: [phone]   Fax:  [phone]  Name: Alexandra Williams MRN: [id number] Date of Birth: 12-10-1941  PHYSICAL THERAPY DISCHARGE SUMMARY  Visits from Start of Care: 22  Current functional level related to goals / functional outcomes: Pt has met all goals   Remaining deficits: None   Education /  Equipment: Compression garments for bilateral LEs  Plan: Patient agrees to discharge.  Patient goals were met. Patient is being discharged due to meeting the stated rehab goals.  ?????    [facility] [place], [place] 03/22/18 10:35 AM

## 2018-04-01 DIAGNOSIS — L501 Idiopathic urticaria: Secondary | ICD-10-CM | POA: Diagnosis not present

## 2018-04-09 DIAGNOSIS — S6981XD Other specified injuries of right wrist, hand and finger(s), subsequent encounter: Secondary | ICD-10-CM | POA: Diagnosis not present

## 2018-04-16 ENCOUNTER — Other Ambulatory Visit: Payer: Self-pay | Admitting: Obstetrics & Gynecology

## 2018-04-16 DIAGNOSIS — L501 Idiopathic urticaria: Secondary | ICD-10-CM | POA: Diagnosis not present

## 2018-04-16 NOTE — Telephone Encounter (Signed)
Patient called regarding her refill request of zolpidem. Patient said the directions are incorrect and need to changed from take 4 times per day to 1 time per day.

## 2018-04-16 NOTE — Telephone Encounter (Signed)
Spoke with Alexandra Williams at [facility] who states that the error in directions was from the pharmacy not our office. Reports the rx they received from our office was to take 1 tablet daily at bedtime. Alexandra Williams has spoken with the patient and notified her this was a pharmacy error. Alexandra Williams ensured the patient is taking 1 tablet daily at bedtime.  Routing to provider for final review. Patient agreeable to disposition. Will close encounter.

## 2018-04-18 ENCOUNTER — Other Ambulatory Visit: Payer: Self-pay | Admitting: Obstetrics & Gynecology

## 2018-04-18 NOTE — Telephone Encounter (Signed)
Medication refill request: Ambien 5mg  #30, 0Refill Last AEX:  11-29-17 Next AEX: 02-11-19 Last MMG (if hormonal medication request): 02-25-18 [id number] Refill authorized: Please advise

## 2018-04-18 NOTE — Telephone Encounter (Signed)
Patient calling to check status of her request for ambien.

## 2018-04-18 NOTE — Telephone Encounter (Signed)
[facility] pharmacy is calling for the status of this request. The patient told the pharmacy she talked with a nurse yesterday and the prescription was supposed to be called in yesterday.

## 2018-04-18 NOTE — Telephone Encounter (Signed)
Medication refill request: Ambien  Last AEX:  11-29-17 Next AEX: 02-11-19  Last MMG (if hormonal medication request): 02-23-28 WNL  Refill authorized: please advise

## 2018-04-19 NOTE — Telephone Encounter (Signed)
Refill has been done.

## 2018-04-30 DIAGNOSIS — L501 Idiopathic urticaria: Secondary | ICD-10-CM | POA: Diagnosis not present

## 2018-05-14 DIAGNOSIS — L501 Idiopathic urticaria: Secondary | ICD-10-CM | POA: Diagnosis not present

## 2018-05-14 DIAGNOSIS — H1013 Acute atopic conjunctivitis, bilateral: Secondary | ICD-10-CM | POA: Diagnosis not present

## 2018-05-28 DIAGNOSIS — L501 Idiopathic urticaria: Secondary | ICD-10-CM | POA: Diagnosis not present

## 2018-06-03 DIAGNOSIS — Z0000 Encounter for general adult medical examination without abnormal findings: Secondary | ICD-10-CM | POA: Diagnosis not present

## 2018-06-03 DIAGNOSIS — L501 Idiopathic urticaria: Secondary | ICD-10-CM | POA: Diagnosis not present

## 2018-06-03 DIAGNOSIS — E119 Type 2 diabetes mellitus without complications: Secondary | ICD-10-CM | POA: Diagnosis not present

## 2018-06-03 DIAGNOSIS — E7800 Pure hypercholesterolemia, unspecified: Secondary | ICD-10-CM | POA: Diagnosis not present

## 2018-06-03 DIAGNOSIS — I10 Essential (primary) hypertension: Secondary | ICD-10-CM | POA: Diagnosis not present

## 2018-06-03 DIAGNOSIS — I890 Lymphedema, not elsewhere classified: Secondary | ICD-10-CM | POA: Diagnosis not present

## 2018-06-04 DIAGNOSIS — L501 Idiopathic urticaria: Secondary | ICD-10-CM | POA: Diagnosis not present

## 2018-06-10 DIAGNOSIS — L501 Idiopathic urticaria: Secondary | ICD-10-CM | POA: Diagnosis not present

## 2018-07-02 DIAGNOSIS — L501 Idiopathic urticaria: Secondary | ICD-10-CM | POA: Diagnosis not present

## 2018-07-03 DIAGNOSIS — L923 Foreign body granuloma of the skin and subcutaneous tissue: Secondary | ICD-10-CM | POA: Diagnosis not present

## 2018-07-03 DIAGNOSIS — L509 Urticaria, unspecified: Secondary | ICD-10-CM | POA: Diagnosis not present

## 2018-07-03 DIAGNOSIS — D229 Melanocytic nevi, unspecified: Secondary | ICD-10-CM | POA: Diagnosis not present

## 2018-07-17 DIAGNOSIS — M170 Bilateral primary osteoarthritis of knee: Secondary | ICD-10-CM | POA: Diagnosis not present

## 2018-07-17 DIAGNOSIS — M1712 Unilateral primary osteoarthritis, left knee: Secondary | ICD-10-CM | POA: Insufficient documentation

## 2018-07-23 DIAGNOSIS — L501 Idiopathic urticaria: Secondary | ICD-10-CM | POA: Diagnosis not present

## 2018-07-25 DIAGNOSIS — M1711 Unilateral primary osteoarthritis, right knee: Secondary | ICD-10-CM | POA: Diagnosis not present

## 2018-07-25 DIAGNOSIS — M1712 Unilateral primary osteoarthritis, left knee: Secondary | ICD-10-CM | POA: Diagnosis not present

## 2018-08-01 DIAGNOSIS — D239 Other benign neoplasm of skin, unspecified: Secondary | ICD-10-CM | POA: Diagnosis not present

## 2018-08-01 DIAGNOSIS — L821 Other seborrheic keratosis: Secondary | ICD-10-CM | POA: Diagnosis not present

## 2018-08-02 DIAGNOSIS — M170 Bilateral primary osteoarthritis of knee: Secondary | ICD-10-CM | POA: Diagnosis not present

## 2018-08-07 ENCOUNTER — Other Ambulatory Visit: Payer: Self-pay | Admitting: Interventional Cardiology

## 2018-08-09 ENCOUNTER — Other Ambulatory Visit: Payer: Self-pay

## 2018-08-09 MED ORDER — AMLODIPINE BESYLATE 10 MG PO TABS: 10.0000 mg | ORAL_TABLET | Freq: Every evening | ORAL | 0 refills | Status: DC

## 2018-08-16 DIAGNOSIS — L501 Idiopathic urticaria: Secondary | ICD-10-CM | POA: Diagnosis not present

## 2018-08-20 DIAGNOSIS — M1711 Unilateral primary osteoarthritis, right knee: Secondary | ICD-10-CM | POA: Insufficient documentation

## 2018-08-20 DIAGNOSIS — M1712 Unilateral primary osteoarthritis, left knee: Secondary | ICD-10-CM | POA: Diagnosis not present

## 2018-09-04 DIAGNOSIS — M1712 Unilateral primary osteoarthritis, left knee: Secondary | ICD-10-CM | POA: Diagnosis not present

## 2018-09-04 DIAGNOSIS — M1711 Unilateral primary osteoarthritis, right knee: Secondary | ICD-10-CM | POA: Diagnosis not present

## 2018-09-09 ENCOUNTER — Other Ambulatory Visit: Payer: Self-pay | Admitting: Interventional Cardiology

## 2018-09-10 DIAGNOSIS — L501 Idiopathic urticaria: Secondary | ICD-10-CM | POA: Diagnosis not present

## 2018-09-11 DIAGNOSIS — Z23 Encounter for immunization: Secondary | ICD-10-CM | POA: Diagnosis not present

## 2018-09-30 NOTE — Progress Notes (Signed)
Cardiology Office Note   Date:  10/01/2018   ID:  Alexandra Williams, DOB August 26, 1941, MRN [id number]  PCP:  Aretta Nip, MD    No chief complaint on file.  HTN/lymphedema  Wt Readings from Last 3 Encounters:  10/01/18 225 lb 9.6 oz (102.3 kg)  11/29/17 223 lb (101.2 kg)  11/14/17 223 lb (101.2 kg)       History of Present Illness: Alexandra Williams is a 77 y.o. female   Who has had difficult to control hypertension. SHe has had hyperlipidemia as well.   Her husband was a patient of mine, but he passed away in 06/02/2016 after developing ESRD.  She has had chronic edema in her LE as well, thought to be lymphedema.   SOme days are worse than others.  She uses an inflation device to help with compression.  SHe uses compression stockings and tries to elevate legs as well.   Since the last visit, she has had fatigue.  She has had some hearing loss.  She thinks it is some of her meds.    She has anxiety at thought that she is responsible for her special needs daughter.  She is looking for a program that she could transition to now.   Past Medical History:  Diagnosis Date  . Abnormal Pap smear 4/82   colpo/ecc negative  . Arthritis    knee  . Cervical polyp 1/99  . Colon polyps   . Diabetes mellitus without complication (HCC)    pre-diabetes  . Elevated lipids   . Fibrocystic breast   . Hives    undetermined-on 7 antihistamines  . Hyperlipidemia   . Hypertension   . Postmenopausal bleeding 3/99   on HRT/endometrial biopsy-focal simple hyperplasia  . Uterine fibroid   . Vasculitis (HCC)    bilat legs  . Vitamin D deficiency     Past Surgical History:  Procedure Laterality Date  . INCISION AND DRAINAGE     Bartholin cyst  . TONSILLECTOMY AND ADENOIDECTOMY     as a child     Current Outpatient Medications  Medication Sig Dispense Refill  . acetaminophen (RA ACETAMINOPHEN) 650 MG CR tablet Take 650 mg by mouth as needed for pain.     Marland Kitchen amLODipine  (NORVASC) 10 MG tablet Take 1 tablet (10 mg total) by mouth every evening. Please keep upcoming appt for future refills. 90 tablet 0  . aspirin 81 MG tablet Take 81 mg by mouth daily.    Marland Kitchen atorvastatin (LIPITOR) 40 MG tablet Take 1 tablet (40 mg total) by mouth daily. Please keep upcoming appt in October with Dr. Irish Lack for future refills. Thank you 90 tablet 0  . EPIPEN 2-PAK 0.3 MG/0.3ML SOAJ Inject 0.3 mg into the muscle as needed (HIVES).     . hydroxychloroquine (PLAQUENIL) 200 MG tablet Take 200 mg by mouth 2 (two) times daily.    Marland Kitchen loratadine (CLARITIN) 10 MG tablet Take 1 tablet by mouth every evening.    Marland Kitchen losartan (COZAAR) 100 MG tablet Take 100 mg by mouth daily.    . metFORMIN (GLUCOPHAGE-XR) 500 MG 24 hr tablet Take 500 mg by mouth daily.  0  . metoprolol tartrate (LOPRESSOR) 50 MG tablet Take 50 mg by mouth daily.    . Omalizumab (XOLAIR [place]) Inject into the skin every 14 (fourteen) days.     [name] DELICA LANCETS 42P MISC   1  . ONETOUCH VERIO test strip   1  .  zolpidem (AMBIEN) 5 MG tablet TAKE 1 TABLET BY MOUTH EVERY DAY AT BEDTIME 30 tablet 5   No current facility-administered medications for this visit.     Allergies:   Codeine; Peanuts [peanut oil]; Shellfish allergy; Sulfites; Tessalon [benzonatate]; Hylan g-f 20; and Sulfur    Social History:  The patient  reports that she has never smoked. She has never used smokeless tobacco. She reports that she does not drink alcohol or use drugs.   Family History:  The patient's family history includes Cancer in her father, mother, and paternal aunt; Diabetes in her mother; Heart attack in her father; Heart disease in her father; Hyperlipidemia in her mother; Hypertension in her father and mother; Kidney disease in her father; Mental retardation in her daughter; Pancreatic cancer in her mother; Ulcers in her father.    ROS:  Please see the history of present illness.   Otherwise, review of systems are positive for fatigue,  anxiety.   All other systems are reviewed and negative.    PHYSICAL EXAM: VS:  BP 138/66   Pulse (!) 53   Ht 4\' 11"  (1.499 m)   Wt 225 lb 9.6 oz (102.3 kg)   LMP 12/18/2000   SpO2 93%   BMI 45.57 kg/m  , BMI Body mass index is 45.57 kg/m. GEN: Well nourished, well developed, in no acute distress  HEENT: normal  Neck: no JVD, carotid bruits, or masses Cardiac: RRR; 2/6 systolic murmur, no rubs, or gallops,; bilateral leg edema - better than before Respiratory:  clear to auscultation bilaterally, normal work of breathing GI: soft, nontender, nondistended, + BS MS: no deformity or atrophy  Skin: warm and dry, no rash Neuro:  Strength and sensation are intact Psych: euthymic mood, full affect   EKG:   The ekg ordered today demonstrates NSR, no ST changes   Recent Labs: No results found for requested labs within last 8760 hours.   Lipid Panel No results found for: CHOL, TRIG, HDL, CHOLHDL, VLDL, LDLCALC, LDLDIRECT   Other studies Reviewed: Additional studies/ records that were reviewed today with results demonstrating: LDL 64 in 6/19. Echo in 2018 showed normal LV function and normal valve function   ASSESSMENT AND PLAN:  1. Hypertension: The current medical regimen is effective;  continue present plan and medications. 2. Lymphedema: Stable.Follows with Dr. Scot Dock.  SHe also gets therapy- outpatient rehab.   3. Hyperlipidemia: The current medical regimen is effective;  continue present plan and medications.  LDL 64. 4. DM: Continue to avoid excess sugar in diet.  A1C 6.3.   Current medicines are reviewed at length with the patient today.  The patient concerns regarding her medicines were addressed.  The following changes have been made:  No change  Labs/ tests ordered today include:  No orders of the defined types were placed in this encounter.   Recommend 150 minutes/week of aerobic exercise Low fat, low carb, high fiber diet recommended  Disposition:   FU in 1  year   Signed, Larae Grooms, MD  10/01/2018 9:55 AM    [facility] [address], [place], [place]  [address] Phone: [phone]; Fax: [phone]

## 2018-10-01 ENCOUNTER — Ambulatory Visit (INDEPENDENT_AMBULATORY_CARE_PROVIDER_SITE_OTHER): Payer: Medicare Other | Admitting: Interventional Cardiology

## 2018-10-01 ENCOUNTER — Encounter: Payer: Self-pay | Admitting: Interventional Cardiology

## 2018-10-01 VITALS — BP 138/66 | HR 53 | Ht 59.0 in | Wt 225.6 lb

## 2018-10-01 DIAGNOSIS — I890 Lymphedema, not elsewhere classified: Secondary | ICD-10-CM

## 2018-10-01 DIAGNOSIS — E782 Mixed hyperlipidemia: Secondary | ICD-10-CM

## 2018-10-01 DIAGNOSIS — E119 Type 2 diabetes mellitus without complications: Secondary | ICD-10-CM

## 2018-10-01 DIAGNOSIS — I10 Essential (primary) hypertension: Secondary | ICD-10-CM | POA: Diagnosis not present

## 2018-10-01 NOTE — Patient Instructions (Signed)

## 2018-10-03 DIAGNOSIS — L501 Idiopathic urticaria: Secondary | ICD-10-CM | POA: Diagnosis not present

## 2018-10-03 DIAGNOSIS — Z6841 Body Mass Index (BMI) 40.0 and over, adult: Secondary | ICD-10-CM | POA: Diagnosis not present

## 2018-10-07 DIAGNOSIS — L501 Idiopathic urticaria: Secondary | ICD-10-CM | POA: Diagnosis not present

## 2018-10-14 ENCOUNTER — Other Ambulatory Visit: Payer: Self-pay | Admitting: Obstetrics & Gynecology

## 2018-10-14 NOTE — Telephone Encounter (Signed)
Medication refill request: ambien  Last AEX:  11/29/17 SM Next AEX: 02/11/19  Last MMG (if hormonal medication request): 02/22/18 BIRADS1:Neg  Refill authorized: 04/19/18 #30/5R.   Please advise.

## 2018-10-22 ENCOUNTER — Ambulatory Visit: Payer: Medicare Other | Admitting: Obstetrics & Gynecology

## 2018-10-25 DIAGNOSIS — L501 Idiopathic urticaria: Secondary | ICD-10-CM | POA: Diagnosis not present

## 2018-11-13 DIAGNOSIS — L501 Idiopathic urticaria: Secondary | ICD-10-CM | POA: Diagnosis not present

## 2018-12-06 DIAGNOSIS — L501 Idiopathic urticaria: Secondary | ICD-10-CM | POA: Diagnosis not present

## 2018-12-17 ENCOUNTER — Other Ambulatory Visit: Payer: Self-pay | Admitting: Interventional Cardiology

## 2018-12-26 DIAGNOSIS — L501 Idiopathic urticaria: Secondary | ICD-10-CM | POA: Diagnosis not present

## 2018-12-30 DIAGNOSIS — R1031 Right lower quadrant pain: Secondary | ICD-10-CM | POA: Diagnosis not present

## 2018-12-31 ENCOUNTER — Ambulatory Visit
Admission: RE | Admit: 2018-12-31 | Discharge: 2018-12-31 | Disposition: A | Discharge status: Home / Self Care | Payer: Medicare Other | Source: Ambulatory Visit | Attending: Family Medicine | Admitting: Family Medicine

## 2018-12-31 ENCOUNTER — Other Ambulatory Visit: Payer: Self-pay | Admitting: Family Medicine

## 2018-12-31 DIAGNOSIS — R52 Pain, unspecified: Secondary | ICD-10-CM

## 2018-12-31 DIAGNOSIS — K862 Cyst of pancreas: Secondary | ICD-10-CM | POA: Diagnosis not present

## 2018-12-31 MED ORDER — IOPAMIDOL (ISOVUE-300) INJECTION 61%
100.0000 mL | Freq: Once | INTRAVENOUS | Status: AC | PRN
  Administered 2018-12-31: 100 mL via INTRAVENOUS

## 2019-01-06 ENCOUNTER — Other Ambulatory Visit: Payer: Self-pay | Admitting: Obstetrics & Gynecology

## 2019-01-06 NOTE — Telephone Encounter (Signed)
Medication refill request: Zolpidem  Last AEX:  11/29/17 SM  Next AEX: 02/11/19  Last MMG (if hormonal medication request): 02/22/18 BIRADS 1 negative/density c  Refill authorized: 10/14/18 #30 w/2 refills; spoke with patient, she states that she has another week left, but will need a refill soon.  Quantity and number refills adjusted in the order to last patient until AEX if authorized.

## 2019-01-10 DIAGNOSIS — R932 Abnormal findings on diagnostic imaging of liver and biliary tract: Secondary | ICD-10-CM | POA: Diagnosis not present

## 2019-01-10 DIAGNOSIS — R1011 Right upper quadrant pain: Secondary | ICD-10-CM | POA: Diagnosis not present

## 2019-01-10 DIAGNOSIS — D696 Thrombocytopenia, unspecified: Secondary | ICD-10-CM | POA: Diagnosis not present

## 2019-01-15 NOTE — Progress Notes (Signed)
Cardiology Office Note   Date:  01/16/2019   ID:  Alexandra Williams, Alexandra December 11, 1941, MRN [id number]  PCP:  Aretta Nip, MD    No chief complaint on file.  Coronary calcification  Wt Readings from Last 3 Encounters:  01/16/19 227 lb 6.4 oz (103.1 kg)  10/01/18 225 lb 9.6 oz (102.3 kg)  11/29/17 223 lb (101.2 kg)       History of Present Illness: Alexandra Williams is a 78 y.o. female  Who has had difficult to control hypertension. SHe has had hyperlipidemia as well.   Alexandra Williams was a patient of mine, but he passed away in 05-13-16 after developing ESRD.  She has had chronic edema in Alexandra LE as well, thought to be lymphedema.SOme days are worse than others. She uses an inflation device to help with compression.  SHe uses compression stockings and tries to elevate legs as well.   She has had anxiety at thought that she is responsible for Alexandra special needs Williams.  She was looking for a program that she could transition to.  Since the last visit, the patient had an abdominal CT.  Cardiomegaly was noted.  There were coronary artery calcifications as well.  The 2018 echocardiogram showed: Left ventricle: The cavity size was normal. Systolic function was   normal. The estimated ejection fraction was in the range of 55%   to 60%. Wall motion was normal; there were no regional wall   motion abnormalities. Features are consistent with a pseudonormal   left ventricular filling pattern, with concomitant abnormal   relaxation and increased filling pressure (grade 2 diastolic   dysfunction). - Mitral valve: There was mild regurgitation. - Left atrium: The atrium was mildly dilated. - Right ventricle: The cavity size was mildly dilated. Wall   thickness was normal. - Tricuspid valve: There was trivial regurgitation.  Impressions:  - Normal LVF with EF 55-60%, mild MR, grade 2DD, mild LAE, mildly   dilated RV and trivial TR.  Dilated atria and mildly dilated RV  were noted.  Abdominal CT was done for abdominal pain.  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 She feels better.  She has a dry cough.  She thinks it was allergies.  Lisinopril was changed years ago.    Past Medical History:  Diagnosis Date  . Abnormal Pap smear 4/82   colpo/ecc negative  . Arthritis    knee  . Cervical polyp 1/99  . Colon polyps   . Diabetes mellitus without complication (HCC)    pre-diabetes  . Elevated lipids   . Fibrocystic breast   . Hives    undetermined-on 7 antihistamines  . Hyperlipidemia   . Hypertension   . Postmenopausal bleeding 3/99   on HRT/endometrial biopsy-focal simple hyperplasia  . Uterine fibroid   . Vasculitis (HCC)    bilat legs  . Vitamin D deficiency     Past Surgical History:  Procedure Laterality Date  . INCISION AND DRAINAGE     Bartholin cyst  . TONSILLECTOMY AND ADENOIDECTOMY     as a child     Current Outpatient Medications  Medication Sig Dispense Refill  . acetaminophen (RA ACETAMINOPHEN) 650 MG CR tablet Take 650 mg by mouth as needed for pain.     Marland Kitchen amLODipine (NORVASC) 10 MG tablet Take 1 tablet (10 mg total) by mouth every evening. Please keep upcoming appt for future refills. 90 tablet 0  .  aspirin 81 MG tablet Take 81 mg by mouth daily.    Marland Kitchen atorvastatin (LIPITOR) 40 MG tablet TAKE 1 TABLET BY MOUTH EVERY DAY. KEEP UPCOMING APPOINTMENT IN OCTOBER 90 tablet 3  . EPIPEN 2-PAK 0.3 MG/0.3ML SOAJ Inject 0.3 mg into the muscle as needed (HIVES).     . hydroxychloroquine (PLAQUENIL) 200 MG tablet Take 200 mg by mouth daily.     Marland Kitchen loratadine (CLARITIN) 10 MG tablet Take 1 tablet by mouth every evening.    Marland Kitchen losartan (COZAAR) 100 MG tablet Take 100 mg by mouth daily.    . metFORMIN (GLUCOPHAGE-XR) 500 MG 24 hr tablet Take 500 mg by mouth daily.  0  . metoprolol tartrate (LOPRESSOR) 50 MG tablet Take 50 mg by mouth daily.    . Omalizumab (XOLAIR ) Inject into the skin every 14  (fourteen) days.     [name] DELICA LANCETS 61W MISC   1  . ONETOUCH VERIO test strip   1  . zolpidem (AMBIEN) 5 MG tablet TAKE 1 TABLET BY MOUTH EVERY DAY AT BEDTIME 30 tablet 1   No current facility-administered medications for this visit.     Allergies:   Codeine; Peanuts [peanut oil]; Shellfish allergy; Sulfites; Tessalon [benzonatate]; Hylan g-f 20; and Sulfur    Social History:  The patient  reports that she has never smoked. She has never used smokeless tobacco. She reports that she does not drink alcohol or use drugs.   Family History:  The patient's family history includes Cancer in Alexandra father, mother, and paternal aunt; Diabetes in Alexandra mother; Heart attack in Alexandra father; Heart disease in Alexandra father; Hyperlipidemia in Alexandra mother; Hypertension in Alexandra father and mother; Kidney disease in Alexandra father; Mental retardation in Alexandra Williams; Pancreatic cancer in Alexandra mother; Ulcers in Alexandra father.    ROS:  Please see the history of present illness.   Otherwise, review of systems are positive for recent abdominal pain.   All other systems are reviewed and negative.    PHYSICAL EXAM: VS:  BP (!) 148/84   Pulse (!) 56   Ht 4\' 11"  (1.499 m)   Wt 227 lb 6.4 oz (103.1 kg)   LMP 12/18/2000   SpO2 97%   BMI 45.93 kg/m  , BMI Body mass index is 45.93 kg/m. GEN: Well nourished, well developed, in no acute distress  HEENT: normal  Neck: no JVD, carotid bruits, or masses Cardiac: RRR; no murmurs, rubs, or gallops,no edema  Respiratory:  clear to auscultation bilaterally, normal work of breathing GI: soft, nontender, nondistended, + BS MS: no deformity or atrophy  Skin: warm and dry, no rash Neuro:  Strength and sensation are intact Psych: euthymic mood, full affect    Recent Labs: No results found for requested labs within last 8760 hours.   Lipid Panel No results found for: CHOL, TRIG, HDL, CHOLHDL, VLDL, LDLCALC, LDLDIRECT   Other studies Reviewed: Additional studies/ records  that were reviewed today with results demonstrating: Abdominal CT reviewed, echocardiogram reviewed.   ASSESSMENT AND PLAN:  1. HTN: Continue blood pressure medications. 2. Lymphedema: Continue current management. 3. Hyperlipidemia: Controlled 4. DM: Continue current management. 5. Coronary calcification: No symptoms of angina.  I encouraged Alexandra to try to be more active.  We went over risk factor modification including healthy diet, more exercise, watching blood pressure and cholesterol.  LDL 64 in June 2019.  She has atrial enlargement and RV enlargement noted on echo in 2018.  No signs of left-sided  congestive heart failure.  No further testing needed at this time.  She will let us know she has any worsening symptoms.   Current medicines are reviewed at length with the patient today.  The patient concerns regarding Alexandra medicines were addressed.  The following changes have been made:  No change  Labs/ tests ordered today include:  No orders of the defined types were placed in this encounter.   Recommend 150 minutes/week of aerobic exercise Low fat, low carb, high fiber diet recommended  Disposition:   FU in 1 year   Signed, Larae Grooms, MD  01/16/2019 10:42 AM    [facility] [place], [place], [place]  [address] Phone: [phone]; Fax: [phone]

## 2019-01-16 ENCOUNTER — Encounter: Payer: Self-pay | Admitting: Interventional Cardiology

## 2019-01-16 ENCOUNTER — Ambulatory Visit (INDEPENDENT_AMBULATORY_CARE_PROVIDER_SITE_OTHER): Payer: Medicare Other | Admitting: Interventional Cardiology

## 2019-01-16 VITALS — BP 148/84 | HR 56 | Ht 59.0 in | Wt 227.4 lb

## 2019-01-16 DIAGNOSIS — E782 Mixed hyperlipidemia: Secondary | ICD-10-CM | POA: Diagnosis not present

## 2019-01-16 DIAGNOSIS — I890 Lymphedema, not elsewhere classified: Secondary | ICD-10-CM

## 2019-01-16 DIAGNOSIS — E119 Type 2 diabetes mellitus without complications: Secondary | ICD-10-CM

## 2019-01-16 DIAGNOSIS — I10 Essential (primary) hypertension: Secondary | ICD-10-CM

## 2019-01-16 NOTE — Patient Instructions (Signed)

## 2019-01-20 DIAGNOSIS — L501 Idiopathic urticaria: Secondary | ICD-10-CM | POA: Diagnosis not present

## 2019-01-21 ENCOUNTER — Other Ambulatory Visit: Payer: Self-pay | Admitting: Obstetrics & Gynecology

## 2019-01-21 DIAGNOSIS — Z1231 Encounter for screening mammogram for malignant neoplasm of breast: Secondary | ICD-10-CM

## 2019-01-29 DIAGNOSIS — E119 Type 2 diabetes mellitus without complications: Secondary | ICD-10-CM | POA: Diagnosis not present

## 2019-01-29 DIAGNOSIS — H2513 Age-related nuclear cataract, bilateral: Secondary | ICD-10-CM | POA: Diagnosis not present

## 2019-01-30 DIAGNOSIS — T783XXD Angioneurotic edema, subsequent encounter: Secondary | ICD-10-CM | POA: Diagnosis not present

## 2019-01-30 DIAGNOSIS — L501 Idiopathic urticaria: Secondary | ICD-10-CM | POA: Diagnosis not present

## 2019-01-30 DIAGNOSIS — J3089 Other allergic rhinitis: Secondary | ICD-10-CM | POA: Diagnosis not present

## 2019-01-30 DIAGNOSIS — J301 Allergic rhinitis due to pollen: Secondary | ICD-10-CM | POA: Diagnosis not present

## 2019-02-04 ENCOUNTER — Other Ambulatory Visit: Payer: Self-pay | Admitting: Interventional Cardiology

## 2019-02-10 DIAGNOSIS — L501 Idiopathic urticaria: Secondary | ICD-10-CM | POA: Diagnosis not present

## 2019-02-11 ENCOUNTER — Ambulatory Visit: Payer: Medicare Other | Admitting: Obstetrics & Gynecology

## 2019-02-11 NOTE — Progress Notes (Deleted)
78 y.o. G22P0003 Widowed White or Caucasian female here for annual exam.    Patient's last menstrual period was 12/18/2000.          Sexually active: {yes no:314532}  The current method of family planning is post menopausal status.    Exercising: {yes no:314532}  {types:19826} Smoker:  {YES [id number]  Health Maintenance: Pap:  2013 Neg  History of abnormal Pap:  yes MMG:  02/22/18 BIRADS1:neg. appt 02/24/19  Colonoscopy:  2015 polyps.  BMD:   01/28/16 Osteopenia  TDaP:  *** Pneumonia vaccine(s):  *** Shingrix:   *** Hep C testing: n/a Screening Labs: ***   reports that she has never smoked. She has never used smokeless tobacco. She reports that she does not drink alcohol or use drugs.  Past Medical History:  Diagnosis Date  . Abnormal Pap smear 4/82   colpo/ecc negative  . Arthritis    knee  . Cervical polyp 1/99  . Colon polyps   . Diabetes mellitus without complication (HCC)    pre-diabetes  . Elevated lipids   . Fibrocystic breast   . Hives    undetermined-on 7 antihistamines  . Hyperlipidemia   . Hypertension   . Postmenopausal bleeding 3/99   on HRT/endometrial biopsy-focal simple hyperplasia  . Uterine fibroid   . Vasculitis (HCC)    bilat legs  . Vitamin D deficiency     Past Surgical History:  Procedure Laterality Date  . INCISION AND DRAINAGE     Bartholin cyst  . TONSILLECTOMY AND ADENOIDECTOMY     as a child    Current Outpatient Medications  Medication Sig Dispense Refill  . acetaminophen (RA ACETAMINOPHEN) 650 MG CR tablet Take 650 mg by mouth as needed for pain.     Marland Kitchen amLODipine (NORVASC) 10 MG tablet Take 1 tablet (10 mg total) by mouth daily. 90 tablet 3  . aspirin 81 MG tablet Take 81 mg by mouth daily.    Marland Kitchen atorvastatin (LIPITOR) 40 MG tablet TAKE 1 TABLET BY MOUTH EVERY DAY. KEEP UPCOMING APPOINTMENT IN OCTOBER 90 tablet 3  . EPIPEN 2-PAK 0.3 MG/0.3ML SOAJ Inject 0.3 mg into the muscle as needed (HIVES).     . hydroxychloroquine (PLAQUENIL)  200 MG tablet Take 200 mg by mouth daily.     Marland Kitchen loratadine (CLARITIN) 10 MG tablet Take 1 tablet by mouth every evening.    Marland Kitchen losartan (COZAAR) 100 MG tablet Take 100 mg by mouth daily.    . metFORMIN (GLUCOPHAGE-XR) 500 MG 24 hr tablet Take 500 mg by mouth daily.  0  . metoprolol tartrate (LOPRESSOR) 50 MG tablet Take 50 mg by mouth daily.    . Omalizumab (XOLAIR [place]) Inject into the skin every 14 (fourteen) days.     [name] DELICA LANCETS 18H MISC   1  . ONETOUCH VERIO test strip   1  . zolpidem (AMBIEN) 5 MG tablet TAKE 1 TABLET BY MOUTH EVERY DAY AT BEDTIME 30 tablet 1   No current facility-administered medications for this visit.     Family History  Problem Relation Age of Onset  . Pancreatic cancer Mother   . Cancer Mother   . Diabetes Mother   . Hyperlipidemia Mother   . Hypertension Mother   . Heart attack Father   . Ulcers Father   . Heart disease Father   . Kidney disease Father   . Cancer Father   . Hypertension Father   . Mental retardation Daughter   . Cancer Paternal  Aunt        mouth    Review of Systems  Exam:   LMP 12/18/2000   Height:      Ht Readings from Last 3 Encounters:  01/16/19 4\' 11"  (1.499 m)  10/01/18 4\' 11"  (1.499 m)  11/29/17 4\' 11"  (1.499 m)    General appearance: alert, cooperative and appears stated age Head: Normocephalic, without obvious abnormality, atraumatic Neck: no adenopathy, supple, symmetrical, trachea midline and thyroid {EXAM; THYROID:18604} Lungs: clear to auscultation bilaterally Breasts: {Exam; breast:13139::"normal appearance, no masses or tenderness"} Heart: regular rate and rhythm Abdomen: soft, non-tender; bowel sounds normal; no masses,  no organomegaly Extremities: extremities normal, atraumatic, no cyanosis or edema Skin: Skin color, texture, turgor normal. No rashes or lesions Lymph nodes: Cervical, supraclavicular, and axillary nodes normal. No abnormal inguinal nodes palpated Neurologic: Grossly  normal   Pelvic: External genitalia:  no lesions              Urethra:  normal appearing urethra with no masses, tenderness or lesions              Bartholins and Skenes: normal                 Vagina: normal appearing vagina with normal color and discharge, no lesions              Cervix: {exam; cervix:14595}              Pap taken: {yes no:314532} Bimanual Exam:  Uterus:  {exam; uterus:12215}              Adnexa: {exam; adnexa:12223}               Rectovaginal: Confirms               Anus:  normal sphincter tone, no lesions  Chaperone was present for exam.  A:  Well Woman with normal exam  P:   {plan; gyn:5269::"mammogram","pap smear","return annually or prn"}

## 2019-02-13 ENCOUNTER — Other Ambulatory Visit: Payer: Self-pay

## 2019-02-13 ENCOUNTER — Encounter: Payer: Self-pay | Admitting: Obstetrics & Gynecology

## 2019-02-13 ENCOUNTER — Ambulatory Visit (INDEPENDENT_AMBULATORY_CARE_PROVIDER_SITE_OTHER): Payer: Medicare Other | Admitting: Obstetrics & Gynecology

## 2019-02-13 VITALS — BP 136/72 | HR 70 | Resp 16 | Ht 59.0 in | Wt 229.2 lb

## 2019-02-13 DIAGNOSIS — Z124 Encounter for screening for malignant neoplasm of cervix: Secondary | ICD-10-CM

## 2019-02-13 DIAGNOSIS — Z01419 Encounter for gynecological examination (general) (routine) without abnormal findings: Secondary | ICD-10-CM | POA: Diagnosis not present

## 2019-02-13 NOTE — Progress Notes (Signed)
78 y.o. G50P0003 Widowed White or Caucasian female here for annual exam.  Mobility continues to deteriorate.  Winter weather and rain does contribute as well.  Right knee in particular has been extremely painful.  Needs knee replacement.  Does have gel injections which help a little.  Has appt scheduled for March 11.    Denies vaginal bleeding.    Does have plans to go to [facility] to look at apartments for her and her daughter.    Reviewed colonoscopy screening with pt.  She did have appt in January due to RLQ pain.  Had CT scan done showing 2.2 x 1.2 x 1.3cm cystic structure.  Has follow up in 4 months planned.  Saw Dr. Watt Climes.    Patient's last menstrual period was 12/18/2000.          Sexually active: No.  The current method of family planning is post menopausal status.    Exercising: No.  The patient does not participate in regular exercise at present. Smoker:  no  Health Maintenance: Pap:  2013 Neg  History of abnormal Pap:  yes MMG:  02/22/18 BIRADS1:neg. Has appt 02/24/2019 Colonoscopy:  2015.  Follow up 5 years.  Pt aware I think this is overdue.  States she and Dr. Radene Ou have discussed and is planning in another year or two. BMD:   01/28/16 Osteopenia  TDaP:  UTD with PCP  Pneumonia vaccine(s):  Completed Shingrix:   Completed Hep C testing: no  Screening Labs: PCP   reports that she has never smoked. She has never used smokeless tobacco. She reports that she does not drink alcohol or use drugs.  Past Medical History:  Diagnosis Date  . Abnormal Pap smear 4/82   colpo/ecc negative  . Arthritis    knee  . Cervical polyp 1/99  . Colon polyps   . Diabetes mellitus without complication (HCC)    pre-diabetes  . Elevated lipids   . Fibrocystic breast   . Hives    undetermined-on 7 antihistamines  . Hyperlipidemia   . Hypertension   . Postmenopausal bleeding 3/99   on HRT/endometrial biopsy-focal simple hyperplasia  . Uterine fibroid   . Vasculitis (HCC)    bilat legs   . Vitamin D deficiency     Past Surgical History:  Procedure Laterality Date  . INCISION AND DRAINAGE     Bartholin cyst  . TONSILLECTOMY AND ADENOIDECTOMY     as a child    Current Outpatient Medications  Medication Sig Dispense Refill  . acetaminophen (RA ACETAMINOPHEN) 650 MG CR tablet Take 650 mg by mouth as needed for pain.     Marland Kitchen amLODipine (NORVASC) 10 MG tablet Take 1 tablet (10 mg total) by mouth daily. 90 tablet 3  . aspirin 81 MG tablet Take 81 mg by mouth daily.    Marland Kitchen atorvastatin (LIPITOR) 40 MG tablet TAKE 1 TABLET BY MOUTH EVERY DAY. KEEP UPCOMING APPOINTMENT IN OCTOBER 90 tablet 3  . EPIPEN 2-PAK 0.3 MG/0.3ML SOAJ Inject 0.3 mg into the muscle as needed (HIVES).     . hydroxychloroquine (PLAQUENIL) 200 MG tablet Take 200 mg by mouth daily.     Marland Kitchen loratadine (CLARITIN) 10 MG tablet Take 1 tablet by mouth every evening.    Marland Kitchen losartan (COZAAR) 100 MG tablet Take 100 mg by mouth daily.    . metFORMIN (GLUCOPHAGE-XR) 500 MG 24 hr tablet Take 500 mg by mouth daily.  0  . metoprolol tartrate (LOPRESSOR) 50 MG tablet Take 50 mg  by mouth daily.    . Omalizumab (XOLAIR [place]) Inject into the skin every 14 (fourteen) days.     [name] DELICA LANCETS 17G MISC   1  . ONETOUCH VERIO test strip   1  . zolpidem (AMBIEN) 5 MG tablet TAKE 1 TABLET BY MOUTH EVERY DAY AT BEDTIME 30 tablet 1   No current facility-administered medications for this visit.     Family History  Problem Relation Age of Onset  . Pancreatic cancer Mother   . Cancer Mother   . Diabetes Mother   . Hyperlipidemia Mother   . Hypertension Mother   . Heart attack Father   . Ulcers Father   . Heart disease Father   . Kidney disease Father   . Cancer Father   . Hypertension Father   . Mental retardation Daughter   . Cancer Paternal Aunt        mouth    Review of Systems  HENT: Positive for hearing loss.   Cardiovascular: Positive for leg swelling.  Gastrointestinal:       Change in quality/character of  stools  Genitourinary: Positive for frequency.  Musculoskeletal: Positive for arthralgias and myalgias.  Skin:       Hair loss  All other systems reviewed and are negative.   Exam:   BP 136/72 (BP Location: Right Arm, Patient Position: Sitting, Cuff Size: Large)   Pulse 70   Resp 16   Ht 4\' 11"  (1.499 m)   Wt 229 lb 4 oz (104 kg)   LMP 12/18/2000   BMI 46.30 kg/m    Height: 4\' 11"  (149.9 cm)  Ht Readings from Last 3 Encounters:  02/13/19 4\' 11"  (1.499 m)  01/16/19 4\' 11"  (1.499 m)  10/01/18 4\' 11"  (1.499 m)    General appearance: alert, cooperative and appears stated age Head: Normocephalic, without obvious abnormality, atraumatic Neck: no adenopathy, supple, symmetrical, trachea midline and thyroid normal to inspection and palpation Lungs: clear to auscultation bilaterally Breasts: normal appearance, no masses or tenderness Heart: regular rate and rhythm Abdomen: soft, non-tender; bowel sounds normal; no masses,  no organomegaly Extremities: extremities normal, atraumatic, no cyanosis or edema Skin: Skin color, texture, turgor normal. No rashes or lesions Lymph nodes: Cervical, supraclavicular, and axillary nodes normal. No abnormal inguinal nodes palpated Neurologic: Grossly normal   Pelvic: External genitalia:  no lesions              Urethra:  normal appearing urethra with no masses, tenderness or lesions              Bartholins and Skenes: normal                 Vagina: normal appearing vagina with normal color and discharge, no lesions              Cervix: no lesions              Pap taken: No. Bimanual Exam:  Uterus:  normal size, contour, position, consistency, mobility, non-tender              Adnexa: normal adnexa and no mass, fullness, tenderness               Rectovaginal: Confirms               Anus:  normal sphincter tone, no lesions  Chaperone was present for exam.  A:  Well Woman with normal exam PMP, no HRT Obesity, BMI 46 Hypertension Right knee  pain,  osteoarthritis Chronic lymphedema, did see PT again last year Elevated lipids Type 2 DM Chronic insomnia  P:   Mammogram guidelines reviewed pap smear not indicated based on age and hx On Ambien.  Does not need refill at this time.  Pharmacy will fax. Lab work and vaccines UTD with Dr. Zadie Rhine.  She has completed the shingrix vaccination. Has appt with Dr. Elmyra Ricks to discussed knee replacement Colonoscopy due by the records that I have Return annually or prn

## 2019-02-18 DIAGNOSIS — H8101 Meniere's disease, right ear: Secondary | ICD-10-CM | POA: Diagnosis not present

## 2019-02-18 DIAGNOSIS — E119 Type 2 diabetes mellitus without complications: Secondary | ICD-10-CM | POA: Diagnosis not present

## 2019-02-18 DIAGNOSIS — H903 Sensorineural hearing loss, bilateral: Secondary | ICD-10-CM | POA: Diagnosis not present

## 2019-02-24 ENCOUNTER — Ambulatory Visit
Admission: RE | Admit: 2019-02-24 | Discharge: 2019-02-24 | Disposition: A | Discharge status: Home / Self Care | Payer: Medicare Other | Source: Ambulatory Visit | Attending: Obstetrics & Gynecology | Admitting: Obstetrics & Gynecology

## 2019-02-24 DIAGNOSIS — Z1231 Encounter for screening mammogram for malignant neoplasm of breast: Secondary | ICD-10-CM

## 2019-02-26 DIAGNOSIS — M25562 Pain in left knee: Secondary | ICD-10-CM | POA: Diagnosis not present

## 2019-02-26 DIAGNOSIS — M170 Bilateral primary osteoarthritis of knee: Secondary | ICD-10-CM | POA: Diagnosis not present

## 2019-02-26 DIAGNOSIS — M25561 Pain in right knee: Secondary | ICD-10-CM | POA: Diagnosis not present

## 2019-03-05 DIAGNOSIS — M170 Bilateral primary osteoarthritis of knee: Secondary | ICD-10-CM | POA: Diagnosis not present

## 2019-03-09 ENCOUNTER — Other Ambulatory Visit: Payer: Self-pay | Admitting: Obstetrics & Gynecology

## 2019-03-10 NOTE — Telephone Encounter (Signed)
Medication refill request: ambien 5mg  Last AEX:  02-13-2019 Next AEX: 07-02-2020 Last MMG (if hormonal medication request): 03-07-2019 category c density birads 1:neg Refill authorized: patient did not refill at aex 02-13-19 & was to let us know when she needed it. Refill came in from pharmacy. Please approve or deny as appropriate

## 2019-03-12 DIAGNOSIS — M170 Bilateral primary osteoarthritis of knee: Secondary | ICD-10-CM | POA: Diagnosis not present

## 2019-03-21 DIAGNOSIS — L501 Idiopathic urticaria: Secondary | ICD-10-CM | POA: Diagnosis not present

## 2019-04-15 DIAGNOSIS — L501 Idiopathic urticaria: Secondary | ICD-10-CM | POA: Diagnosis not present

## 2019-05-02 ENCOUNTER — Other Ambulatory Visit: Payer: Self-pay | Admitting: Obstetrics & Gynecology

## 2019-05-02 NOTE — Telephone Encounter (Signed)
Medication refill request: ambien  Last AEX:  02/13/19 SM Next AEX: 07/02/20 SM Last MMG (if hormonal medication request): 02/24/19 BIRADS1:neg  Refill authorized: 03/11/19 #30/1R. Today please advise.

## 2019-05-05 DIAGNOSIS — L501 Idiopathic urticaria: Secondary | ICD-10-CM | POA: Diagnosis not present

## 2019-05-05 NOTE — Telephone Encounter (Signed)
Rx faxed to pharmacy  

## 2019-05-27 DIAGNOSIS — L501 Idiopathic urticaria: Secondary | ICD-10-CM | POA: Diagnosis not present

## 2019-06-04 DIAGNOSIS — E119 Type 2 diabetes mellitus without complications: Secondary | ICD-10-CM | POA: Diagnosis not present

## 2019-06-04 DIAGNOSIS — Z0000 Encounter for general adult medical examination without abnormal findings: Secondary | ICD-10-CM | POA: Diagnosis not present

## 2019-06-04 DIAGNOSIS — Z658 Other specified problems related to psychosocial circumstances: Secondary | ICD-10-CM | POA: Diagnosis not present

## 2019-06-04 DIAGNOSIS — E7800 Pure hypercholesterolemia, unspecified: Secondary | ICD-10-CM | POA: Diagnosis not present

## 2019-06-04 DIAGNOSIS — Z7984 Long term (current) use of oral hypoglycemic drugs: Secondary | ICD-10-CM | POA: Diagnosis not present

## 2019-06-04 DIAGNOSIS — L501 Idiopathic urticaria: Secondary | ICD-10-CM | POA: Diagnosis not present

## 2019-06-04 DIAGNOSIS — I10 Essential (primary) hypertension: Secondary | ICD-10-CM | POA: Diagnosis not present

## 2019-06-10 DIAGNOSIS — L501 Idiopathic urticaria: Secondary | ICD-10-CM | POA: Diagnosis not present

## 2019-06-17 DIAGNOSIS — Z658 Other specified problems related to psychosocial circumstances: Secondary | ICD-10-CM | POA: Diagnosis not present

## 2019-06-17 DIAGNOSIS — L501 Idiopathic urticaria: Secondary | ICD-10-CM | POA: Diagnosis not present

## 2019-06-17 DIAGNOSIS — Z7984 Long term (current) use of oral hypoglycemic drugs: Secondary | ICD-10-CM | POA: Diagnosis not present

## 2019-06-17 DIAGNOSIS — E119 Type 2 diabetes mellitus without complications: Secondary | ICD-10-CM | POA: Diagnosis not present

## 2019-06-17 DIAGNOSIS — I10 Essential (primary) hypertension: Secondary | ICD-10-CM | POA: Diagnosis not present

## 2019-06-17 DIAGNOSIS — Z0000 Encounter for general adult medical examination without abnormal findings: Secondary | ICD-10-CM | POA: Diagnosis not present

## 2019-06-17 DIAGNOSIS — E7800 Pure hypercholesterolemia, unspecified: Secondary | ICD-10-CM | POA: Diagnosis not present

## 2019-06-19 DIAGNOSIS — E119 Type 2 diabetes mellitus without complications: Secondary | ICD-10-CM | POA: Diagnosis not present

## 2019-06-19 DIAGNOSIS — Z7984 Long term (current) use of oral hypoglycemic drugs: Secondary | ICD-10-CM | POA: Diagnosis not present

## 2019-06-19 DIAGNOSIS — I10 Essential (primary) hypertension: Secondary | ICD-10-CM | POA: Diagnosis not present

## 2019-06-19 DIAGNOSIS — E7800 Pure hypercholesterolemia, unspecified: Secondary | ICD-10-CM | POA: Diagnosis not present

## 2019-07-01 DIAGNOSIS — L501 Idiopathic urticaria: Secondary | ICD-10-CM | POA: Diagnosis not present

## 2019-07-04 DIAGNOSIS — T783XXD Angioneurotic edema, subsequent encounter: Secondary | ICD-10-CM | POA: Diagnosis not present

## 2019-07-05 ENCOUNTER — Other Ambulatory Visit: Payer: Self-pay | Admitting: Gastroenterology

## 2019-07-05 DIAGNOSIS — R932 Abnormal findings on diagnostic imaging of liver and biliary tract: Secondary | ICD-10-CM

## 2019-07-27 ENCOUNTER — Other Ambulatory Visit: Payer: Self-pay

## 2019-07-27 ENCOUNTER — Ambulatory Visit
Admission: RE | Admit: 2019-07-27 | Discharge: 2019-07-27 | Disposition: A | Discharge status: Home / Self Care | Payer: Medicare Other | Source: Ambulatory Visit | Attending: Gastroenterology | Admitting: Gastroenterology

## 2019-07-27 DIAGNOSIS — R932 Abnormal findings on diagnostic imaging of liver and biliary tract: Secondary | ICD-10-CM

## 2019-07-27 DIAGNOSIS — K862 Cyst of pancreas: Secondary | ICD-10-CM | POA: Diagnosis not present

## 2019-07-27 MED ORDER — GADOBENATE DIMEGLUMINE 529 MG/ML IV SOLN
20.0000 mL | Freq: Once | INTRAVENOUS | Status: AC | PRN
  Administered 2019-07-27: 20 mL via INTRAVENOUS

## 2019-08-05 DIAGNOSIS — L501 Idiopathic urticaria: Secondary | ICD-10-CM | POA: Diagnosis not present

## 2019-08-26 DIAGNOSIS — L501 Idiopathic urticaria: Secondary | ICD-10-CM | POA: Diagnosis not present

## 2019-09-11 DIAGNOSIS — E782 Mixed hyperlipidemia: Secondary | ICD-10-CM | POA: Diagnosis not present

## 2019-09-11 DIAGNOSIS — Z7984 Long term (current) use of oral hypoglycemic drugs: Secondary | ICD-10-CM | POA: Diagnosis not present

## 2019-09-11 DIAGNOSIS — I10 Essential (primary) hypertension: Secondary | ICD-10-CM | POA: Diagnosis not present

## 2019-09-11 DIAGNOSIS — H2513 Age-related nuclear cataract, bilateral: Secondary | ICD-10-CM | POA: Diagnosis not present

## 2019-09-11 DIAGNOSIS — H5203 Hypermetropia, bilateral: Secondary | ICD-10-CM | POA: Diagnosis not present

## 2019-09-11 DIAGNOSIS — E119 Type 2 diabetes mellitus without complications: Secondary | ICD-10-CM | POA: Diagnosis not present

## 2019-09-11 DIAGNOSIS — H52203 Unspecified astigmatism, bilateral: Secondary | ICD-10-CM | POA: Diagnosis not present

## 2019-09-19 DIAGNOSIS — Z23 Encounter for immunization: Secondary | ICD-10-CM | POA: Diagnosis not present

## 2019-09-25 DIAGNOSIS — E782 Mixed hyperlipidemia: Secondary | ICD-10-CM | POA: Diagnosis not present

## 2019-09-25 DIAGNOSIS — E7800 Pure hypercholesterolemia, unspecified: Secondary | ICD-10-CM | POA: Diagnosis not present

## 2019-09-25 DIAGNOSIS — I10 Essential (primary) hypertension: Secondary | ICD-10-CM | POA: Diagnosis not present

## 2019-09-25 DIAGNOSIS — E119 Type 2 diabetes mellitus without complications: Secondary | ICD-10-CM | POA: Diagnosis not present

## 2019-09-25 DIAGNOSIS — Z7984 Long term (current) use of oral hypoglycemic drugs: Secondary | ICD-10-CM | POA: Diagnosis not present

## 2019-10-01 DIAGNOSIS — L501 Idiopathic urticaria: Secondary | ICD-10-CM | POA: Diagnosis not present

## 2019-10-31 DIAGNOSIS — L501 Idiopathic urticaria: Secondary | ICD-10-CM | POA: Diagnosis not present

## 2019-11-05 ENCOUNTER — Other Ambulatory Visit: Payer: Self-pay | Admitting: Obstetrics & Gynecology

## 2019-11-05 DIAGNOSIS — Z01419 Encounter for gynecological examination (general) (routine) without abnormal findings: Secondary | ICD-10-CM

## 2019-11-05 NOTE — Telephone Encounter (Signed)
Med refill request: Ambien Last AEX: 02/13/2019 Next AEX: 07/02/2020 Last MMG (if hormonal med) 02/24/19, birads1 , negative Refill authorized: 30 tabs/ 1 RF, pended if approved.

## 2019-11-10 DIAGNOSIS — J3089 Other allergic rhinitis: Secondary | ICD-10-CM | POA: Diagnosis not present

## 2019-11-10 DIAGNOSIS — L501 Idiopathic urticaria: Secondary | ICD-10-CM | POA: Diagnosis not present

## 2019-11-10 DIAGNOSIS — T783XXD Angioneurotic edema, subsequent encounter: Secondary | ICD-10-CM | POA: Diagnosis not present

## 2019-11-10 DIAGNOSIS — J301 Allergic rhinitis due to pollen: Secondary | ICD-10-CM | POA: Diagnosis not present

## 2019-12-30 ENCOUNTER — Other Ambulatory Visit: Payer: Self-pay | Admitting: Interventional Cardiology

## 2019-12-30 MED ORDER — ATORVASTATIN CALCIUM 40 MG PO TABS: ORAL_TABLET | ORAL | 0 refills | Status: DC

## 2020-01-03 ENCOUNTER — Other Ambulatory Visit: Payer: Self-pay | Admitting: Obstetrics & Gynecology

## 2020-01-03 DIAGNOSIS — Z01419 Encounter for gynecological examination (general) (routine) without abnormal findings: Secondary | ICD-10-CM

## 2020-01-05 NOTE — Telephone Encounter (Signed)
Medication refill request: Ambien  Last AEX:  02-13-2019 SM  Next AEX: 07-02-20 Last MMG (if hormonal medication request): n/a Refill authorized: Today, please advise.   Medication pended for #30, 1RF. Please refill if appropriate.

## 2020-01-07 ENCOUNTER — Ambulatory Visit: Payer: Medicare Other | Attending: Internal Medicine

## 2020-01-07 DIAGNOSIS — Z23 Encounter for immunization: Secondary | ICD-10-CM | POA: Insufficient documentation

## 2020-01-07 NOTE — Progress Notes (Signed)
   Covid-19 Vaccination Clinic  Name:  BANDI RENSLOW    MRN: [id number] DOB: [birth year]  01/07/2020  Ms. Smilowitz was observed post Covid-19 immunization for 15 minutes without incidence. She was provided with Vaccine Information Sheet and instruction to access the V-Safe system.   Ms. Koster was instructed to call 911 with any severe reactions post vaccine: Marland Kitchen Difficulty breathing  . Swelling of your face and throat  . A fast heartbeat  . A bad rash all over your body  . Dizziness and weakness    Immunizations Administered    Name Date Dose VIS Date Route   Pfizer COVID-19 Vaccine 01/07/2020 11:50 AM 0.3 mL 11/28/2019 Intramuscular   Manufacturer: [place]   Lot: [id number]   [place]: [id number]

## 2020-01-09 ENCOUNTER — Other Ambulatory Visit: Payer: Self-pay | Admitting: Obstetrics & Gynecology

## 2020-01-09 DIAGNOSIS — Z01419 Encounter for gynecological examination (general) (routine) without abnormal findings: Secondary | ICD-10-CM

## 2020-01-09 NOTE — Telephone Encounter (Signed)
Called patient to let her know that rx had been sent to a provider already but I was told patient was unavailable. I left a message for her to call us back.

## 2020-01-09 NOTE — Telephone Encounter (Signed)
Patient calling to check status of refill request.

## 2020-01-09 NOTE — Telephone Encounter (Signed)
Patient returned call

## 2020-01-09 NOTE — Telephone Encounter (Signed)
Medication refill request: Ambien 5mg  Last AEX:  2-01-24-2019 Next AEX: 07-02-2020 Last MMG (if hormonal medication request): n/a Refill authorized: please approve if appropriate

## 2020-01-12 NOTE — Telephone Encounter (Signed)
Rx has been completed

## 2020-01-17 NOTE — Progress Notes (Signed)
Cardiology Office Note   Date:  01/19/2020   ID:  Alexandra Williams, DOB [birth year], MRN [id number]  PCP:  Aretta Nip, MD    No chief complaint on file.  CAD  Wt Readings from Last 3 Encounters:  01/19/20 237 lb (107.5 kg)  02/13/19 229 lb 4 oz (104 kg)  01/16/19 227 lb 6.4 oz (103.1 kg)       History of Present Illness: Alexandra Williams is a 79 y.o. female    Who has had difficult to control hypertension. SHe has had hyperlipidemia as well.   Her husband was apatientof mine, but he passed away in 05-20-2016 after developing ESRD.  She has had chronic edema in her LE as well, thought to be lymphedema.SOme days are worse than others. She uses an inflation device to help with compression.SHe uses compression stockings and tries to elevate legs as well.   She has had anxiety at thought that she is responsible for her special needs daughter. She was looking for a program that she could transition to.  In 2018, the patient had an abdominal CT.  Cardiomegaly was noted.  There were coronary artery calcifications as well.  In the past, it was noted: "Coronary calcification: No symptoms of angina.  I encouraged her to try to be more active.  We went over risk factor modification including healthy diet, more exercise, watching blood pressure and cholesterol.  LDL 64 in June 2019.  She has atrial enlargement and RV enlargement noted on echo in 2018.  No signs of left-sided congestive heart failure.  No further testing needed at this time.  She will let us know she has any worsening symptoms."  The 2018 echocardiogram showed: Left ventricle: The cavity size was normal. Systolic function was normal. The estimated ejection fraction was in the range of 55% to 60%. Wall motion was normal; there were no regional wall motion abnormalities. Features are consistent with a pseudonormal left ventricular filling pattern, with concomitant abnormal relaxation and  increased filling pressure (grade 2 diastolic dysfunction). - Mitral valve: There was mild regurgitation. - Left atrium: The atrium was mildly dilated. - Right ventricle: The cavity size was mildly dilated. Wall thickness was normal. - Tricuspid valve: There was trivial regurgitation.  Impressions:  - Normal LVF with EF 55-60%, mild MR, grade 2DD, mild LAE, mildly dilated RV and trivial TR.  Dilated atria and mildly dilated RV were noted.  Abdominal CT was done for abdominal pain.  Her son is a Art therapist from [place] and he helped treat her as well. She feels better.  She has a dry cough.  She thinks it was allergies.  Lisinopril was changed years ago.   Since the last visit, she has been wearing her mask and staying out of crowds.  Denies : Chest pain. Dizziness. Nitroglycerin use. Orthopnea. Palpitations. Paroxysmal nocturnal dyspnea. Shortness of breath. Syncope.   She had her vaccine dose last week. Arm soreness.   Son has moved to [place], [place] from [place].   Past Medical History:  Diagnosis Date  . Abnormal Pap smear 4/82   colpo/ecc negative  . Arthritis    knee  . Cervical polyp 1/99  . Colon polyps   . Diabetes mellitus without complication (HCC)    pre-diabetes  . Elevated lipids   . Fibrocystic breast   . Hives    undetermined-on 7 antihistamines  . Hyperlipidemia   . Hypertension   . Postmenopausal bleeding 3/99   on HRT/endometrial  biopsy-focal simple hyperplasia  . Uterine fibroid   . Vasculitis (HCC)    bilat legs  . Vitamin D deficiency     Past Surgical History:  Procedure Laterality Date  . BREAST BIOPSY Left   . INCISION AND DRAINAGE     Bartholin cyst  . TONSILLECTOMY AND ADENOIDECTOMY     as a child     Current Outpatient Medications  Medication Sig Dispense Refill  . acetaminophen (RA ACETAMINOPHEN) 650 MG CR tablet Take 650 mg by mouth as needed for pain.     Marland Kitchen amLODipine (NORVASC) 10 MG tablet Take 1 tablet (10  mg total) by mouth daily. 90 tablet 3  . aspirin 81 MG tablet Take 81 mg by mouth daily.    Marland Kitchen atorvastatin (LIPITOR) 40 MG tablet TAKE 1 TABLET BY MOUTH EVERY DAY. Please keep upcoming appt in February with Dr. Irish Lack before anymore refills. Thank you 90 tablet 0  . EPIPEN 2-PAK 0.3 MG/0.3ML SOAJ Inject 0.3 mg into the muscle as needed (HIVES).     . hydroxychloroquine (PLAQUENIL) 200 MG tablet Take 200 mg by mouth daily.     Marland Kitchen loratadine (CLARITIN) 10 MG tablet Take 1 tablet by mouth every evening.    Marland Kitchen losartan (COZAAR) 100 MG tablet Take 100 mg by mouth daily.    . metFORMIN (GLUCOPHAGE-XR) 500 MG 24 hr tablet Take 500 mg by mouth daily.  0  . metoprolol tartrate (LOPRESSOR) 50 MG tablet Take 50 mg by mouth daily.    . Omalizumab (XOLAIR [place]) Inject into the skin every 14 (fourteen) days.     [name] DELICA LANCETS 99991111 MISC   1  . ONETOUCH VERIO test strip   1  . zolpidem (AMBIEN) 5 MG tablet TAKE 1 TABLET BY MOUTH EVERY DAY AT BEDTIME 30 tablet 5   No current facility-administered medications for this visit.    Allergies:   Codeine, Peanuts [peanut oil], Shellfish allergy, Sulfites, Tessalon [benzonatate], Hylan g-f 20, and Sulfur    Social History:  The patient  reports that she has never smoked. She has never used smokeless tobacco. She reports that she does not drink alcohol or use drugs.   Family History:  The patient's family history includes Cancer in her father, mother, and paternal aunt; Diabetes in her mother; Heart attack in her father; Heart disease in her father; Hyperlipidemia in her mother; Hypertension in her father and mother; Kidney disease in her father; Mental retardation in her daughter; Pancreatic cancer in her mother; Ulcers in her father.    ROS:  Please see the history of present illness.   Otherwise, review of systems are positive for persistent LE edema.   All other systems are reviewed and negative.    PHYSICAL EXAM: VS:  BP (!) 152/60   Pulse 63   Ht  4\' 11"  (1.499 m)   Wt 237 lb (107.5 kg)   LMP 12/18/2000   SpO2 90%   BMI 47.87 kg/m  , BMI Body mass index is 47.87 kg/m. GEN: Well nourished, well developed, in no acute distress  HEENT: normal  Neck: no JVD, carotid bruits, or masses Cardiac: RRR; 2/6 systolic murmurs, no rubs, or gallops, marked bilateral LE edema - chronic Respiratory:  clear to auscultation bilaterally, normal work of breathing GI: soft, nontender, nondistended, + BS MS: no deformity or atrophy  Skin: warm and dry, no rash Neuro:  Strength and sensation are intact Psych: euthymic mood, full affect   EKG:   The  ekg ordered today demonstrates NSR, no ST changes   Recent Labs: No results found for requested labs within last 8760 hours.   Lipid Panel No results found for: CHOL, TRIG, HDL, CHOLHDL, VLDL, LDLCALC, LDLDIRECT   Other studies Reviewed: Additional studies/ records that were reviewed today with results demonstrating: LDL 50 in 05/2019.   ASSESSMENT AND PLAN:  1. HTN: Higher readings.  Activity has decreased with COVID.  Increase activity.  Whole food, plant based diet recpmmended.  Low salt. 2. Lymphedema: Elevate legs.  Has seen Dr. Scot Dock in the past.  She is using a compression device.  3. Hyperlipidemia: The current medical regimen is effective;  continue present plan and medications. 4. DM: 6.7 A1C. As above. 5. Coronary calcification: no angina.  Continue medical therapy.    Current medicines are reviewed at length with the patient today.  The patient concerns regarding her medicines were addressed.  The following changes have been made:  No change  Labs/ tests ordered today include:  No orders of the defined types were placed in this encounter.   Recommend 150 minutes/week of aerobic exercise Low fat, low carb, high fiber diet recommended  Disposition:   FU in 1 year   Signed, Larae Grooms, MD  01/19/2020 11:00 AM    [facility] [address], [place], [place]  [address] Phone: [phone]; Fax: [phone]

## 2020-01-19 ENCOUNTER — Other Ambulatory Visit: Payer: Self-pay

## 2020-01-19 ENCOUNTER — Ambulatory Visit (INDEPENDENT_AMBULATORY_CARE_PROVIDER_SITE_OTHER): Payer: Medicare Other | Admitting: Interventional Cardiology

## 2020-01-19 ENCOUNTER — Encounter: Payer: Self-pay | Admitting: Interventional Cardiology

## 2020-01-19 VITALS — BP 152/60 | HR 63 | Ht 59.0 in | Wt 237.0 lb

## 2020-01-19 DIAGNOSIS — I2510 Atherosclerotic heart disease of native coronary artery without angina pectoris: Secondary | ICD-10-CM

## 2020-01-19 DIAGNOSIS — E119 Type 2 diabetes mellitus without complications: Secondary | ICD-10-CM

## 2020-01-19 DIAGNOSIS — I10 Essential (primary) hypertension: Secondary | ICD-10-CM | POA: Diagnosis not present

## 2020-01-19 DIAGNOSIS — I2584 Coronary atherosclerosis due to calcified coronary lesion: Secondary | ICD-10-CM

## 2020-01-19 DIAGNOSIS — E782 Mixed hyperlipidemia: Secondary | ICD-10-CM | POA: Diagnosis not present

## 2020-01-19 DIAGNOSIS — I890 Lymphedema, not elsewhere classified: Secondary | ICD-10-CM

## 2020-01-19 NOTE — Patient Instructions (Signed)

## 2020-01-21 DIAGNOSIS — L501 Idiopathic urticaria: Secondary | ICD-10-CM | POA: Diagnosis not present

## 2020-01-22 ENCOUNTER — Other Ambulatory Visit: Payer: Self-pay | Admitting: Obstetrics & Gynecology

## 2020-01-22 DIAGNOSIS — Z1231 Encounter for screening mammogram for malignant neoplasm of breast: Secondary | ICD-10-CM

## 2020-01-28 ENCOUNTER — Ambulatory Visit: Payer: Medicare Other | Attending: Internal Medicine

## 2020-01-28 DIAGNOSIS — Z23 Encounter for immunization: Secondary | ICD-10-CM | POA: Insufficient documentation

## 2020-01-28 NOTE — Progress Notes (Signed)
   Covid-19 Vaccination Clinic  Name:  Alexandra Williams    MRN: [id number] DOB: 23-Jan-1941  01/28/2020  [name] was observed post Covid-19 immunization for 15 minutes without incidence. She was provided with Vaccine Information Sheet and instruction to access the V-Safe system.   [name] was instructed to call 911 with any severe reactions post vaccine: Marland Kitchen Difficulty breathing  . Swelling of your face and throat  . A fast heartbeat  . A bad rash all over your body  . Dizziness and weakness    Immunizations Administered    Name Date Dose VIS Date Route   Pfizer COVID-19 Vaccine 01/28/2020  4:37 PM 0.3 mL 11/28/2019 Intramuscular   Manufacturer: [place]   Lot: [id number]   [place]: [id number]

## 2020-02-04 ENCOUNTER — Other Ambulatory Visit: Payer: Self-pay

## 2020-02-04 MED ORDER — AMLODIPINE BESYLATE 10 MG PO TABS: 10.0000 mg | ORAL_TABLET | Freq: Every day | ORAL | 3 refills | Status: DC

## 2020-02-20 DIAGNOSIS — Z7984 Long term (current) use of oral hypoglycemic drugs: Secondary | ICD-10-CM | POA: Diagnosis not present

## 2020-02-20 DIAGNOSIS — G4700 Insomnia, unspecified: Secondary | ICD-10-CM | POA: Diagnosis not present

## 2020-02-20 DIAGNOSIS — I10 Essential (primary) hypertension: Secondary | ICD-10-CM | POA: Diagnosis not present

## 2020-02-20 DIAGNOSIS — E7800 Pure hypercholesterolemia, unspecified: Secondary | ICD-10-CM | POA: Diagnosis not present

## 2020-02-20 DIAGNOSIS — E119 Type 2 diabetes mellitus without complications: Secondary | ICD-10-CM | POA: Diagnosis not present

## 2020-03-04 ENCOUNTER — Other Ambulatory Visit: Payer: Self-pay

## 2020-03-04 ENCOUNTER — Ambulatory Visit
Admission: RE | Admit: 2020-03-04 | Discharge: 2020-03-04 | Disposition: A | Discharge status: Home / Self Care | Payer: Medicare Other | Source: Ambulatory Visit | Attending: Obstetrics & Gynecology | Admitting: Obstetrics & Gynecology

## 2020-03-04 DIAGNOSIS — Z1231 Encounter for screening mammogram for malignant neoplasm of breast: Secondary | ICD-10-CM

## 2020-03-11 DIAGNOSIS — E119 Type 2 diabetes mellitus without complications: Secondary | ICD-10-CM | POA: Diagnosis not present

## 2020-03-11 DIAGNOSIS — H2513 Age-related nuclear cataract, bilateral: Secondary | ICD-10-CM | POA: Diagnosis not present

## 2020-03-11 DIAGNOSIS — H524 Presbyopia: Secondary | ICD-10-CM | POA: Diagnosis not present

## 2020-03-11 DIAGNOSIS — H5203 Hypermetropia, bilateral: Secondary | ICD-10-CM | POA: Diagnosis not present

## 2020-03-26 DIAGNOSIS — L501 Idiopathic urticaria: Secondary | ICD-10-CM | POA: Diagnosis not present

## 2020-03-29 DIAGNOSIS — Z7984 Long term (current) use of oral hypoglycemic drugs: Secondary | ICD-10-CM | POA: Diagnosis not present

## 2020-03-29 DIAGNOSIS — G4700 Insomnia, unspecified: Secondary | ICD-10-CM | POA: Diagnosis not present

## 2020-03-29 DIAGNOSIS — I10 Essential (primary) hypertension: Secondary | ICD-10-CM | POA: Diagnosis not present

## 2020-03-29 DIAGNOSIS — E7800 Pure hypercholesterolemia, unspecified: Secondary | ICD-10-CM | POA: Diagnosis not present

## 2020-03-29 DIAGNOSIS — E119 Type 2 diabetes mellitus without complications: Secondary | ICD-10-CM | POA: Diagnosis not present

## 2020-03-30 ENCOUNTER — Other Ambulatory Visit: Payer: Self-pay

## 2020-03-30 MED ORDER — ATORVASTATIN CALCIUM 40 MG PO TABS: ORAL_TABLET | ORAL | 3 refills | Status: DC

## 2020-05-21 DIAGNOSIS — L501 Idiopathic urticaria: Secondary | ICD-10-CM | POA: Diagnosis not present

## 2020-06-11 DIAGNOSIS — G4700 Insomnia, unspecified: Secondary | ICD-10-CM | POA: Diagnosis not present

## 2020-06-11 DIAGNOSIS — E119 Type 2 diabetes mellitus without complications: Secondary | ICD-10-CM | POA: Diagnosis not present

## 2020-06-11 DIAGNOSIS — I10 Essential (primary) hypertension: Secondary | ICD-10-CM | POA: Diagnosis not present

## 2020-06-11 DIAGNOSIS — E7800 Pure hypercholesterolemia, unspecified: Secondary | ICD-10-CM | POA: Diagnosis not present

## 2020-06-29 NOTE — Progress Notes (Signed)
79 y.o. G42P0003 Widowed White or Caucasian female here for annual exam.  The past year is very hard especially due to lack of being able to socialize.  She continues to care for her adult daughter, Lattie Haw.  She's decided to move to [facility].  The building is going to be ready in 2023.  She was just offered an available apartment and they would help her move in 2023.  She's trying to decide what to do.    Had RLQ pain last year.  Saw Dr. Watt Climes.  CT was done showing lesion on pancreas.  Follow up MRI was done.  Repeat 6 months recommended.  She will follow up with Dr. Watt Climes at end of summer.    Patient's last menstrual period was 12/18/2000.          Sexually active: No.  The current method of family planning is post menopausal status.    Exercising: No.  exercise Smoker:  no  Health Maintenance: Pap:  2013 neg History of abnormal Pap:  yes MMG:  03-04-2020 category c density birads 1:neg Colonoscopy:  2015 f/u 27yrs.  Saw Dr. Watt Climes last year.   BMD:   01-28-16 osteopenia TDaP:  UTD Pneumonia vaccine(s):  completed Shingrix:   Completed 2019 Hep C testing: not done Screening Labs: done with Dr. Radene Ou.  Last appt was last week   reports that she has never smoked. She has never used smokeless tobacco. She reports that she does not drink alcohol and does not use drugs.  Past Medical History:  Diagnosis Date  . Abnormal Pap smear 4/82   colpo/ecc negative  . Arthritis    knee  . Cervical polyp 1/99  . Colon polyps   . Diabetes mellitus without complication (HCC)    pre-diabetes  . Elevated lipids   . Fibrocystic breast   . Hives    undetermined-on 7 antihistamines  . Hyperlipidemia   . Hypertension   . Postmenopausal bleeding 3/99   on HRT/endometrial biopsy-focal simple hyperplasia  . Uterine fibroid   . Vasculitis (HCC)    bilat legs  . Vitamin D deficiency     Past Surgical History:  Procedure Laterality Date  . BREAST BIOPSY Left   . INCISION AND DRAINAGE      Bartholin cyst  . TONSILLECTOMY AND ADENOIDECTOMY     as a child    Current Outpatient Medications  Medication Sig Dispense Refill  . amLODipine (NORVASC) 10 MG tablet Take 1 tablet (10 mg total) by mouth daily. 90 tablet 3  . aspirin 81 MG tablet Take 81 mg by mouth daily.    Marland Kitchen atorvastatin (LIPITOR) 40 MG tablet TAKE 1 TABLET BY MOUTH EVERY DAY. 90 tablet 3  . hydroxychloroquine (PLAQUENIL) 200 MG tablet Take 200 mg by mouth daily.     Marland Kitchen loratadine (CLARITIN) 10 MG tablet Take 1 tablet by mouth every evening.    Marland Kitchen losartan (COZAAR) 100 MG tablet Take 100 mg by mouth daily.    . metFORMIN (GLUCOPHAGE-XR) 500 MG 24 hr tablet Take 500 mg by mouth daily.  0  . metoprolol succinate (TOPROL-XL) 100 MG 24 hr tablet Take 100 mg by mouth daily.    . Omalizumab (XOLAIR [place]) Inject into the skin every 14 (fourteen) days.     [name] DELICA LANCETS 97L MISC   1  . ONETOUCH VERIO test strip   1  . zolpidem (AMBIEN) 5 MG tablet TAKE 1 TABLET BY MOUTH EVERY DAY AT BEDTIME 30 tablet 5  .  EPIPEN 2-PAK 0.3 MG/0.3ML SOAJ Inject 0.3 mg into the muscle as needed (HIVES).  (Patient not taking: Reported on 07/02/2020)     No current facility-administered medications for this visit.    Family History  Problem Relation Age of Onset  . Pancreatic cancer Mother   . Cancer Mother   . Diabetes Mother   . Hyperlipidemia Mother   . Hypertension Mother   . Heart attack Father   . Ulcers Father   . Heart disease Father   . Kidney disease Father   . Cancer Father   . Hypertension Father   . Mental retardation Daughter   . Cancer Paternal Aunt        mouth    Review of Systems  Constitutional: Negative.   HENT: Negative.   Eyes: Negative.   Respiratory: Negative.   Cardiovascular: Negative.   Gastrointestinal: Negative.   Endocrine: Negative.   Genitourinary: Negative.   Musculoskeletal: Negative.   Skin: Negative.   Allergic/Immunologic: Negative.   Neurological: Negative.   Hematological:  Negative.   Psychiatric/Behavioral: Negative.     Exam:   BP 122/80   Pulse 68   Resp 16   Ht 4' 10.75" (1.492 m)   Wt 232 lb (105.2 kg)   LMP 12/18/2000   BMI 47.26 kg/m   Height: 4' 10.75" (149.2 cm)  General appearance: alert, cooperative and appears stated age Head: Normocephalic, without obvious abnormality, atraumatic Neck: no adenopathy, supple, symmetrical, trachea midline and thyroid normal to inspection and palpation Lungs: clear to auscultation bilaterally Breasts: normal appearance, no masses or tenderness Heart: regular rate and rhythm Abdomen: soft, non-tender; bowel sounds normal; no masses,  no organomegaly Extremities: extremities normal, atraumatic, no cyanosis or edema Skin: Skin color, texture, turgor normal. No rashes or lesions Lymph nodes: Cervical, supraclavicular, and axillary nodes normal. No abnormal inguinal nodes palpated Neurologic: Grossly normal   Pelvic: External genitalia:  no lesions              Urethra:  normal appearing urethra with no masses, tenderness or lesions              Bartholins and Skenes: normal                 Vagina: normal appearing vagina with normal color and discharge, no lesions              Cervix: no lesions              Pap taken: No. Bimanual Exam:  Uterus:  normal size, contour, position, consistency, mobility, non-tender              Adnexa: no mass, fullness, tenderness               Rectovaginal: Confirms               Anus:  normal sphincter tone, no lesions  Chaperone, Royal Hawthorn, CMA, was present for exam.  A:  Well Woman with normal exam PMP, no HRT Obesity, BMI 47 Chronic LE lymphedema Elevated lipids Type 2 DM Osteoarthritis  P:   Mammogram guidelines reveiwed pap smear not indicated based on age and hx On Ambien.  Does not need RF. Dr. Radene Ou follows and she had recent lab work Vaccines reviewed BMD order placed for next year Return annually or prn

## 2020-07-02 ENCOUNTER — Encounter: Payer: Self-pay | Admitting: Obstetrics & Gynecology

## 2020-07-02 ENCOUNTER — Ambulatory Visit (INDEPENDENT_AMBULATORY_CARE_PROVIDER_SITE_OTHER): Payer: Medicare Other | Admitting: Obstetrics & Gynecology

## 2020-07-02 ENCOUNTER — Other Ambulatory Visit: Payer: Self-pay

## 2020-07-02 VITALS — BP 122/80 | HR 68 | Resp 16 | Ht 58.75 in | Wt 232.0 lb

## 2020-07-02 DIAGNOSIS — M8580 Other specified disorders of bone density and structure, unspecified site: Secondary | ICD-10-CM

## 2020-07-02 DIAGNOSIS — Z124 Encounter for screening for malignant neoplasm of cervix: Secondary | ICD-10-CM | POA: Diagnosis not present

## 2020-07-02 DIAGNOSIS — M8588 Other specified disorders of bone density and structure, other site: Secondary | ICD-10-CM | POA: Diagnosis not present

## 2020-07-02 DIAGNOSIS — Z01419 Encounter for gynecological examination (general) (routine) without abnormal findings: Secondary | ICD-10-CM | POA: Diagnosis not present

## 2020-07-05 ENCOUNTER — Other Ambulatory Visit: Payer: Self-pay | Admitting: Obstetrics & Gynecology

## 2020-07-05 DIAGNOSIS — Z01419 Encounter for gynecological examination (general) (routine) without abnormal findings: Secondary | ICD-10-CM

## 2020-07-05 NOTE — Telephone Encounter (Signed)
Medication refill request: Zolpidem Last AEX:  07/02/20 SM Next AEX: none Last MMG (if hormonal medication request): n/a Refill authorized: today, please advise

## 2020-07-22 DIAGNOSIS — L501 Idiopathic urticaria: Secondary | ICD-10-CM | POA: Diagnosis not present

## 2020-08-22 DIAGNOSIS — H00011 Hordeolum externum right upper eyelid: Secondary | ICD-10-CM | POA: Diagnosis not present

## 2020-08-27 DIAGNOSIS — H5711 Ocular pain, right eye: Secondary | ICD-10-CM | POA: Diagnosis not present

## 2020-08-27 DIAGNOSIS — H01001 Unspecified blepharitis right upper eyelid: Secondary | ICD-10-CM | POA: Diagnosis not present

## 2020-08-27 DIAGNOSIS — H0011 Chalazion right upper eyelid: Secondary | ICD-10-CM | POA: Diagnosis not present

## 2020-09-03 DIAGNOSIS — G4700 Insomnia, unspecified: Secondary | ICD-10-CM | POA: Diagnosis not present

## 2020-09-03 DIAGNOSIS — I10 Essential (primary) hypertension: Secondary | ICD-10-CM | POA: Diagnosis not present

## 2020-09-03 DIAGNOSIS — E119 Type 2 diabetes mellitus without complications: Secondary | ICD-10-CM | POA: Diagnosis not present

## 2020-09-03 DIAGNOSIS — E7800 Pure hypercholesterolemia, unspecified: Secondary | ICD-10-CM | POA: Diagnosis not present

## 2020-09-03 DIAGNOSIS — E782 Mixed hyperlipidemia: Secondary | ICD-10-CM | POA: Diagnosis not present

## 2020-09-06 DIAGNOSIS — H25811 Combined forms of age-related cataract, right eye: Secondary | ICD-10-CM | POA: Diagnosis not present

## 2020-09-06 DIAGNOSIS — H0011 Chalazion right upper eyelid: Secondary | ICD-10-CM | POA: Diagnosis not present

## 2020-09-08 DIAGNOSIS — I10 Essential (primary) hypertension: Secondary | ICD-10-CM | POA: Diagnosis not present

## 2020-09-08 DIAGNOSIS — E7800 Pure hypercholesterolemia, unspecified: Secondary | ICD-10-CM | POA: Diagnosis not present

## 2020-09-08 DIAGNOSIS — E119 Type 2 diabetes mellitus without complications: Secondary | ICD-10-CM | POA: Diagnosis not present

## 2020-09-08 DIAGNOSIS — I890 Lymphedema, not elsewhere classified: Secondary | ICD-10-CM | POA: Diagnosis not present

## 2020-09-08 DIAGNOSIS — Z0000 Encounter for general adult medical examination without abnormal findings: Secondary | ICD-10-CM | POA: Diagnosis not present

## 2020-09-08 DIAGNOSIS — L501 Idiopathic urticaria: Secondary | ICD-10-CM | POA: Diagnosis not present

## 2020-09-08 DIAGNOSIS — Z23 Encounter for immunization: Secondary | ICD-10-CM | POA: Diagnosis not present

## 2020-09-08 DIAGNOSIS — G4700 Insomnia, unspecified: Secondary | ICD-10-CM | POA: Diagnosis not present

## 2020-09-24 DIAGNOSIS — Z23 Encounter for immunization: Secondary | ICD-10-CM | POA: Diagnosis not present

## 2020-10-07 DIAGNOSIS — H2513 Age-related nuclear cataract, bilateral: Secondary | ICD-10-CM | POA: Diagnosis not present

## 2020-11-10 DIAGNOSIS — L501 Idiopathic urticaria: Secondary | ICD-10-CM | POA: Diagnosis not present

## 2020-11-10 DIAGNOSIS — J3089 Other allergic rhinitis: Secondary | ICD-10-CM | POA: Diagnosis not present

## 2020-11-10 DIAGNOSIS — T783XXD Angioneurotic edema, subsequent encounter: Secondary | ICD-10-CM | POA: Diagnosis not present

## 2020-11-10 DIAGNOSIS — J301 Allergic rhinitis due to pollen: Secondary | ICD-10-CM | POA: Diagnosis not present

## 2020-11-16 DIAGNOSIS — G4700 Insomnia, unspecified: Secondary | ICD-10-CM | POA: Diagnosis not present

## 2020-11-16 DIAGNOSIS — E119 Type 2 diabetes mellitus without complications: Secondary | ICD-10-CM | POA: Diagnosis not present

## 2020-11-16 DIAGNOSIS — I10 Essential (primary) hypertension: Secondary | ICD-10-CM | POA: Diagnosis not present

## 2020-11-16 DIAGNOSIS — E782 Mixed hyperlipidemia: Secondary | ICD-10-CM | POA: Diagnosis not present

## 2020-11-16 DIAGNOSIS — E7800 Pure hypercholesterolemia, unspecified: Secondary | ICD-10-CM | POA: Diagnosis not present

## 2020-11-16 DIAGNOSIS — K219 Gastro-esophageal reflux disease without esophagitis: Secondary | ICD-10-CM | POA: Diagnosis not present

## 2020-11-25 DIAGNOSIS — Z23 Encounter for immunization: Secondary | ICD-10-CM | POA: Diagnosis not present

## 2020-12-27 DIAGNOSIS — E119 Type 2 diabetes mellitus without complications: Secondary | ICD-10-CM | POA: Diagnosis not present

## 2020-12-27 DIAGNOSIS — L501 Idiopathic urticaria: Secondary | ICD-10-CM | POA: Diagnosis not present

## 2020-12-27 DIAGNOSIS — E7800 Pure hypercholesterolemia, unspecified: Secondary | ICD-10-CM | POA: Diagnosis not present

## 2020-12-27 DIAGNOSIS — I10 Essential (primary) hypertension: Secondary | ICD-10-CM | POA: Diagnosis not present

## 2020-12-27 DIAGNOSIS — H269 Unspecified cataract: Secondary | ICD-10-CM | POA: Diagnosis not present

## 2020-12-27 DIAGNOSIS — G4700 Insomnia, unspecified: Secondary | ICD-10-CM | POA: Diagnosis not present

## 2021-01-05 ENCOUNTER — Other Ambulatory Visit: Payer: Self-pay | Admitting: Obstetrics & Gynecology

## 2021-01-05 DIAGNOSIS — Z1231 Encounter for screening mammogram for malignant neoplasm of breast: Secondary | ICD-10-CM

## 2021-01-17 NOTE — Progress Notes (Unsigned)
Cardiology Office Note   Date:  01/19/2021   ID:  Alexandra Williams, Alexandra Williams 08/24/1941, MRN [id number]  PCP:  Aretta Nip, MD    No chief complaint on file.  Coronary calcification  Wt Readings from Last 3 Encounters:  01/19/21 232 lb (105.2 kg)  07/02/20 232 lb (105.2 kg)  01/19/20 237 lb (107.5 kg)       History of Present Illness: Alexandra Williams is a 80 y.o. female  Who has had difficult to control hypertension. SHe has had hyperlipidemia as well.   Her husband was apatientof mine, but he passed away in 04/28/2016 after developing ESRD.  She has had chronic edema in her LE as well, thought to be lymphedema.SOme days are worse than others. She uses an inflation device to help with compression.SHe uses compression stockings and tries to elevate legs as well.   She hashadanxiety at thought that she is responsible for her special needs daughter. Shewas looking for a program that she could transition to.  In 2018, the patient had an abdominal CT. Cardiomegaly was noted. There were coronary artery calcifications as well.  In the past, it was noted: "Coronary calcification:No symptoms of angina. I encouraged her to try to be more active. We went over risk factor modification including healthy diet, more exercise, watching blood pressure and cholesterol. LDL 64 in June 2019. She has atrial enlargement and RV enlargement noted on echo in 2018. No signs of left-sided congestive heart failure. No further testing needed at this time. She will let us know she has any worsening symptoms."  The 2018 echocardiogram showed: Left ventricle: The cavity size was normal. Systolic function was normal. The estimated ejection fraction was in the range of 55% to 60%. Wall motion was normal; there were no regional wall motion abnormalities. Features are consistent with a pseudonormal left ventricular filling pattern, with concomitant abnormal relaxation and  increased filling pressure (grade 2 diastolic dysfunction). - Mitral valve: There was mild regurgitation. - Left atrium: The atrium was mildly dilated. - Right ventricle: The cavity size was mildly dilated. Wall thickness was normal. - Tricuspid valve: There was trivial regurgitation.  Impressions:  - Normal LVF with EF 55-60%, mild MR, grade 2DD, mild LAE, mildly dilated RV and trivial TR.  Dilated atria and mildly dilated RV were noted.  Abdominal CT was done for abdominal pain. Her son is a Art therapist from [place] and he helped treat her as well. She feels better.  She has a dry cough. She thinks it was allergies. Lisinopril was changed years ago.   In 08/2020, she moved to [place] with her daughter.  She sld her house.  She has access to exercise classes.  THey have exercise equipment and a pool.  THese have been open intermittently.   Denies : Chest pain. Dizziness.  Nitroglycerin use. Orthopnea. Palpitations. Paroxysmal nocturnal dyspnea.  Syncope.   Still has the lymphedema.  Some days are better than others. Elevating legs and using compression.   Home BP readings in the 140-150 range.   Past Medical History:  Diagnosis Date  . Abnormal Pap smear 4/82   colpo/ecc negative  . Arthritis    knee  . Cervical polyp 1/99  . Colon polyps   . Diabetes mellitus without complication (HCC)    pre-diabetes  . Fibrocystic breast   . Hives    undetermined-on 7 antihistamines  . Hyperlipidemia   . Hypertension   . Postmenopausal bleeding 3/99   on HRT/endometrial  biopsy-focal simple hyperplasia  . Uterine fibroid   . Vasculitis (HCC)    bilat legs  . Vitamin D deficiency     Past Surgical History:  Procedure Laterality Date  . BREAST BIOPSY Left   . INCISION AND DRAINAGE     Bartholin cyst  . TONSILLECTOMY AND ADENOIDECTOMY     as a child     Current Outpatient Medications  Medication Sig Dispense Refill  . amLODipine (NORVASC) 10 MG tablet  Take 1 tablet (10 mg total) by mouth daily. 90 tablet 3  . aspirin 81 MG tablet Take 81 mg by mouth daily.    Marland Kitchen atorvastatin (LIPITOR) 40 MG tablet TAKE 1 TABLET BY MOUTH EVERY DAY. 90 tablet 3  . Difluprednate 0.05 % EMUL in the morning, at noon, in the evening, and at bedtime.    Marland Kitchen EPIPEN 2-PAK 0.3 MG/0.3ML SOAJ Inject 0.3 mg into the muscle as needed (HIVES).    . hydroxychloroquine (PLAQUENIL) 200 MG tablet Take 200 mg by mouth daily.     Marland Kitchen loratadine (CLARITIN) 10 MG tablet Take 1 tablet by mouth every evening.    Marland Kitchen losartan (COZAAR) 100 MG tablet Take 100 mg by mouth daily.    . metFORMIN (GLUCOPHAGE-XR) 500 MG 24 hr tablet Take 500 mg by mouth in the morning and at bedtime.  0  . metoprolol succinate (TOPROL-XL) 100 MG 24 hr tablet Take 100 mg by mouth daily.    Marland Kitchen moxifloxacin (VIGAMOX) 0.5 % ophthalmic solution Apply to eye in the morning, at noon, in the evening, and at bedtime.    . Omalizumab (XOLAIR [place]) Inject into the skin every 14 (fourteen) days.     [name] DELICA LANCETS 45G MISC   1  . ONETOUCH VERIO test strip   1  . zolpidem (AMBIEN) 5 MG tablet TAKE 1 TABLET BY MOUTH EVERY DAY AT BEDTIME 30 tablet 5   No current facility-administered medications for this visit.    Allergies:   Codeine, Peanuts [peanut oil], Shellfish allergy, Sulfites, Tessalon [benzonatate], Hylan g-f 20, and Elemental sulfur    Social History:  The patient  reports that she has never smoked. She has never used smokeless tobacco. She reports that she does not drink alcohol and does not use drugs.   Family History:  The patient's family history includes Cancer in her father, mother, and paternal aunt; Diabetes in her mother; Heart attack in her father; Heart disease in her father; Hyperlipidemia in her mother; Hypertension in her father and mother; Kidney disease in her father; Mental retardation in her daughter; Pancreatic cancer in her mother; Ulcers in her father.    ROS:  Please see the history  of present illness.   Otherwise, review of systems are positive for occasional cough, postnasal drip.   All other systems are reviewed and negative.    PHYSICAL EXAM: VS:  BP (!) 144/90   Pulse 60   Ht 4\' 11"  (1.499 m)   Wt 232 lb (105.2 kg)   LMP 12/18/2000   SpO2 96%   BMI 46.86 kg/m  , BMI Body mass index is 46.86 kg/m. GEN: Well nourished, well developed, in no acute distress  HEENT: normal  Neck: no JVD, carotid bruits, or masses Cardiac: RRR; no murmurs, rubs, or gallops,; bilateral LE nonpitting edema  Respiratory:  clear to auscultation bilaterally, normal work of breathing GI: soft, nontender, nondistended, + BS MS: no deformity or atrophy  Skin: skin changes in th elegs Neuro:  Strength  and sensation are intact Psych: euthymic mood, full affect   EKG:   The ekg ordered today demonstrates NSR, no ST segment   Recent Labs: No results found for requested labs within last 8760 hours.   Lipid Panel No results found for: CHOL, TRIG, HDL, CHOLHDL, VLDL, LDLCALC, LDLDIRECT   Other studies Reviewed: Additional studies/ records that were reviewed today with results demonstrating: labs reviewed LDL 70.  HDL 46.   ASSESSMENT AND PLAN:  1. HTN: Stop Toprol.  Start Coreg 12.5 mg BID. Will see if this gives more BP lowering effect. SHe will check at home and call in some readings to our office, or send through mychart. Decrease salt in the diet.  2. Lymphedema: COntinue leg elevation.  3. Hyperlipidemia: The current medical regimen is effective;  continue present plan and medications. 4. DM: A1C 7.0.  Whole food, plant based diet.  5. Coronary calcification: No angina.    Current medicines are reviewed at length with the patient today.  The patient concerns regarding her medicines were addressed.  The following changes have been made:  No change  Labs/ tests ordered today include:  No orders of the defined types were placed in this encounter.   Recommend 150  minutes/week of aerobic exercise Low fat, low carb, high fiber diet recommended  Disposition:   FU in 1 year- or sooner if BP gets difficult to control   Signed, Larae Grooms, MD  01/19/2021 1:33 PM    [facility] [address], [place], [place]  [address] Phone: [phone]; Fax: [phone]

## 2021-01-19 ENCOUNTER — Ambulatory Visit (INDEPENDENT_AMBULATORY_CARE_PROVIDER_SITE_OTHER): Payer: Medicare Other | Admitting: Interventional Cardiology

## 2021-01-19 ENCOUNTER — Other Ambulatory Visit: Payer: Self-pay

## 2021-01-19 ENCOUNTER — Encounter: Payer: Self-pay | Admitting: Interventional Cardiology

## 2021-01-19 VITALS — BP 144/90 | HR 60 | Ht 59.0 in | Wt 232.0 lb

## 2021-01-19 DIAGNOSIS — I890 Lymphedema, not elsewhere classified: Secondary | ICD-10-CM

## 2021-01-19 DIAGNOSIS — E119 Type 2 diabetes mellitus without complications: Secondary | ICD-10-CM | POA: Diagnosis not present

## 2021-01-19 DIAGNOSIS — E782 Mixed hyperlipidemia: Secondary | ICD-10-CM

## 2021-01-19 DIAGNOSIS — I2510 Atherosclerotic heart disease of native coronary artery without angina pectoris: Secondary | ICD-10-CM

## 2021-01-19 DIAGNOSIS — I10 Essential (primary) hypertension: Secondary | ICD-10-CM

## 2021-01-19 DIAGNOSIS — I2584 Coronary atherosclerosis due to calcified coronary lesion: Secondary | ICD-10-CM

## 2021-01-19 MED ORDER — CARVEDILOL 12.5 MG PO TABS
12.5000 mg | ORAL_TABLET | Freq: Two times a day (BID) | ORAL | 3 refills | Status: DC
Start: 2021-01-19 — End: 2021-02-18

## 2021-01-19 NOTE — Patient Instructions (Signed)
Medication Instructions:  Your physician has recommended you make the following change in your medication:  1.) STOP:  Metoprolol   2.) START:  Carvedilol (Coreg) 12.5mg  twice daily  *If you need a refill on your cardiac medications before your next appointment, please call your pharmacy*   Lab Work: NONE If you have labs (blood work) drawn today and your tests are completely normal, you will receive your results only by: Marland Kitchen MyChart Message (if you have MyChart) OR . A paper copy in the mail If you have any lab test that is abnormal or we need to change your treatment, we will call you to review the results.   Testing/Procedures: NONE   Follow-Up: At [facility], you and your health needs are our priority.  As part of our continuing mission to provide you with exceptional heart care, we have created designated Provider Care Teams.  These Care Teams include your primary Cardiologist (physician) and Advanced Practice Providers (APPs -  Physician Assistants and Nurse Practitioners) who all work together to provide you with the care you need, when you need it.  We recommend signing up for the patient portal called "MyChart".  Sign up information is provided on this After Visit Summary.  MyChart is used to connect with patients for Virtual Visits (Telemedicine).  Patients are able to view lab/test results, encounter notes, upcoming appointments, etc.  Non-urgent messages can be sent to your provider as well.   To learn more about what you can do with MyChart, go to [email or web address].    Your next appointment:   12 month(s)  The format for your next appointment:   In Person  Provider:   You may see Larae Grooms, MD or one of the following Advanced Practice Providers on your designated Care Team:    Melina Copa, PA-C  Ermalinda Barrios, PA-C    Other Instructions Please send 7 days of Blood pressures readings through [facility]

## 2021-01-25 DIAGNOSIS — H2511 Age-related nuclear cataract, right eye: Secondary | ICD-10-CM | POA: Diagnosis not present

## 2021-01-25 DIAGNOSIS — H25811 Combined forms of age-related cataract, right eye: Secondary | ICD-10-CM | POA: Diagnosis not present

## 2021-02-09 ENCOUNTER — Other Ambulatory Visit (HOSPITAL_BASED_OUTPATIENT_CLINIC_OR_DEPARTMENT_OTHER): Payer: Self-pay | Admitting: Obstetrics & Gynecology

## 2021-02-09 ENCOUNTER — Telehealth (HOSPITAL_BASED_OUTPATIENT_CLINIC_OR_DEPARTMENT_OTHER): Payer: Self-pay | Admitting: *Deleted

## 2021-02-09 DIAGNOSIS — Z01419 Encounter for gynecological examination (general) (routine) without abnormal findings: Secondary | ICD-10-CM

## 2021-02-09 MED ORDER — ZOLPIDEM TARTRATE 5 MG PO TABS: 5.0000 mg | ORAL_TABLET | Freq: Every day | ORAL | 5 refills | Status: DC

## 2021-02-09 NOTE — Telephone Encounter (Signed)
Rx completed.  Please let her know.  Thanks.

## 2021-02-09 NOTE — Telephone Encounter (Signed)
Pt called requesting a refill on ambien. She states that she has an appt with Dr Sabra Heck in July.  She states another provider gave her a one month refill but it will be running out soon.

## 2021-02-09 NOTE — Telephone Encounter (Signed)
DOB verified. Informed pt that refill had been sent for ambien.

## 2021-02-14 DIAGNOSIS — G4700 Insomnia, unspecified: Secondary | ICD-10-CM | POA: Diagnosis not present

## 2021-02-14 DIAGNOSIS — H269 Unspecified cataract: Secondary | ICD-10-CM | POA: Diagnosis not present

## 2021-02-14 DIAGNOSIS — I10 Essential (primary) hypertension: Secondary | ICD-10-CM | POA: Diagnosis not present

## 2021-02-14 DIAGNOSIS — K219 Gastro-esophageal reflux disease without esophagitis: Secondary | ICD-10-CM | POA: Diagnosis not present

## 2021-02-14 DIAGNOSIS — E119 Type 2 diabetes mellitus without complications: Secondary | ICD-10-CM | POA: Diagnosis not present

## 2021-02-14 DIAGNOSIS — E7800 Pure hypercholesterolemia, unspecified: Secondary | ICD-10-CM | POA: Diagnosis not present

## 2021-02-14 DIAGNOSIS — E782 Mixed hyperlipidemia: Secondary | ICD-10-CM | POA: Diagnosis not present

## 2021-02-18 MED ORDER — CARVEDILOL 25 MG PO TABS
25.0000 mg | ORAL_TABLET | Freq: Two times a day (BID) | ORAL | 3 refills | Status: DC
Start: 2021-02-18 — End: 2022-02-20

## 2021-02-21 DIAGNOSIS — L989 Disorder of the skin and subcutaneous tissue, unspecified: Secondary | ICD-10-CM | POA: Diagnosis not present

## 2021-02-28 DIAGNOSIS — H269 Unspecified cataract: Secondary | ICD-10-CM | POA: Diagnosis not present

## 2021-02-28 DIAGNOSIS — E782 Mixed hyperlipidemia: Secondary | ICD-10-CM | POA: Diagnosis not present

## 2021-02-28 DIAGNOSIS — G4700 Insomnia, unspecified: Secondary | ICD-10-CM | POA: Diagnosis not present

## 2021-02-28 DIAGNOSIS — I10 Essential (primary) hypertension: Secondary | ICD-10-CM | POA: Diagnosis not present

## 2021-02-28 DIAGNOSIS — E7800 Pure hypercholesterolemia, unspecified: Secondary | ICD-10-CM | POA: Diagnosis not present

## 2021-02-28 DIAGNOSIS — E119 Type 2 diabetes mellitus without complications: Secondary | ICD-10-CM | POA: Diagnosis not present

## 2021-02-28 DIAGNOSIS — K219 Gastro-esophageal reflux disease without esophagitis: Secondary | ICD-10-CM | POA: Diagnosis not present

## 2021-03-07 ENCOUNTER — Inpatient Hospital Stay: Admission: RE | Admit: 2021-03-07 | Payer: Medicare Other | Source: Ambulatory Visit

## 2021-03-08 DIAGNOSIS — H25812 Combined forms of age-related cataract, left eye: Secondary | ICD-10-CM | POA: Diagnosis not present

## 2021-03-08 DIAGNOSIS — H2512 Age-related nuclear cataract, left eye: Secondary | ICD-10-CM | POA: Diagnosis not present

## 2021-03-14 ENCOUNTER — Other Ambulatory Visit: Payer: Self-pay

## 2021-03-14 MED ORDER — AMLODIPINE BESYLATE 10 MG PO TABS: 10.0000 mg | ORAL_TABLET | Freq: Every day | ORAL | 3 refills | Status: DC

## 2021-03-28 DIAGNOSIS — I10 Essential (primary) hypertension: Secondary | ICD-10-CM | POA: Diagnosis not present

## 2021-03-28 DIAGNOSIS — K219 Gastro-esophageal reflux disease without esophagitis: Secondary | ICD-10-CM | POA: Diagnosis not present

## 2021-03-28 DIAGNOSIS — E119 Type 2 diabetes mellitus without complications: Secondary | ICD-10-CM | POA: Diagnosis not present

## 2021-03-28 DIAGNOSIS — H269 Unspecified cataract: Secondary | ICD-10-CM | POA: Diagnosis not present

## 2021-03-28 DIAGNOSIS — G4700 Insomnia, unspecified: Secondary | ICD-10-CM | POA: Diagnosis not present

## 2021-03-28 DIAGNOSIS — E782 Mixed hyperlipidemia: Secondary | ICD-10-CM | POA: Diagnosis not present

## 2021-03-28 DIAGNOSIS — E7800 Pure hypercholesterolemia, unspecified: Secondary | ICD-10-CM | POA: Diagnosis not present

## 2021-03-29 ENCOUNTER — Other Ambulatory Visit: Payer: Self-pay | Admitting: Interventional Cardiology

## 2021-03-30 ENCOUNTER — Other Ambulatory Visit: Payer: Self-pay

## 2021-03-30 MED ORDER — ATORVASTATIN CALCIUM 40 MG PO TABS: 1.0000 | ORAL_TABLET | Freq: Every day | ORAL | 3 refills | Status: DC

## 2021-04-28 ENCOUNTER — Other Ambulatory Visit: Payer: Self-pay

## 2021-04-28 ENCOUNTER — Ambulatory Visit
Admission: RE | Admit: 2021-04-28 | Discharge: 2021-04-28 | Disposition: A | Discharge status: Home / Self Care | Payer: Medicare Other | Source: Ambulatory Visit | Attending: Obstetrics & Gynecology | Admitting: Obstetrics & Gynecology

## 2021-04-28 DIAGNOSIS — Z1231 Encounter for screening mammogram for malignant neoplasm of breast: Secondary | ICD-10-CM | POA: Diagnosis not present

## 2021-05-03 ENCOUNTER — Other Ambulatory Visit: Payer: Self-pay | Admitting: Obstetrics & Gynecology

## 2021-05-03 DIAGNOSIS — M8580 Other specified disorders of bone density and structure, unspecified site: Secondary | ICD-10-CM

## 2021-05-03 DIAGNOSIS — M8588 Other specified disorders of bone density and structure, other site: Secondary | ICD-10-CM

## 2021-05-06 ENCOUNTER — Other Ambulatory Visit: Payer: Medicare Other

## 2021-05-10 ENCOUNTER — Ambulatory Visit: Payer: Medicare Other | Admitting: Physician Assistant

## 2021-05-12 DIAGNOSIS — U071 COVID-19: Secondary | ICD-10-CM | POA: Diagnosis not present

## 2021-05-18 ENCOUNTER — Encounter: Payer: Self-pay | Admitting: Physician Assistant

## 2021-05-18 ENCOUNTER — Other Ambulatory Visit: Payer: Self-pay

## 2021-05-18 ENCOUNTER — Ambulatory Visit (INDEPENDENT_AMBULATORY_CARE_PROVIDER_SITE_OTHER): Payer: Medicare Other | Admitting: Physician Assistant

## 2021-05-18 DIAGNOSIS — R202 Paresthesia of skin: Secondary | ICD-10-CM | POA: Diagnosis not present

## 2021-05-18 DIAGNOSIS — I2584 Coronary atherosclerosis due to calcified coronary lesion: Secondary | ICD-10-CM | POA: Diagnosis not present

## 2021-05-18 DIAGNOSIS — I2510 Atherosclerotic heart disease of native coronary artery without angina pectoris: Secondary | ICD-10-CM

## 2021-05-18 DIAGNOSIS — L570 Actinic keratosis: Secondary | ICD-10-CM

## 2021-05-18 NOTE — Progress Notes (Signed)
   Follow-Up Visit   Subjective  Alexandra Williams is a 80 y.o. female who presents for the following: Skin Problem (Back lesion Dr Jacelyn Grip wanted checked it itches per patient ).    The following portions of the chart were reviewed this encounter and updated as appropriate:  Tobacco  Allergies  Meds  Problems  Med Hx  Surg Hx  Fam Hx      Objective  Well appearing patient in no apparent distress; mood and affect are within normal limits.  A full examination was performed including scalp, head, eyes, ears, nose, lips, neck, chest, axillae, abdomen, back, buttocks, bilateral upper extremities, bilateral lower extremities, hands, feet, fingers, toes, fingernails, and toenails. All findings within normal limits unless otherwise noted below.  Objective  Right Upper Back: Erythematous patches with gritty scale.  Objective  Left Upper Back, Right Upper Back: No skin lesion.  Assessment & Plan  AK (actinic keratosis) Right Upper Back  Destruction of lesion - Right Upper Back Complexity: simple   Destruction method: cryotherapy   Informed consent: discussed and consent obtained   Timeout:  patient name, date of birth, surgical site, and procedure verified Lesion destroyed using liquid nitrogen: Yes   Cryotherapy cycles:  3 Outcome: patient tolerated procedure well with no complications    Notalgia paresthetica (2) Left Upper Back; Right Upper Back  observe   I, Calub Tarnow, PA-C, have reviewed all documentation's for this visit.  The documentation on 05/18/21 for the exam, diagnosis, procedures and orders are all accurate and complete.

## 2021-05-19 ENCOUNTER — Ambulatory Visit: Payer: Medicare Other | Admitting: Physician Assistant

## 2021-07-04 ENCOUNTER — Encounter (HOSPITAL_BASED_OUTPATIENT_CLINIC_OR_DEPARTMENT_OTHER): Payer: Self-pay | Admitting: Obstetrics & Gynecology

## 2021-07-04 ENCOUNTER — Ambulatory Visit (INDEPENDENT_AMBULATORY_CARE_PROVIDER_SITE_OTHER): Payer: Medicare Other | Admitting: Obstetrics & Gynecology

## 2021-07-04 ENCOUNTER — Other Ambulatory Visit: Payer: Self-pay

## 2021-07-04 VITALS — BP 130/63 | HR 60 | Ht 58.5 in | Wt 232.0 lb

## 2021-07-04 DIAGNOSIS — F5101 Primary insomnia: Secondary | ICD-10-CM

## 2021-07-04 DIAGNOSIS — I2510 Atherosclerotic heart disease of native coronary artery without angina pectoris: Secondary | ICD-10-CM

## 2021-07-04 DIAGNOSIS — Z01419 Encounter for gynecological examination (general) (routine) without abnormal findings: Secondary | ICD-10-CM

## 2021-07-04 DIAGNOSIS — E119 Type 2 diabetes mellitus without complications: Secondary | ICD-10-CM

## 2021-07-04 DIAGNOSIS — I890 Lymphedema, not elsewhere classified: Secondary | ICD-10-CM

## 2021-07-04 DIAGNOSIS — Z780 Asymptomatic menopausal state: Secondary | ICD-10-CM | POA: Diagnosis not present

## 2021-07-04 DIAGNOSIS — I2584 Coronary atherosclerosis due to calcified coronary lesion: Secondary | ICD-10-CM

## 2021-07-04 MED ORDER — ZOLPIDEM TARTRATE 5 MG PO TABS: 5.0000 mg | ORAL_TABLET | Freq: Every day | ORAL | 5 refills | Status: DC

## 2021-07-04 NOTE — Progress Notes (Signed)
80 y.o. G6P0003 Widowed White or Caucasian female here for breast and pelvic exam.  She's moved into [place] in September.  Daughter, Lattie Haw, is with her.  Son, Rodman Key, will be Lisa's guardian in the future.  Denies vaginal bleeding.    Denies vaginal bleeding.  I am following her bone density testing.  She is having issues with urinary leakage.  This occurs with pretty much any movement, cough/sneeze, laugh.  Even when she empties her bladder frequently, she will still leak.  Denies any odor.  Incontinence treatments discussed.  Not interested at this time.  Patient's last menstrual period was 12/18/2000.          Sexually active: No.  H/O STD:  no  Health Maintenance: PCP:  Dr. Radene Ou.  Last wellness appt was 02/2020.  Did blood work at that appt:   Vaccines are up to date:  pt aware I don't have her pneumonia vaccination dates or tdap.  Pt knows she's had both pneumonia vaccinations. Colonoscopy:  followed by Dr. Watt Climes.  H/O polyps.  Last colonoscopy was 2015.  She did have polyps MMG:  04/2021 BMD:  scheduled in September Last pap smear:  2013.   H/o abnormal pap smear:  remote hx   reports that she has never smoked. She has never used smokeless tobacco. She reports that she does not drink alcohol and does not use drugs.  Past Medical History:  Diagnosis Date   Abnormal Pap smear 4/82   colpo/ecc negative   Arthritis    knee   Cervical polyp 1/99   Colon polyps    Diabetes mellitus without complication ([place])    pre-diabetes   Fibrocystic breast    Hives    undetermined-on 7 antihistamines   Hyperlipidemia    Hypertension    Postmenopausal bleeding 3/99   on HRT/endometrial biopsy-focal simple hyperplasia   Uterine fibroid    Vasculitis (HCC)    bilat legs   Vitamin D deficiency     Past Surgical History:  Procedure Laterality Date   BREAST BIOPSY Left    INCISION AND DRAINAGE     Bartholin cyst   TONSILLECTOMY AND ADENOIDECTOMY     as a child    Current  Outpatient Medications  Medication Sig Dispense Refill   amLODipine (NORVASC) 10 MG tablet Take 1 tablet (10 mg total) by mouth daily. 90 tablet 3   aspirin 81 MG tablet Take 81 mg by mouth daily.     atorvastatin (LIPITOR) 40 MG tablet Take 1 tablet (40 mg total) by mouth daily. 90 tablet 3   carvedilol (COREG) 25 MG tablet Take 1 tablet (25 mg total) by mouth 2 (two) times daily. 180 tablet 3   EPIPEN 2-PAK 0.3 MG/0.3ML SOAJ Inject 0.3 mg into the muscle as needed (HIVES).     hydroxychloroquine (PLAQUENIL) 200 MG tablet Take 200 mg by mouth daily.      loratadine (CLARITIN) 10 MG tablet Take 1 tablet by mouth every evening.     losartan (COZAAR) 100 MG tablet Take 100 mg by mouth daily.     metFORMIN (GLUCOPHAGE-XR) 500 MG 24 hr tablet Take 500 mg by mouth in the morning and at bedtime.  0   ONETOUCH DELICA LANCETS 34H MISC   1   ONETOUCH VERIO test strip   1   zolpidem (AMBIEN) 5 MG tablet Take 1 tablet (5 mg total) by mouth at bedtime. 30 tablet 5   No current facility-administered medications for this visit.  Family History  Problem Relation Age of Onset   Pancreatic cancer Mother    Cancer Mother    Diabetes Mother    Hyperlipidemia Mother    Hypertension Mother    Heart attack Father    Ulcers Father    Heart disease Father    Kidney disease Father    Cancer Father    Hypertension Father    Mental retardation Daughter    Cancer Paternal Aunt        mouth    Review of Systems  All other systems reviewed and are negative.  Exam:   BP 130/63   Pulse 60   Ht 4' 10.5" (1.486 m)   Wt 232 lb (105.2 kg)   LMP 12/18/2000   BMI 47.66 kg/m   Height: 4' 10.5" (148.6 cm)  General appearance: alert, cooperative and appears stated age Breasts: normal appearance, no masses or tenderness Abdomen: soft, non-tender; bowel sounds normal; no masses,  no organomegaly Lymph nodes: Cervical, supraclavicular, and axillary nodes normal.  No abnormal inguinal nodes  palpated Neurologic: Grossly normal  Pelvic: External genitalia:  no lesions              Urethra:  normal appearing urethra with no masses, tenderness or lesions              Bartholins and Skenes: normal                 Vagina: normal appearing vagina with atrophic changes and no discharge, no lesions              Cervix: no lesions              Pap taken: No. Bimanual Exam:  Uterus:  normal size, contour, position, consistency, mobility, non-tender              Adnexa: normal adnexa and no mass, fullness, tenderness               Rectovaginal: Confirms               Anus:  normal sphincter tone, no lesions  Chaperone, Octaviano Batty, CMA, was present for exam.  Assessment/Plan: 1. Encntr for gyn exam (general) (routine) w/o abn findings - pap smears no longer indicated for screening - MMG done 04/2021 - colonoscopy last done 2015.  Polyps present.  Pt reports when called about follow up was advised it was her decision about repeating and she declined - BMD scheduled in September - vaccines updated - lab work done with Dr. Radene Ou  2. Postmenopausal - no HRT  3. Primary insomnia - pt chronically dependent but does not sleep without and feels very strongly that she needs rest to help care for daughter.  Aware of any risks and desires to continue - zolpidem (AMBIEN) 5 MG tablet; Take 1 tablet (5 mg total) by mouth at bedtime.  Dispense: 30 tablet; Refill: 5  5. Lymphedema - has seen PT in the past  6. Controlled type 2 diabetes mellitus without complication, without long-term current use of insulin ([place])

## 2021-07-05 DIAGNOSIS — Z780 Asymptomatic menopausal state: Secondary | ICD-10-CM | POA: Insufficient documentation

## 2021-07-05 DIAGNOSIS — F5101 Primary insomnia: Secondary | ICD-10-CM | POA: Insufficient documentation

## 2021-07-17 DIAGNOSIS — G4700 Insomnia, unspecified: Secondary | ICD-10-CM | POA: Diagnosis not present

## 2021-07-17 DIAGNOSIS — K219 Gastro-esophageal reflux disease without esophagitis: Secondary | ICD-10-CM | POA: Diagnosis not present

## 2021-07-17 DIAGNOSIS — I10 Essential (primary) hypertension: Secondary | ICD-10-CM | POA: Diagnosis not present

## 2021-07-17 DIAGNOSIS — H269 Unspecified cataract: Secondary | ICD-10-CM | POA: Diagnosis not present

## 2021-07-17 DIAGNOSIS — E782 Mixed hyperlipidemia: Secondary | ICD-10-CM | POA: Diagnosis not present

## 2021-07-17 DIAGNOSIS — E119 Type 2 diabetes mellitus without complications: Secondary | ICD-10-CM | POA: Diagnosis not present

## 2021-07-17 DIAGNOSIS — E7800 Pure hypercholesterolemia, unspecified: Secondary | ICD-10-CM | POA: Diagnosis not present

## 2021-08-12 DIAGNOSIS — E7800 Pure hypercholesterolemia, unspecified: Secondary | ICD-10-CM | POA: Diagnosis not present

## 2021-08-12 DIAGNOSIS — K219 Gastro-esophageal reflux disease without esophagitis: Secondary | ICD-10-CM | POA: Diagnosis not present

## 2021-08-12 DIAGNOSIS — E782 Mixed hyperlipidemia: Secondary | ICD-10-CM | POA: Diagnosis not present

## 2021-08-12 DIAGNOSIS — H269 Unspecified cataract: Secondary | ICD-10-CM | POA: Diagnosis not present

## 2021-08-12 DIAGNOSIS — E119 Type 2 diabetes mellitus without complications: Secondary | ICD-10-CM | POA: Diagnosis not present

## 2021-08-12 DIAGNOSIS — I10 Essential (primary) hypertension: Secondary | ICD-10-CM | POA: Diagnosis not present

## 2021-08-12 DIAGNOSIS — G4700 Insomnia, unspecified: Secondary | ICD-10-CM | POA: Diagnosis not present

## 2021-09-01 DIAGNOSIS — Z23 Encounter for immunization: Secondary | ICD-10-CM | POA: Diagnosis not present

## 2021-09-06 ENCOUNTER — Ambulatory Visit
Admission: RE | Admit: 2021-09-06 | Discharge: 2021-09-06 | Disposition: A | Discharge status: Home / Self Care | Payer: Medicare Other | Source: Ambulatory Visit | Attending: Obstetrics & Gynecology | Admitting: Obstetrics & Gynecology

## 2021-09-06 ENCOUNTER — Other Ambulatory Visit: Payer: Self-pay

## 2021-09-06 DIAGNOSIS — Z780 Asymptomatic menopausal state: Secondary | ICD-10-CM | POA: Diagnosis not present

## 2021-09-06 DIAGNOSIS — M8589 Other specified disorders of bone density and structure, multiple sites: Secondary | ICD-10-CM | POA: Diagnosis not present

## 2021-09-06 DIAGNOSIS — M8580 Other specified disorders of bone density and structure, unspecified site: Secondary | ICD-10-CM

## 2021-09-06 DIAGNOSIS — M8588 Other specified disorders of bone density and structure, other site: Secondary | ICD-10-CM

## 2021-09-20 DIAGNOSIS — Z0000 Encounter for general adult medical examination without abnormal findings: Secondary | ICD-10-CM | POA: Diagnosis not present

## 2021-09-20 DIAGNOSIS — L501 Idiopathic urticaria: Secondary | ICD-10-CM | POA: Diagnosis not present

## 2021-09-20 DIAGNOSIS — I10 Essential (primary) hypertension: Secondary | ICD-10-CM | POA: Diagnosis not present

## 2021-09-20 DIAGNOSIS — G4700 Insomnia, unspecified: Secondary | ICD-10-CM | POA: Diagnosis not present

## 2021-09-20 DIAGNOSIS — R2681 Unsteadiness on feet: Secondary | ICD-10-CM | POA: Diagnosis not present

## 2021-09-20 DIAGNOSIS — I890 Lymphedema, not elsewhere classified: Secondary | ICD-10-CM | POA: Diagnosis not present

## 2021-09-20 DIAGNOSIS — E7800 Pure hypercholesterolemia, unspecified: Secondary | ICD-10-CM | POA: Diagnosis not present

## 2021-09-20 DIAGNOSIS — E1169 Type 2 diabetes mellitus with other specified complication: Secondary | ICD-10-CM | POA: Diagnosis not present

## 2021-10-14 DIAGNOSIS — R42 Dizziness and giddiness: Secondary | ICD-10-CM | POA: Diagnosis not present

## 2021-11-14 DIAGNOSIS — E782 Mixed hyperlipidemia: Secondary | ICD-10-CM | POA: Diagnosis not present

## 2021-11-14 DIAGNOSIS — K219 Gastro-esophageal reflux disease without esophagitis: Secondary | ICD-10-CM | POA: Diagnosis not present

## 2021-11-14 DIAGNOSIS — G4700 Insomnia, unspecified: Secondary | ICD-10-CM | POA: Diagnosis not present

## 2021-11-14 DIAGNOSIS — H269 Unspecified cataract: Secondary | ICD-10-CM | POA: Diagnosis not present

## 2021-11-14 DIAGNOSIS — E1169 Type 2 diabetes mellitus with other specified complication: Secondary | ICD-10-CM | POA: Diagnosis not present

## 2021-11-14 DIAGNOSIS — E7800 Pure hypercholesterolemia, unspecified: Secondary | ICD-10-CM | POA: Diagnosis not present

## 2021-11-14 DIAGNOSIS — I10 Essential (primary) hypertension: Secondary | ICD-10-CM | POA: Diagnosis not present

## 2021-11-14 DIAGNOSIS — E119 Type 2 diabetes mellitus without complications: Secondary | ICD-10-CM | POA: Diagnosis not present

## 2021-11-15 DIAGNOSIS — J3089 Other allergic rhinitis: Secondary | ICD-10-CM | POA: Diagnosis not present

## 2021-11-15 DIAGNOSIS — J301 Allergic rhinitis due to pollen: Secondary | ICD-10-CM | POA: Diagnosis not present

## 2021-11-15 DIAGNOSIS — L501 Idiopathic urticaria: Secondary | ICD-10-CM | POA: Diagnosis not present

## 2021-11-15 DIAGNOSIS — T783XXD Angioneurotic edema, subsequent encounter: Secondary | ICD-10-CM | POA: Diagnosis not present

## 2021-12-19 DIAGNOSIS — I517 Cardiomegaly: Secondary | ICD-10-CM | POA: Diagnosis not present

## 2021-12-19 DIAGNOSIS — A310 Pulmonary mycobacterial infection: Secondary | ICD-10-CM | POA: Diagnosis not present

## 2021-12-20 DIAGNOSIS — J09X2 Influenza due to identified novel influenza A virus with other respiratory manifestations: Secondary | ICD-10-CM | POA: Diagnosis not present

## 2021-12-21 DIAGNOSIS — R2689 Other abnormalities of gait and mobility: Secondary | ICD-10-CM | POA: Diagnosis not present

## 2021-12-21 DIAGNOSIS — I890 Lymphedema, not elsewhere classified: Secondary | ICD-10-CM | POA: Diagnosis not present

## 2021-12-21 DIAGNOSIS — R051 Acute cough: Secondary | ICD-10-CM | POA: Diagnosis not present

## 2021-12-21 DIAGNOSIS — Z7689 Persons encountering health services in other specified circumstances: Secondary | ICD-10-CM | POA: Diagnosis not present

## 2021-12-21 DIAGNOSIS — R093 Abnormal sputum: Secondary | ICD-10-CM | POA: Diagnosis not present

## 2021-12-21 DIAGNOSIS — R062 Wheezing: Secondary | ICD-10-CM | POA: Diagnosis not present

## 2021-12-21 DIAGNOSIS — R0602 Shortness of breath: Secondary | ICD-10-CM | POA: Diagnosis not present

## 2021-12-21 DIAGNOSIS — U071 COVID-19: Secondary | ICD-10-CM | POA: Diagnosis not present

## 2021-12-21 DIAGNOSIS — M170 Bilateral primary osteoarthritis of knee: Secondary | ICD-10-CM | POA: Diagnosis not present

## 2022-01-03 ENCOUNTER — Other Ambulatory Visit (HOSPITAL_BASED_OUTPATIENT_CLINIC_OR_DEPARTMENT_OTHER): Payer: Self-pay | Admitting: Obstetrics & Gynecology

## 2022-01-03 DIAGNOSIS — F5101 Primary insomnia: Secondary | ICD-10-CM

## 2022-01-04 ENCOUNTER — Other Ambulatory Visit (HOSPITAL_BASED_OUTPATIENT_CLINIC_OR_DEPARTMENT_OTHER): Payer: Self-pay

## 2022-01-04 DIAGNOSIS — E7800 Pure hypercholesterolemia, unspecified: Secondary | ICD-10-CM | POA: Diagnosis not present

## 2022-01-04 DIAGNOSIS — M6281 Muscle weakness (generalized): Secondary | ICD-10-CM | POA: Diagnosis not present

## 2022-01-04 DIAGNOSIS — E782 Mixed hyperlipidemia: Secondary | ICD-10-CM | POA: Diagnosis not present

## 2022-01-04 DIAGNOSIS — H269 Unspecified cataract: Secondary | ICD-10-CM | POA: Diagnosis not present

## 2022-01-04 DIAGNOSIS — I10 Essential (primary) hypertension: Secondary | ICD-10-CM | POA: Diagnosis not present

## 2022-01-04 DIAGNOSIS — E1169 Type 2 diabetes mellitus with other specified complication: Secondary | ICD-10-CM | POA: Diagnosis not present

## 2022-01-04 DIAGNOSIS — G4700 Insomnia, unspecified: Secondary | ICD-10-CM | POA: Diagnosis not present

## 2022-01-04 DIAGNOSIS — E119 Type 2 diabetes mellitus without complications: Secondary | ICD-10-CM | POA: Diagnosis not present

## 2022-01-04 DIAGNOSIS — K219 Gastro-esophageal reflux disease without esophagitis: Secondary | ICD-10-CM | POA: Diagnosis not present

## 2022-01-04 NOTE — Telephone Encounter (Signed)
Looks like Rx was already pended. Not sure if you had this or not. Please advise.

## 2022-01-06 DIAGNOSIS — M6281 Muscle weakness (generalized): Secondary | ICD-10-CM | POA: Diagnosis not present

## 2022-01-10 DIAGNOSIS — M6281 Muscle weakness (generalized): Secondary | ICD-10-CM | POA: Diagnosis not present

## 2022-01-12 DIAGNOSIS — R2243 Localized swelling, mass and lump, lower limb, bilateral: Secondary | ICD-10-CM | POA: Diagnosis not present

## 2022-01-12 DIAGNOSIS — R2689 Other abnormalities of gait and mobility: Secondary | ICD-10-CM | POA: Diagnosis not present

## 2022-01-12 DIAGNOSIS — Z8616 Personal history of COVID-19: Secondary | ICD-10-CM | POA: Diagnosis not present

## 2022-01-12 DIAGNOSIS — I890 Lymphedema, not elsewhere classified: Secondary | ICD-10-CM | POA: Diagnosis not present

## 2022-01-17 DIAGNOSIS — M6281 Muscle weakness (generalized): Secondary | ICD-10-CM | POA: Diagnosis not present

## 2022-01-19 DIAGNOSIS — R2681 Unsteadiness on feet: Secondary | ICD-10-CM | POA: Diagnosis not present

## 2022-01-19 DIAGNOSIS — I890 Lymphedema, not elsewhere classified: Secondary | ICD-10-CM | POA: Diagnosis not present

## 2022-01-19 NOTE — Progress Notes (Signed)
Cardiology Office Note   Date:  01/20/2022   ID:  [name], DOB Feb 25, 1941, MRN [id number]  PCP:  Aretta Nip, MD    Chief Complaint  Patient presents with   Follow-up   HTN  Wt Readings from Last 3 Encounters:  01/20/22 237 lb (107.5 kg)  07/04/21 232 lb (105.2 kg)  01/19/21 232 lb (105.2 kg)       History of Present Illness: Alexandra Williams is a 81 y.o. female  Who has had difficult to control hypertension. SHe has had hyperlipidemia as well.    Her husband was a patient of mine, but he passed away in 2016-05-14 after developing ESRD.   She has had chronic edema in her LE as well, thought to be lymphedema.   SOme days are worse than others.  She uses an inflation device to help with compression.  SHe uses compression stockings and tries to elevate legs as well.    She has had anxiety at thought that she is responsible for her special needs daughter.  She was looking for a program that she could transition to.   In 2018, the patient had an abdominal CT.  Cardiomegaly was noted.  There were coronary artery calcifications as well.  In the past, it was noted: "Coronary calcification: No symptoms of angina.  I encouraged her to try to be more active.  We went over risk factor modification including healthy diet, more exercise, watching blood pressure and cholesterol.  LDL 64 in June 2019.  She has atrial enlargement and RV enlargement noted on echo in 2018.  No signs of left-sided congestive heart failure.  No further testing needed at this time.  She will let us know she has any worsening symptoms."   The 2018 echocardiogram showed: Left ventricle: The cavity size was normal. Systolic function was   normal. The estimated ejection fraction was in the range of 55%   to 60%. Wall motion was normal; there were no regional wall   motion abnormalities. Features are consistent with a pseudonormal   left ventricular filling pattern, with concomitant abnormal    relaxation and increased filling pressure (grade 2 diastolic   dysfunction). - Mitral valve: There was mild regurgitation. - Left atrium: The atrium was mildly dilated. - Right ventricle: The cavity size was mildly dilated. Wall   thickness was normal. - Tricuspid valve: There was trivial regurgitation.   Impressions:   - Normal LVF with EF 55-60%, mild MR, grade 2DD, mild LAE, mildly   dilated RV and trivial TR.   Dilated atria and mildly dilated RV were noted.   Abdominal CT was done for abdominal pain.  Her son is a Art therapist from [place] and he helped treat her as well. She feels better.   She has a dry cough.  She thinks it was allergies.  Lisinopril was changed years ago.    In 08/2020, she moved to [place] with her daughter.  She sold her house.  She has access to exercise classes.  THey have exercise equipment and a pool.  THese have been open intermittently.    Still has the lymphedema.  Some days are better than others. Elevating legs and using compression.    Home BP readings in the 140-150 range.  Lymphedema is her biggest issue.    Denies : Chest pain. Dizziness. Nitroglycerin use. Orthopnea. Palpitations. Paroxysmal nocturnal dyspnea. Shortness of breath. Syncope.    Past Medical History:  Diagnosis Date  Abnormal Pap smear 4/82   colpo/ecc negative   Arthritis    knee   Cervical polyp 1/99   Colon polyps    Diabetes mellitus without complication ([place])    pre-diabetes   Fibrocystic breast    Hives    undetermined-on 7 antihistamines   Hyperlipidemia    Hypertension    Postmenopausal bleeding 3/99   on HRT/endometrial biopsy-focal simple hyperplasia   Uterine fibroid    Vasculitis (HCC)    bilat legs   Vitamin D deficiency     Past Surgical History:  Procedure Laterality Date   BREAST BIOPSY Left    INCISION AND DRAINAGE     Bartholin cyst   TONSILLECTOMY AND ADENOIDECTOMY     as a child     Current Outpatient Medications  Medication  Sig Dispense Refill   amLODipine (NORVASC) 10 MG tablet Take 1 tablet (10 mg total) by mouth daily. 90 tablet 3   aspirin 81 MG tablet Take 81 mg by mouth daily.     atorvastatin (LIPITOR) 40 MG tablet Take 1 tablet (40 mg total) by mouth daily. 90 tablet 3   carvedilol (COREG) 25 MG tablet Take 1 tablet (25 mg total) by mouth 2 (two) times daily. 180 tablet 3   EPIPEN 2-PAK 0.3 MG/0.3ML SOAJ Inject 0.3 mg into the muscle as needed (HIVES).     hydroxychloroquine (PLAQUENIL) 200 MG tablet Take 200 mg by mouth daily.      loratadine (CLARITIN) 10 MG tablet Take 1 tablet by mouth every evening.     losartan (COZAAR) 100 MG tablet Take 100 mg by mouth daily.     metFORMIN (GLUCOPHAGE-XR) 500 MG 24 hr tablet Take 500 mg by mouth in the morning and at bedtime.  0   ONETOUCH DELICA LANCETS 78E MISC   1   ONETOUCH VERIO test strip   1   zolpidem (AMBIEN) 5 MG tablet TAKE ONE TABLET BY MOUTH AT BEDTIME 30 tablet 5   No current facility-administered medications for this visit.    Allergies:   Codeine, Peanuts [peanut oil], Shellfish allergy, Sulfites, Tessalon [benzonatate], Hylan g-f 20, and Elemental sulfur    Social History:  The patient  reports that she has never smoked. She has never used smokeless tobacco. She reports that she does not drink alcohol and does not use drugs.   Family History:  The patient's family history includes Cancer in her father, mother, and paternal aunt; Diabetes in her mother; Heart attack in her father; Heart disease in her father; Hyperlipidemia in her mother; Hypertension in her father and mother; Kidney disease in her father; Mental retardation in her daughter; Pancreatic cancer in her mother; Ulcers in her father.    ROS:  Please see the history of present illness.   Otherwise, review of systems are positive for .   All other systems are reviewed and negative.    PHYSICAL EXAM: VS:  BP 104/70    Pulse 61    Ht 4\' 10"  (1.473 m)    Wt 237 lb (107.5 kg)    LMP  12/18/2000    SpO2 96%    BMI 49.53 kg/m  , BMI Body mass index is 49.53 kg/m. GEN: Well nourished, well developed, in no acute distress HEENT: normal Neck: no JVD, carotid bruits, or masses Cardiac: RRR; no murmurs, rubs, or gallops,; severe bilateral leg edema- lymphedema - mild pitting as well Respiratory:  clear to auscultation bilaterally, normal work of breathing GI: soft, nontender, nondistended, +  BS MS: no deformity or atrophy Skin: warm and dry, chronic changes on the legs Neuro:  Strength and sensation are intact Psych: euthymic mood, full affect   EKG:   The ekg ordered today demonstrates NSR, left axis deviation, no ST changes   Recent Labs: No results found for requested labs within last 8760 hours.   Lipid Panel No results found for: CHOL, TRIG, HDL, CHOLHDL, VLDL, LDLCALC, LDLDIRECT   Other studies Reviewed: Additional studies/ records that were reviewed today with results demonstrating: labs reviewed.   ASSESSMENT AND PLAN:  Coronary artery calcification: No angina on medical therapy.  Continue aggressive secondary prevention.  Healthy diet and regular exercise recommended. Hypertension: Low-salt diet.  450-388 systolic at home.  828 mm Hg systolic by my check.  Continue current meds.  Hyperlipidemia: Whole food, plant-based diet.  Increase fiber intake.  LDL 75.  Continue lipid-lowering therapy. Diabetes: A1C 7.2.  ,Whole food  plant-based diet with high fiber intake.  Keep participating in exercise classes. Lymphedema: Persistent.  Looking for a massage therapy.  She would like to see a vein specialist as well.  Refer to [facility].    Current medicines are reviewed at length with the patient today.  The patient concerns regarding her medicines were addressed.  The following changes have been made:  No change  Labs/ tests ordered today include:  No orders of the defined types were placed in this encounter.   Recommend 150 minutes/week of aerobic exercise Low  fat, low carb, high fiber diet recommended  Disposition:   FU in 1 year   Signed, Larae Grooms, MD  01/20/2022 2:03 [facility] [address], [place], [place]  [address] Phone: [phone]; Fax: [phone]

## 2022-01-20 ENCOUNTER — Other Ambulatory Visit: Payer: Self-pay

## 2022-01-20 ENCOUNTER — Encounter: Payer: Self-pay | Admitting: Interventional Cardiology

## 2022-01-20 ENCOUNTER — Ambulatory Visit (INDEPENDENT_AMBULATORY_CARE_PROVIDER_SITE_OTHER): Payer: Medicare Other | Admitting: Interventional Cardiology

## 2022-01-20 VITALS — BP 104/70 | HR 61 | Ht <= 58 in | Wt 237.0 lb

## 2022-01-20 DIAGNOSIS — E782 Mixed hyperlipidemia: Secondary | ICD-10-CM | POA: Diagnosis not present

## 2022-01-20 DIAGNOSIS — I2584 Coronary atherosclerosis due to calcified coronary lesion: Secondary | ICD-10-CM

## 2022-01-20 DIAGNOSIS — I10 Essential (primary) hypertension: Secondary | ICD-10-CM

## 2022-01-20 DIAGNOSIS — E119 Type 2 diabetes mellitus without complications: Secondary | ICD-10-CM | POA: Diagnosis not present

## 2022-01-20 DIAGNOSIS — I890 Lymphedema, not elsewhere classified: Secondary | ICD-10-CM | POA: Diagnosis not present

## 2022-01-20 DIAGNOSIS — I2510 Atherosclerotic heart disease of native coronary artery without angina pectoris: Secondary | ICD-10-CM | POA: Diagnosis not present

## 2022-01-20 NOTE — Patient Instructions (Addendum)
Medication Instructions:  Your physician recommends that you continue on your current medications as directed. Please refer to the Current Medication list given to you today.  *If you need a refill on your cardiac medications before your next appointment, please call your pharmacy*   Lab Work: None ordered   If you have labs (blood work) drawn today and your tests are completely normal, you will receive your results only by: [place] (if you have MyChart) OR A paper copy in the mail If you have any lab test that is abnormal or we need to change your treatment, we will call you to review the results.   Testing/Procedures: None ordered    Follow-Up: At [facility], you and your health needs are our priority.  As part of our continuing mission to provide you with exceptional heart care, we have created designated Provider Care Teams.  These Care Teams include your primary Cardiologist (physician) and Advanced Practice Providers (APPs -  Physician Assistants and Nurse Practitioners) who all work together to provide you with the care you need, when you need it.  We recommend signing up for the patient portal called "MyChart".  Sign up information is provided on this After Visit Summary.  MyChart is used to connect with patients for Virtual Visits (Telemedicine).  Patients are able to view lab/test results, encounter notes, upcoming appointments, etc.  Non-urgent messages can be sent to your provider as well.   To learn more about what you can do with MyChart, go to [email or web address].    Your next appointment:   12 month(s)  The format for your next appointment:   In Person  Provider:   Larae Grooms, MD     Other Instructions Your physician has referred you to see Vascular Surgery. Someone will contact you with a appointment

## 2022-01-24 DIAGNOSIS — R2681 Unsteadiness on feet: Secondary | ICD-10-CM | POA: Diagnosis not present

## 2022-01-24 DIAGNOSIS — I890 Lymphedema, not elsewhere classified: Secondary | ICD-10-CM | POA: Diagnosis not present

## 2022-01-26 DIAGNOSIS — I890 Lymphedema, not elsewhere classified: Secondary | ICD-10-CM | POA: Diagnosis not present

## 2022-01-26 DIAGNOSIS — R2681 Unsteadiness on feet: Secondary | ICD-10-CM | POA: Diagnosis not present

## 2022-01-31 DIAGNOSIS — I890 Lymphedema, not elsewhere classified: Secondary | ICD-10-CM | POA: Diagnosis not present

## 2022-01-31 DIAGNOSIS — R2681 Unsteadiness on feet: Secondary | ICD-10-CM | POA: Diagnosis not present

## 2022-02-03 DIAGNOSIS — R2681 Unsteadiness on feet: Secondary | ICD-10-CM | POA: Diagnosis not present

## 2022-02-03 DIAGNOSIS — I890 Lymphedema, not elsewhere classified: Secondary | ICD-10-CM | POA: Diagnosis not present

## 2022-02-06 DIAGNOSIS — I890 Lymphedema, not elsewhere classified: Secondary | ICD-10-CM | POA: Diagnosis not present

## 2022-02-06 DIAGNOSIS — R2681 Unsteadiness on feet: Secondary | ICD-10-CM | POA: Diagnosis not present

## 2022-02-14 DIAGNOSIS — R2681 Unsteadiness on feet: Secondary | ICD-10-CM | POA: Diagnosis not present

## 2022-02-14 DIAGNOSIS — I890 Lymphedema, not elsewhere classified: Secondary | ICD-10-CM | POA: Diagnosis not present

## 2022-02-15 ENCOUNTER — Other Ambulatory Visit: Payer: Self-pay

## 2022-02-15 DIAGNOSIS — I890 Lymphedema, not elsewhere classified: Secondary | ICD-10-CM

## 2022-02-16 DIAGNOSIS — R2681 Unsteadiness on feet: Secondary | ICD-10-CM | POA: Diagnosis not present

## 2022-02-16 DIAGNOSIS — I890 Lymphedema, not elsewhere classified: Secondary | ICD-10-CM | POA: Diagnosis not present

## 2022-02-19 ENCOUNTER — Other Ambulatory Visit: Payer: Self-pay | Admitting: Interventional Cardiology

## 2022-02-22 DIAGNOSIS — I890 Lymphedema, not elsewhere classified: Secondary | ICD-10-CM | POA: Diagnosis not present

## 2022-02-22 DIAGNOSIS — R2681 Unsteadiness on feet: Secondary | ICD-10-CM | POA: Diagnosis not present

## 2022-02-24 DIAGNOSIS — R2681 Unsteadiness on feet: Secondary | ICD-10-CM | POA: Diagnosis not present

## 2022-02-24 DIAGNOSIS — I890 Lymphedema, not elsewhere classified: Secondary | ICD-10-CM | POA: Diagnosis not present

## 2022-03-02 DIAGNOSIS — K529 Noninfective gastroenteritis and colitis, unspecified: Secondary | ICD-10-CM | POA: Diagnosis not present

## 2022-03-03 ENCOUNTER — Encounter: Payer: Medicare Other | Admitting: Vascular Surgery

## 2022-03-03 ENCOUNTER — Encounter (HOSPITAL_COMMUNITY): Payer: Medicare Other

## 2022-03-10 DIAGNOSIS — A084 Viral intestinal infection, unspecified: Secondary | ICD-10-CM | POA: Diagnosis not present

## 2022-03-11 ENCOUNTER — Other Ambulatory Visit: Payer: Self-pay | Admitting: Interventional Cardiology

## 2022-03-21 ENCOUNTER — Other Ambulatory Visit: Payer: Self-pay | Admitting: Interventional Cardiology

## 2022-03-23 DIAGNOSIS — A084 Viral intestinal infection, unspecified: Secondary | ICD-10-CM | POA: Diagnosis not present

## 2022-03-30 NOTE — Progress Notes (Signed)
?Office Note  ? ? ? ?CC: Known history of lower extremity lymphedema ?Requesting Provider:  Aretta Nip, MD ? ?HPI: Alexandra Williams is a 81 y.o. (24-Feb-1941) female presenting at the request of .Rankins, Bill Salinas, MD for known bilateral lower extremity lymphedema. ? ?Patient was last seen in 2018 by Dr. Scot Dock.  Lymphedema has previously been managed with massage therapy, manual lymphatic drainage therapy, home lymphatic pump therapy. ? ?Patient presents today to reestablish care and discuss possible therapeutic options. ?Since last seen, Alexandra Williams has moved into a retirement community and has been minimally compliant with therapies.  The physical therapist that she has seen previously for management of the lymphatic drainage therapy now only performs this on postradiation patients.  Her legs have gotten so large, she has been unable to place her compression wraps on her legs, and she has been noncompliant with her compression pump. ? ?She continues to try to live an active lifestyle, and remains independent driving her self around. ?She notes no significant changes in the size of bilateral lower extremities, but has appreciated more firmness and less variation with elevation.  Denies wounds, denies ulcerations. ? ? ?Past Medical History:  ?Diagnosis Date  ? Abnormal Pap smear 4/82  ? colpo/ecc negative  ? Arthritis   ? knee  ? Cervical polyp 1/99  ? Colon polyps   ? Diabetes mellitus without complication ([place])   ? pre-diabetes  ? Fibrocystic breast   ? Hives   ? undetermined-on 7 antihistamines  ? Hyperlipidemia   ? Hypertension   ? Postmenopausal bleeding 3/99  ? on HRT/endometrial biopsy-focal simple hyperplasia  ? Uterine fibroid   ? Vasculitis ([place])   ? bilat legs  ? Vitamin D deficiency   ? ? ?Past Surgical History:  ?Procedure Laterality Date  ? BREAST BIOPSY Left   ? INCISION AND DRAINAGE    ? Bartholin cyst  ? TONSILLECTOMY AND ADENOIDECTOMY    ? as a child  ? ? ?Social History  ? ?Socioeconomic  History  ? Marital status: Widowed  ?  Spouse name: Not on file  ? Number of children: Not on file  ? Years of education: Not on file  ? Highest education level: Not on file  ?Occupational History  ? Not on file  ?Tobacco Use  ? Smoking status: Never  ? Smokeless tobacco: Never  ?Vaping Use  ? Vaping Use: Never used  ?Substance and Sexual Activity  ? Alcohol use: No  ?  Alcohol/week: 0.0 standard drinks  ? Drug use: No  ? Sexual activity: Not Currently  ?  Partners: Male  ?  Birth control/protection: Post-menopausal  ?Other Topics Concern  ? Not on file  ?Social History Narrative  ? Not on file  ? ?Social Determinants of Health  ? ?Financial Resource Strain: Not on file  ?Food Insecurity: Not on file  ?Transportation Needs: Not on file  ?Physical Activity: Not on file  ?Stress: Not on file  ?Social Connections: Not on file  ?Intimate Partner Violence: Not on file  ? ?Family History  ?Problem Relation Age of Onset  ? Pancreatic cancer Mother   ? Cancer Mother   ? Diabetes Mother   ? Hyperlipidemia Mother   ? Hypertension Mother   ? Heart attack Father   ? Ulcers Father   ? Heart disease Father   ? Kidney disease Father   ? Cancer Father   ? Hypertension Father   ? Mental retardation Daughter   ? Cancer Paternal Aunt   ?  mouth  ? ? ?Current Outpatient Medications  ?Medication Sig Dispense Refill  ? amLODipine (NORVASC) 10 MG tablet TAKE ONE TABLET EVERY DAY 90 tablet 3  ? aspirin 81 MG tablet Take 81 mg by mouth daily.    ? atorvastatin (LIPITOR) 40 MG tablet TAKE ONE TABLET BY MOUTH DAILY 90 tablet 3  ? carvedilol (COREG) 25 MG tablet TAKE ONE TABLET BY MOUTH TWICE DAILY. 180 tablet 3  ? EPIPEN 2-PAK 0.3 MG/0.3ML SOAJ Inject 0.3 mg into the muscle as needed (HIVES).    ? hydroxychloroquine (PLAQUENIL) 200 MG tablet Take 200 mg by mouth daily.     ? loratadine (CLARITIN) 10 MG tablet Take 1 tablet by mouth every evening.    ? losartan (COZAAR) 100 MG tablet Take 100 mg by mouth daily.    ? metFORMIN  (GLUCOPHAGE-XR) 500 MG 24 hr tablet Take 500 mg by mouth in the morning and at bedtime.  0  ? ONETOUCH DELICA LANCETS 78I MISC   1  ? ONETOUCH VERIO test strip   1  ? zolpidem (AMBIEN) 5 MG tablet TAKE ONE TABLET BY MOUTH AT BEDTIME 30 tablet 5  ? ?No current facility-administered medications for this visit.  ? ? ?Allergies  ?Allergen Reactions  ? Codeine Hives  ? Peanuts [Peanut Oil] Hives  ? Shellfish Allergy Hives  ? Sulfites Hives  ?  preservatives ?preservatives  ? Tessalon [Benzonatate] Hives and Itching  ? Hylan G-F 20   ? Elemental Sulfur Itching  ? ? ? ?REVIEW OF SYSTEMS:  ?'[X]'$  denotes positive finding, '[ ]'$  denotes negative finding ?Cardiac  Comments:  ?Chest pain or chest pressure:    ?Shortness of breath upon exertion:    ?Short of breath when lying flat:    ?Irregular heart rhythm:    ?    ?Vascular    ?Pain in calf, thigh, or hip brought on by ambulation:    ?Pain in feet at night that wakes you up from your sleep:     ?Blood clot in your veins:    ?Leg swelling:     ?    ?Pulmonary    ?Oxygen at home:    ?Productive cough:     ?Wheezing:     ?    ?Neurologic    ?Sudden weakness in arms or legs:     ?Sudden numbness in arms or legs:     ?Sudden onset of difficulty speaking or slurred speech:    ?Temporary loss of vision in one eye:     ?Problems with dizziness:     ?    ?Gastrointestinal    ?Blood in stool:     ?Vomited blood:     ?    ?Genitourinary    ?Burning when urinating:     ?Blood in urine:    ?    ?Psychiatric    ?Major depression:     ?    ?Hematologic    ?Bleeding problems:    ?Problems with blood clotting too easily:    ?    ?Skin    ?Rashes or ulcers:    ?    ?Constitutional    ?Fever or chills:    ? ? ?PHYSICAL EXAMINATION: ? ?There were no vitals filed for this visit. ? ?General:  WDWN in NAD; vital signs documented above ?Gait: Not observed ?HENT: WNL, normocephalic ?Pulmonary: normal non-labored breathing , without wheezing ?Cardiac: regular HR,  ?Abdomen: soft, NT, no masses ?Skin:  without rashes ?Vascular Exam/Pulses: ? Right  Left  ?Radial 2+ (normal) 2+ (normal)  ?Ulnar 2+ (normal) 2+ (normal)  ?Femoral    ?Popliteal    ?DP 2+ (normal) 2+ (normal)  ?PT 2+ (normal) 2+ (normal)  ? ?Extremities: without ischemic changes, without Gangrene , without cellulitis; without open wounds;  ? ? ?Musculoskeletal: no muscle wasting or atrophy  ?Neurologic: A&O X 3;  No focal weakness or paresthesias are detected ?Psychiatric:  The pt has Normal affect. ? ? ?Non-Invasive Vascular Imaging:   ?No venous reflux appreciated on bilateral lower extremity venous ultrasound ? ? ? ?ASSESSMENT/PLAN: BEBE MONCURE is a 81 y.o. female presenting with bilateral lower extremity stage IV lymphedema with significant swelling, skin thickening and scarring.  No open wounds currently. ? ?I had a long conversation with Alexandra Williams regarding her diagnosis, as well as treatment modalities.  She is aware there are no surgical treatments other than the [facility] procedure which comes with significant morbidity and mortality.  With stage IV lymphedema, the goal of therapy is to maintain current size and prevent ulcerations from occurring.  I think Alexandra Williams would be an excellent candidate for [facility] lymphedema clinic, as they provide multidisciplinary care.  We discussed [facility] lymphedema clinic versus a referral to physical therapy for manual decompression therapy.  Alexandra Williams elected for [facility] referral. ? ?I told Alexandra Williams I am happy to help her should any questions or concerns arise.  She can follow-up with me as needed.  Unfortunately, other than the modalities she has used previously, I have no new treatment for her stage IV lymphedema.  I will refer her to the [facility] lymphedema clinic. ? ? ?Broadus John, MD ?[facility] ?[phone] ?Greater than 30 minutes was spent in preclinical chart review, clinical evaluation, post clinical documentation. ?

## 2022-03-31 ENCOUNTER — Ambulatory Visit (INDEPENDENT_AMBULATORY_CARE_PROVIDER_SITE_OTHER): Payer: Medicare Other | Admitting: Vascular Surgery

## 2022-03-31 ENCOUNTER — Ambulatory Visit (HOSPITAL_COMMUNITY)
Admission: RE | Admit: 2022-03-31 | Discharge: 2022-03-31 | Disposition: A | Discharge status: Home / Self Care | Payer: Medicare Other | Source: Ambulatory Visit | Attending: Vascular Surgery | Admitting: Vascular Surgery

## 2022-03-31 ENCOUNTER — Encounter: Payer: Self-pay | Admitting: Vascular Surgery

## 2022-03-31 VITALS — BP 113/74 | HR 61 | Temp 98.4°F | Resp 20 | Ht <= 58 in | Wt 235.0 lb

## 2022-03-31 DIAGNOSIS — I890 Lymphedema, not elsewhere classified: Secondary | ICD-10-CM

## 2022-04-03 ENCOUNTER — Other Ambulatory Visit: Payer: Self-pay | Admitting: Interventional Cardiology

## 2022-04-03 DIAGNOSIS — I10 Essential (primary) hypertension: Secondary | ICD-10-CM

## 2022-04-11 ENCOUNTER — Other Ambulatory Visit: Payer: Self-pay | Admitting: Obstetrics & Gynecology

## 2022-04-11 DIAGNOSIS — Z1231 Encounter for screening mammogram for malignant neoplasm of breast: Secondary | ICD-10-CM

## 2022-04-12 DIAGNOSIS — Z7189 Other specified counseling: Secondary | ICD-10-CM | POA: Diagnosis not present

## 2022-04-12 DIAGNOSIS — G4700 Insomnia, unspecified: Secondary | ICD-10-CM | POA: Diagnosis not present

## 2022-04-12 DIAGNOSIS — I890 Lymphedema, not elsewhere classified: Secondary | ICD-10-CM | POA: Diagnosis not present

## 2022-04-12 DIAGNOSIS — I10 Essential (primary) hypertension: Secondary | ICD-10-CM | POA: Diagnosis not present

## 2022-04-12 DIAGNOSIS — E7800 Pure hypercholesterolemia, unspecified: Secondary | ICD-10-CM | POA: Diagnosis not present

## 2022-04-12 DIAGNOSIS — E1169 Type 2 diabetes mellitus with other specified complication: Secondary | ICD-10-CM | POA: Diagnosis not present

## 2022-04-12 DIAGNOSIS — E119 Type 2 diabetes mellitus without complications: Secondary | ICD-10-CM | POA: Diagnosis not present

## 2022-05-02 ENCOUNTER — Ambulatory Visit
Admission: RE | Admit: 2022-05-02 | Discharge: 2022-05-02 | Disposition: A | Discharge status: Home / Self Care | Payer: Medicare Other | Source: Ambulatory Visit | Attending: Obstetrics & Gynecology | Admitting: Obstetrics & Gynecology

## 2022-05-02 DIAGNOSIS — Z1231 Encounter for screening mammogram for malignant neoplasm of breast: Secondary | ICD-10-CM | POA: Diagnosis not present

## 2022-05-24 DIAGNOSIS — Z961 Presence of intraocular lens: Secondary | ICD-10-CM | POA: Diagnosis not present

## 2022-05-24 DIAGNOSIS — H5201 Hypermetropia, right eye: Secondary | ICD-10-CM | POA: Diagnosis not present

## 2022-05-24 DIAGNOSIS — E113291 Type 2 diabetes mellitus with mild nonproliferative diabetic retinopathy without macular edema, right eye: Secondary | ICD-10-CM | POA: Diagnosis not present

## 2022-05-24 DIAGNOSIS — Z79899 Other long term (current) drug therapy: Secondary | ICD-10-CM | POA: Diagnosis not present

## 2022-07-05 ENCOUNTER — Other Ambulatory Visit (HOSPITAL_BASED_OUTPATIENT_CLINIC_OR_DEPARTMENT_OTHER): Payer: Self-pay | Admitting: Obstetrics & Gynecology

## 2022-07-05 DIAGNOSIS — F5101 Primary insomnia: Secondary | ICD-10-CM

## 2022-08-07 DIAGNOSIS — I890 Lymphedema, not elsewhere classified: Secondary | ICD-10-CM | POA: Diagnosis not present

## 2022-08-07 DIAGNOSIS — M6281 Muscle weakness (generalized): Secondary | ICD-10-CM | POA: Diagnosis not present

## 2022-08-30 DIAGNOSIS — I890 Lymphedema, not elsewhere classified: Secondary | ICD-10-CM | POA: Diagnosis not present

## 2022-08-30 DIAGNOSIS — M6281 Muscle weakness (generalized): Secondary | ICD-10-CM | POA: Diagnosis not present

## 2022-09-07 DIAGNOSIS — R5383 Other fatigue: Secondary | ICD-10-CM | POA: Diagnosis not present

## 2022-09-07 DIAGNOSIS — I10 Essential (primary) hypertension: Secondary | ICD-10-CM | POA: Diagnosis not present

## 2022-09-07 DIAGNOSIS — K649 Unspecified hemorrhoids: Secondary | ICD-10-CM | POA: Diagnosis not present

## 2022-09-07 DIAGNOSIS — L0591 Pilonidal cyst without abscess: Secondary | ICD-10-CM | POA: Diagnosis not present

## 2022-09-21 DIAGNOSIS — K219 Gastro-esophageal reflux disease without esophagitis: Secondary | ICD-10-CM | POA: Diagnosis not present

## 2022-09-21 DIAGNOSIS — Z6841 Body Mass Index (BMI) 40.0 and over, adult: Secondary | ICD-10-CM | POA: Diagnosis not present

## 2022-09-21 DIAGNOSIS — E7800 Pure hypercholesterolemia, unspecified: Secondary | ICD-10-CM | POA: Diagnosis not present

## 2022-09-21 DIAGNOSIS — I890 Lymphedema, not elsewhere classified: Secondary | ICD-10-CM | POA: Diagnosis not present

## 2022-09-21 DIAGNOSIS — Z6833 Body mass index (BMI) 33.0-33.9, adult: Secondary | ICD-10-CM | POA: Diagnosis not present

## 2022-09-21 DIAGNOSIS — I10 Essential (primary) hypertension: Secondary | ICD-10-CM | POA: Diagnosis not present

## 2022-09-21 DIAGNOSIS — Z23 Encounter for immunization: Secondary | ICD-10-CM | POA: Diagnosis not present

## 2022-09-21 DIAGNOSIS — Z1331 Encounter for screening for depression: Secondary | ICD-10-CM | POA: Diagnosis not present

## 2022-09-21 DIAGNOSIS — Z0000 Encounter for general adult medical examination without abnormal findings: Secondary | ICD-10-CM | POA: Diagnosis not present

## 2022-09-21 DIAGNOSIS — G4700 Insomnia, unspecified: Secondary | ICD-10-CM | POA: Diagnosis not present

## 2022-09-21 DIAGNOSIS — I2584 Coronary atherosclerosis due to calcified coronary lesion: Secondary | ICD-10-CM | POA: Diagnosis not present

## 2022-09-21 DIAGNOSIS — E1169 Type 2 diabetes mellitus with other specified complication: Secondary | ICD-10-CM | POA: Diagnosis not present

## 2022-09-21 DIAGNOSIS — E782 Mixed hyperlipidemia: Secondary | ICD-10-CM | POA: Diagnosis not present

## 2022-09-25 DIAGNOSIS — M6281 Muscle weakness (generalized): Secondary | ICD-10-CM | POA: Diagnosis not present

## 2022-09-25 DIAGNOSIS — I890 Lymphedema, not elsewhere classified: Secondary | ICD-10-CM | POA: Diagnosis not present

## 2022-09-27 DIAGNOSIS — L0231 Cutaneous abscess of buttock: Secondary | ICD-10-CM | POA: Diagnosis not present

## 2022-10-09 DIAGNOSIS — Z23 Encounter for immunization: Secondary | ICD-10-CM | POA: Diagnosis not present

## 2022-10-11 DIAGNOSIS — L501 Idiopathic urticaria: Secondary | ICD-10-CM | POA: Diagnosis not present

## 2022-10-11 DIAGNOSIS — J3089 Other allergic rhinitis: Secondary | ICD-10-CM | POA: Diagnosis not present

## 2022-10-11 DIAGNOSIS — J301 Allergic rhinitis due to pollen: Secondary | ICD-10-CM | POA: Diagnosis not present

## 2022-10-11 DIAGNOSIS — T783XXA Angioneurotic edema, initial encounter: Secondary | ICD-10-CM | POA: Diagnosis not present

## 2022-10-20 DIAGNOSIS — M6281 Muscle weakness (generalized): Secondary | ICD-10-CM | POA: Diagnosis not present

## 2022-10-20 DIAGNOSIS — I890 Lymphedema, not elsewhere classified: Secondary | ICD-10-CM | POA: Diagnosis not present

## 2022-11-13 DIAGNOSIS — Z6832 Body mass index (BMI) 32.0-32.9, adult: Secondary | ICD-10-CM | POA: Diagnosis not present

## 2022-11-13 DIAGNOSIS — R195 Other fecal abnormalities: Secondary | ICD-10-CM | POA: Diagnosis not present

## 2022-11-13 DIAGNOSIS — Z79899 Other long term (current) drug therapy: Secondary | ICD-10-CM | POA: Diagnosis not present

## 2022-11-13 DIAGNOSIS — E1169 Type 2 diabetes mellitus with other specified complication: Secondary | ICD-10-CM | POA: Diagnosis not present

## 2022-11-27 DIAGNOSIS — M6281 Muscle weakness (generalized): Secondary | ICD-10-CM | POA: Diagnosis not present

## 2022-11-27 DIAGNOSIS — I890 Lymphedema, not elsewhere classified: Secondary | ICD-10-CM | POA: Diagnosis not present

## 2022-12-07 ENCOUNTER — Encounter: Payer: Self-pay | Admitting: Internal Medicine

## 2022-12-07 ENCOUNTER — Ambulatory Visit: Payer: Medicare Other | Admitting: Internal Medicine

## 2022-12-07 VITALS — BP 124/78 | HR 61 | Temp 97.8°F | Resp 18 | Ht <= 58 in | Wt 220.4 lb

## 2022-12-07 DIAGNOSIS — J40 Bronchitis, not specified as acute or chronic: Secondary | ICD-10-CM | POA: Diagnosis not present

## 2022-12-07 DIAGNOSIS — J209 Acute bronchitis, unspecified: Secondary | ICD-10-CM | POA: Insufficient documentation

## 2022-12-07 DIAGNOSIS — R051 Acute cough: Secondary | ICD-10-CM

## 2022-12-07 LAB — POCT INFLUENZA A/B
Influenza A, POC: NEGATIVE
Influenza B, POC: NEGATIVE

## 2022-12-07 LAB — POC COVID19 BINAXNOW: SARS Coronavirus 2 Ag: NEGATIVE

## 2022-12-07 MED ORDER — FLUTICASONE PROPIONATE 50 MCG/ACT NA SUSP: 1.0000 | Freq: Two times a day (BID) | NASAL | 0 refills | Status: DC

## 2022-12-07 MED ORDER — AZITHROMYCIN 250 MG PO TABS
ORAL_TABLET | ORAL | 0 refills | Status: DC
Start: 2022-12-07 — End: 2023-07-09

## 2022-12-07 NOTE — Progress Notes (Signed)
Office Visit  Subjective   Patient ID: Alexandra Williams   DOB: 03/19/1941   Age: 81 y.o.   MRN: [id number]   Chief Complaint Chief Complaint  Patient presents with   Cough    Runny nose, sore throat for about three days     History of Present Illness The patient is a 81year old [place] female who presents with upper respiratory tract symptoms which began 4 days ago.  She has had clear nasal discharge with post nasal drip with sore throat.  This has now moved down into her chest and she is having cough productive of green sputum.  She denies any f/c, SOB, wheezing, body aches, headache, nausea, vomiting, diarrhea, wheezing or other problems. Alleviating factors: delsym.  She did have a flu vaccine this past year and she has had 5 COVID-19 vaccines including 3 boosters. The patient's past medical history is notable for a history of allergies.      Past Medical History Past Medical History:  Diagnosis Date   Abnormal Pap smear 4/82   colpo/ecc negative   Arthritis    knee   Cervical polyp 1/99   Colon polyps    Diabetes mellitus without complication ([place])    pre-diabetes   Fibrocystic breast    Hives    undetermined-on 7 antihistamines   Hyperlipidemia    Hypertension    Postmenopausal bleeding 3/99   on HRT/endometrial biopsy-focal simple hyperplasia   Uterine fibroid    Vasculitis (HCC)    bilat legs   Vitamin D deficiency      Allergies Allergies  Allergen Reactions   Codeine Hives   Peanuts [Peanut Oil] Hives   Shellfish Allergy Hives   Sulfites Hives    preservatives preservatives   Tessalon [Benzonatate] Hives and Itching   Hylan G-F 20    Elemental Sulfur Itching     Review of Systems Review of Systems  Constitutional:  Negative for chills and fever.  Eyes:  Negative for blurred vision and double vision.  Respiratory:  Positive for cough and sputum production. Negative for shortness of breath and wheezing.   Cardiovascular:  Positive for leg  swelling. Negative for chest pain and palpitations.  Gastrointestinal:  Negative for abdominal pain, constipation, diarrhea, nausea and vomiting.  Musculoskeletal:  Negative for myalgias.  Neurological:  Negative for dizziness, weakness and headaches.       Objective:    Vitals BP 124/78 (BP Location: Left Arm, Patient Position: Sitting, Cuff Size: Normal)   Pulse 61   Temp 97.8 F (36.6 C) (Temporal)   Resp 18   Ht '4\' 10"'$  (1.473 m)   Wt 220 lb 6.4 oz (100 kg)   LMP 12/18/2000   SpO2 93%   BMI 46.06 kg/m    Physical Examination Physical Exam Constitutional:      Appearance: Normal appearance. She is not ill-appearing.  HENT:     Right Ear: Tympanic membrane, ear canal and external ear normal.     Left Ear: Tympanic membrane, ear canal and external ear normal.     Mouth/Throat:     Mouth: Mucous membranes are moist.     Pharynx: Oropharynx is clear. No oropharyngeal exudate or posterior oropharyngeal erythema.  Cardiovascular:     Rate and Rhythm: Normal rate and regular rhythm.     Pulses: Normal pulses.     Heart sounds: No murmur heard.    No friction rub. No gallop.  Pulmonary:     Effort: Pulmonary effort is normal.  No respiratory distress.     Breath sounds: No wheezing, rhonchi or rales.  Abdominal:     General: Abdomen is flat. There is no distension.     Tenderness: There is no abdominal tenderness.  Musculoskeletal:     Right lower leg: Edema present.     Left lower leg: Edema present.     Comments: She has bilateral chronic lympedema  Skin:    General: Skin is warm and dry.     Findings: No rash.  Neurological:     Mental Status: She is alert.        Assessment & Plan:   Bronchitis She is allergic to doxycyline and augmentin.  We will  try her on a zpak and flonase.  Continue supportive care.  Her rapid COVID and flu testing was negative.    No follow-ups on file.   Townsend Roger, MD

## 2022-12-07 NOTE — Assessment & Plan Note (Addendum)
She is allergic to doxycyline and augmentin.  We will  try her on a zpak and flonase.  Continue supportive care.  Her rapid COVID and flu testing was negative.

## 2022-12-13 ENCOUNTER — Other Ambulatory Visit: Payer: Self-pay | Admitting: *Deleted

## 2022-12-13 NOTE — Patient Outreach (Signed)
Notification received from [facility] for red flag for discharge instructions and follow up appointment.  Telephone call made to Mrs. Tyrell [phone]. Mrs. Dunsmore states she went to PCP office for cough. States cough is still lingering but denies having any needs at this time. States PCP is Dr. Leonides Schanz. States Dr. Radene Ou has since retired.    Marthenia Rolling, MSN, RN,BSN [place] Acute Care Coordinator [phone] (Direct dial)

## 2022-12-21 DIAGNOSIS — L0231 Cutaneous abscess of buttock: Secondary | ICD-10-CM | POA: Diagnosis not present

## 2023-01-04 ENCOUNTER — Other Ambulatory Visit (HOSPITAL_BASED_OUTPATIENT_CLINIC_OR_DEPARTMENT_OTHER): Payer: Self-pay | Admitting: Obstetrics & Gynecology

## 2023-01-04 DIAGNOSIS — F5101 Primary insomnia: Secondary | ICD-10-CM

## 2023-01-09 ENCOUNTER — Encounter: Payer: Self-pay | Admitting: Interventional Cardiology

## 2023-01-10 DIAGNOSIS — M6281 Muscle weakness (generalized): Secondary | ICD-10-CM | POA: Diagnosis not present

## 2023-01-10 DIAGNOSIS — I890 Lymphedema, not elsewhere classified: Secondary | ICD-10-CM | POA: Diagnosis not present

## 2023-01-31 DIAGNOSIS — M6281 Muscle weakness (generalized): Secondary | ICD-10-CM | POA: Diagnosis not present

## 2023-01-31 DIAGNOSIS — I890 Lymphedema, not elsewhere classified: Secondary | ICD-10-CM | POA: Diagnosis not present

## 2023-02-08 ENCOUNTER — Ambulatory Visit: Payer: Medicare Other | Attending: Physician Assistant | Admitting: Physician Assistant

## 2023-02-08 ENCOUNTER — Encounter: Payer: Self-pay | Admitting: Physician Assistant

## 2023-02-08 VITALS — BP 152/60 | HR 60 | Ht <= 58 in | Wt 225.2 lb

## 2023-02-08 DIAGNOSIS — I2584 Coronary atherosclerosis due to calcified coronary lesion: Secondary | ICD-10-CM | POA: Insufficient documentation

## 2023-02-08 DIAGNOSIS — I10 Essential (primary) hypertension: Secondary | ICD-10-CM | POA: Diagnosis not present

## 2023-02-08 DIAGNOSIS — I890 Lymphedema, not elsewhere classified: Secondary | ICD-10-CM | POA: Insufficient documentation

## 2023-02-08 DIAGNOSIS — E782 Mixed hyperlipidemia: Secondary | ICD-10-CM | POA: Insufficient documentation

## 2023-02-08 DIAGNOSIS — R011 Cardiac murmur, unspecified: Secondary | ICD-10-CM | POA: Diagnosis not present

## 2023-02-08 DIAGNOSIS — I2510 Atherosclerotic heart disease of native coronary artery without angina pectoris: Secondary | ICD-10-CM | POA: Diagnosis not present

## 2023-02-08 MED ORDER — CARVEDILOL 25 MG PO TABS: 25.0000 mg | ORAL_TABLET | Freq: Two times a day (BID) | ORAL | 3 refills | Status: DC

## 2023-02-08 MED ORDER — AMLODIPINE BESYLATE 10 MG PO TABS: 10.0000 mg | ORAL_TABLET | Freq: Every day | ORAL | 3 refills | Status: DC

## 2023-02-08 NOTE — Patient Instructions (Signed)
Medication Instructions:  Your physician recommends that you continue on your current medications as directed. Please refer to the Current Medication list given to you today.  *If you need a refill on your cardiac medications before your next appointment, please call your pharmacy*   Lab Work: None ordered If you have labs (blood work) drawn today and your tests are completely normal, you will receive your results only by: [place] (if you have MyChart) OR A paper copy in the mail If you have any lab test that is abnormal or we need to change your treatment, we will call you to review the results.   Follow-Up: At [facility], you and your health needs are our priority.  As part of our continuing mission to provide you with exceptional heart care, we have created designated Provider Care Teams.  These Care Teams include your primary Cardiologist (physician) and Advanced Practice Providers (APPs -  Physician Assistants and Nurse Practitioners) who all work together to provide you with the care you need, when you need it.  Your next appointment:   1 year(s)  Provider:   Larae Grooms, MD    Other Instructions Check your blood pressure daily at home, one hour after taking your morning medications.  Low-Sodium Eating Plan Sodium, which is an element that makes up salt, helps you maintain a healthy balance of fluids in your body. Too much sodium can increase your blood pressure and cause fluid and waste to be held in your body. Your health care provider or dietitian may recommend following this plan if you have high blood pressure (hypertension), kidney disease, liver disease, or heart failure. Eating less sodium can help lower your blood pressure, reduce swelling, and protect your heart, liver, and kidneys. What are tips for following this plan? Reading food labels The Nutrition Facts label lists the amount of sodium in one serving of the food. If you eat more than one  serving, you must multiply the listed amount of sodium by the number of servings. Choose foods with less than 140 mg of sodium per serving. Avoid foods with 300 mg of sodium or more per serving. Shopping  Look for lower-sodium products, often labeled as "low-sodium" or "no salt added." Always check the sodium content, even if foods are labeled as "unsalted" or "no salt added." Buy fresh foods. Avoid canned foods and pre-made or frozen meals. Avoid canned, cured, or processed meats. Buy breads that have less than 80 mg of sodium per slice. Cooking  Eat more home-cooked food and less restaurant, buffet, and fast food. Avoid adding salt when cooking. Use salt-free seasonings or herbs instead of table salt or sea salt. Check with your health care provider or pharmacist before using salt substitutes. Cook with plant-based oils, such as canola, sunflower, or olive oil. Meal planning When eating at a restaurant, ask that your food be prepared with less salt or no salt, if possible. Avoid dishes labeled as brined, pickled, cured, smoked, or made with soy sauce, miso, or teriyaki sauce. Avoid foods that contain MSG (monosodium glutamate). MSG is sometimes added to [place] food, bouillon, and some canned foods. Make meals that can be grilled, baked, poached, roasted, or steamed. These are generally made with less sodium. General information Most people on this plan should limit their sodium intake to 1,500-2,000 mg (milligrams) of sodium each day. What foods should I eat? Fruits Fresh, frozen, or canned fruit. Fruit juice. Vegetables Fresh or frozen vegetables. "No salt added" canned vegetables. "No  salt added" tomato sauce and paste. Low-sodium or reduced-sodium tomato and vegetable juice. Grains Low-sodium cereals, including oats, puffed wheat and rice, and shredded wheat. Low-sodium crackers. Unsalted rice. Unsalted pasta. Low-sodium bread. Whole-grain breads and whole-grain pasta. Meats and  other proteins Fresh or frozen (no salt added) meat, poultry, seafood, and fish. Low-sodium canned tuna and salmon. Unsalted nuts. Dried peas, beans, and lentils without added salt. Unsalted canned beans. Eggs. Unsalted nut butters. Dairy Milk. Soy milk. Cheese that is naturally low in sodium, such as ricotta cheese, fresh mozzarella, or Swiss cheese. Low-sodium or reduced-sodium cheese. Cream cheese. Yogurt. Seasonings and condiments Fresh and dried herbs and spices. Salt-free seasonings. Low-sodium mustard and ketchup. Sodium-free salad dressing. Sodium-free light mayonnaise. Fresh or refrigerated horseradish. Lemon juice. Vinegar. Other foods Homemade, reduced-sodium, or low-sodium soups. Unsalted popcorn and pretzels. Low-salt or salt-free chips. The items listed above may not be a complete list of foods and beverages you can eat. Contact a dietitian for more information. What foods should I avoid? Vegetables Sauerkraut, pickled vegetables, and relishes. Olives. [place] fries. Onion rings. Regular canned vegetables (not low-sodium or reduced-sodium). Regular canned tomato sauce and paste (not low-sodium or reduced-sodium). Regular tomato and vegetable juice (not low-sodium or reduced-sodium). Frozen vegetables in sauces. Grains Instant hot cereals. Bread stuffing, pancake, and biscuit mixes. Croutons. Seasoned rice or pasta mixes. Noodle soup cups. Boxed or frozen macaroni and cheese. Regular salted crackers. Self-rising flour. Meats and other proteins Meat or fish that is salted, canned, smoked, spiced, or pickled. Precooked or cured meat, such as sausages or meat loaves. Berniece Salines. Ham. Pepperoni. Hot dogs. Corned beef. Chipped beef. Salt pork. Jerky. Pickled herring. Anchovies and sardines. Regular canned tuna. Salted nuts. Dairy Processed cheese and cheese spreads. Hard cheeses. Cheese curds. Blue cheese. Feta cheese. String cheese. Regular cottage cheese. Buttermilk. Canned milk. Fats and  oils Salted butter. Regular margarine. Ghee. Bacon fat. Seasonings and condiments Onion salt, garlic salt, seasoned salt, table salt, and sea salt. Canned and packaged gravies. Worcestershire sauce. Tartar sauce. Barbecue sauce. Teriyaki sauce. Soy sauce, including reduced-sodium. Steak sauce. Fish sauce. Oyster sauce. Cocktail sauce. Horseradish that you find on the shelf. Regular ketchup and mustard. Meat flavorings and tenderizers. Bouillon cubes. Hot sauce. Pre-made or packaged marinades. Pre-made or packaged taco seasonings. Relishes. Regular salad dressings. Salsa. Other foods Salted popcorn and pretzels. Corn chips and puffs. Potato and tortilla chips. Canned or dried soups. Pizza. Frozen entrees and pot pies. The items listed above may not be a complete list of foods and beverages you should avoid. Contact a dietitian for more information. Summary Eating less sodium can help lower your blood pressure, reduce swelling, and protect your heart, liver, and kidneys. Most people on this plan should limit their sodium intake to 1,500-2,000 mg (milligrams) of sodium each day. Canned, boxed, and frozen foods are high in sodium. Restaurant foods, fast foods, and pizza are also very high in sodium. You also get sodium by adding salt to food. Try to cook at home, eat more fresh fruits and vegetables, and eat less fast food and canned, processed, or prepared foods. This information is not intended to replace advice given to you by your health care provider. Make sure you discuss any questions you have with your health care provider. Document Revised: 01/09/2020 Document Reviewed: 11/05/2019 Elsevier Patient Education  [place].

## 2023-02-08 NOTE — Progress Notes (Signed)
Office Visit    Patient Name: Alexandra Williams Date of Encounter: 02/08/2023  PCP:  Cari Caraway, MD   [place]  Cardiologist:  Larae Grooms, MD  Advanced Practice Provider:  No care team member to display Electrophysiologist:  None   HPI    Alexandra Williams is a 82 y.o. female with past medical history significant for difficult to control hypertension hyperlipidemia presents today for follow-up visit.   Patient has a history of chronic edema in her lower extremity thought to be lymphedema.  Some days are worse than others.  She uses an inflation device to help with compression.  She also has compression stockings and tries to elevate her legs as well.  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  2018, she had an abdominal CT.  Cardiomegaly was noted.  There were coronary artery calcifications as well.  In the past it was noted " coronary calcification, no symptoms of angina."  Encouraged to be more active.  Reviewed risk modifications which included healthy diet, more exercise, watching blood pressure and cholesterol.  LDL 60 21 May 2018.  She has had atrial enlargement and RV enlargement noted on echocardiogram 2018.  No signs of left-sided congestive heart failure.  No further testing needed at that time.  She was told to let us know if she has worsening symptoms.  2018 echocardiogram showed normal LVEF 55 to 60%, mild MR, grade 2 DD, mild LAE, mildly dilated RV, and trivial TR.  Dilated atria and mildly dilated RV were noted.  Nominal CT scan was done for abdominal pain.  Her son is a Restaurant manager, fast food from [place] and was helped to treat her as well.  She was feeling better at that time.  She did endorse a dry cough and thought it was allergies.  Lisinopril was changed years ago.  08/2020, she moved to [place] with her daughter.  She sold her house.  She does have access to exercise classes.  Still endorse lymphedema.  Some days are  better than others.  She continue to elevate legs and use compression.  Blood pressure readings 0000000 to [id number] systolic.  She was last seen 01/20/2022 and lipedema was her biggest issue at that time.  A referral to [facility] was given.  Today, she states that she sees [facility] re[facility] for lymphedema.  She did see Dr. Unk Lightning and he could not help her.  She saw [facility] initially and went through several rounds of therapy.  Then her husband passed away and she stopped doing the lymphatic massage but still bandaged her legs.  She then got the lymphedema pump.,  And was no longer excepting lymphedema patients without cancer not so she ended up at [facility].  She does share that her trips to [facility] will end soon and she will be on her own.  She can always schedule an as needed session with them.  At home, she has braces, compression socks, and a pump.  Reports no shortness of breath nor dyspnea on exertion. Reports no chest pain, pressure, or tightness. No orthopnea, PND. Reports no palpitations.    Past Medical History    Past Medical History:  Diagnosis Date   Abnormal Pap smear 4/82   colpo/ecc negative   Arthritis    knee   Cervical polyp 1/99   Colon polyps    Diabetes mellitus without complication (HCC)    pre-diabetes   Fibrocystic breast  Hives    undetermined-on 7 antihistamines   Hyperlipidemia    Hypertension    Postmenopausal bleeding 3/99   on HRT/endometrial biopsy-focal simple hyperplasia   Uterine fibroid    Vasculitis (HCC)    bilat legs   Vitamin D deficiency    Past Surgical History:  Procedure Laterality Date   BREAST BIOPSY Left    INCISION AND DRAINAGE     Bartholin cyst   TONSILLECTOMY AND ADENOIDECTOMY     as a child    Allergies  Allergies  Allergen Reactions   Codeine Hives   Peanuts [Peanut Oil] Hives   Shellfish Allergy Hives   Sulfites Hives    preservatives preservatives   Tessalon [Benzonatate] Hives and Itching   Hylan  G-F 20    Elemental Sulfur Itching     EKGs/Labs/Other Studies Reviewed:   The following studies were reviewed today:  Echo 08/13/17 Study Conclusions   - Left ventricle: The cavity size was normal. Systolic function was    normal. The estimated ejection fraction was in the range of 55%    to 60%. Wall motion was normal; there were no regional wall    motion abnormalities. Features are consistent with a pseudonormal    left ventricular filling pattern, with concomitant abnormal    relaxation and increased filling pressure (grade 2 diastolic    dysfunction).  - Mitral valve: There was mild regurgitation.  - Left atrium: The atrium was mildly dilated.  - Right ventricle: The cavity size was mildly dilated. Wall    thickness was normal.  - Tricuspid valve: There was trivial regurgitation.   Impressions:   - Normal LVF with EF 55-60%, mild MR, grade 2DD, mild LAE, mildly    dilated RV and trivial TR.   EKG:  EKG is  ordered today.  The ekg ordered today demonstrates normal sinus rhythm, rate 61 bpm  Recent Labs: No results found for requested labs within last 365 days.  Recent Lipid Panel No results found for: "CHOL", "TRIG", "HDL", "CHOLHDL", "VLDL", "LDLCALC", "LDLDIRECT"  Home Medications   Current Meds  Medication Sig   aspirin 81 MG tablet Take 81 mg by mouth daily.   atorvastatin (LIPITOR) 40 MG tablet TAKE ONE TABLET BY MOUTH DAILY   EPIPEN 2-PAK 0.3 MG/0.3ML SOAJ Inject 0.3 mg into the muscle as needed (HIVES).   loratadine (CLARITIN) 10 MG tablet Take 1 tablet by mouth every evening.   metFORMIN (GLUCOPHAGE-XR) 500 MG 24 hr tablet Take 500 mg by mouth in the morning and at bedtime.   olmesartan (BENICAR) 40 MG tablet Take 40 mg by mouth daily.   ONETOUCH DELICA LANCETS 99991111 MISC    ONETOUCH VERIO test strip    zolpidem (AMBIEN) 5 MG tablet TAKE ONE TABLET BY MOUTH AT BEDTIME   [DISCONTINUED] amLODipine (NORVASC) 10 MG tablet TAKE ONE TABLET EVERY DAY    [DISCONTINUED] carvedilol (COREG) 25 MG tablet TAKE ONE TABLET BY MOUTH TWICE DAILY.     Review of Systems      All other systems reviewed and are otherwise negative except as noted above.  Physical Exam    VS:  BP (!) 152/60   Pulse 60   Ht 4' 10"$  (1.473 m)   Wt 225 lb 3.2 oz (102.2 kg)   LMP 12/18/2000   SpO2 97%   BMI 47.07 kg/m  , BMI Body mass index is 47.07 kg/m.  Wt Readings from Last 3 Encounters:  02/08/23 225 lb 3.2 oz (102.2 kg)  12/07/22 220 lb 6.4 oz (100 kg)  03/31/22 235 lb (106.6 kg)     GEN: Well nourished, well developed, in no acute distress. HEENT: normal. Neck: Supple, no JVD, carotid bruits, or masses. Cardiac: RRR, no murmurs, rubs, or gallops. No clubbing, cyanosis, edema.  Radials/PT 2+ and equal bilaterally.  Respiratory:  Respirations regular and unlabored, clear to auscultation bilaterally. GI: Soft, nontender, nondistended. MS: No deformity or atrophy. Skin: Warm and dry, no rash. Neuro:  Strength and sensation are intact. Psych: Normal affect.  Assessment & Plan    Hypertension -she takes her BP 0000000 mmHg systolic  -continue current medication: Amlodipine 10 mg daily, aspirin 81 mg daily, Lipitor 40 mg daily, carvedilol 25 mg twice daily, Benicar 40 mg daily  Hyperlipidemia  -LDL 81 -triglycerides 133 -continue Lipitor -repeat lipid panel fall 2024  Lymphedema  -[facility] therapy  -continue braces, pump, compression -discussed getting back on the stationary bike -low sodium diet  Mild Mitral regurgitation  -last echo 2018, stable     Disposition: Follow up 1 year with Larae Grooms, MD or APP.  Signed, Elgie Collard, PA-C 02/08/2023, 10:17 PM [facility]

## 2023-03-06 ENCOUNTER — Ambulatory Visit
Admission: RE | Admit: 2023-03-06 | Discharge: 2023-03-06 | Disposition: A | Discharge status: Home / Self Care | Payer: Medicare Other | Source: Ambulatory Visit | Attending: Pain Medicine | Admitting: Pain Medicine

## 2023-03-06 ENCOUNTER — Other Ambulatory Visit: Payer: Self-pay | Admitting: Pain Medicine

## 2023-03-06 DIAGNOSIS — M549 Dorsalgia, unspecified: Secondary | ICD-10-CM

## 2023-03-06 DIAGNOSIS — M545 Low back pain, unspecified: Secondary | ICD-10-CM | POA: Diagnosis not present

## 2023-03-06 DIAGNOSIS — I10 Essential (primary) hypertension: Secondary | ICD-10-CM | POA: Diagnosis not present

## 2023-03-06 DIAGNOSIS — Z6830 Body mass index (BMI) 30.0-30.9, adult: Secondary | ICD-10-CM | POA: Diagnosis not present

## 2023-03-11 IMAGING — MG MM DIGITAL SCREENING BILAT W/ TOMO AND CAD
8 series · 8 of 24 positions shown · non-contrast
Comparison: Previous exam(s).

CLINICAL DATA: Screening.

EXAM:
DIGITAL SCREENING BILATERAL MAMMOGRAM WITH TOMOSYNTHESIS AND CAD
TECHNIQUE: Bilateral screening digital craniocaudal and mediolateral oblique
mammograms were obtained. Bilateral screening digital breast
tomosynthesis was performed. The images were evaluated with
computer-aided detection.

[R CC synth-2D]
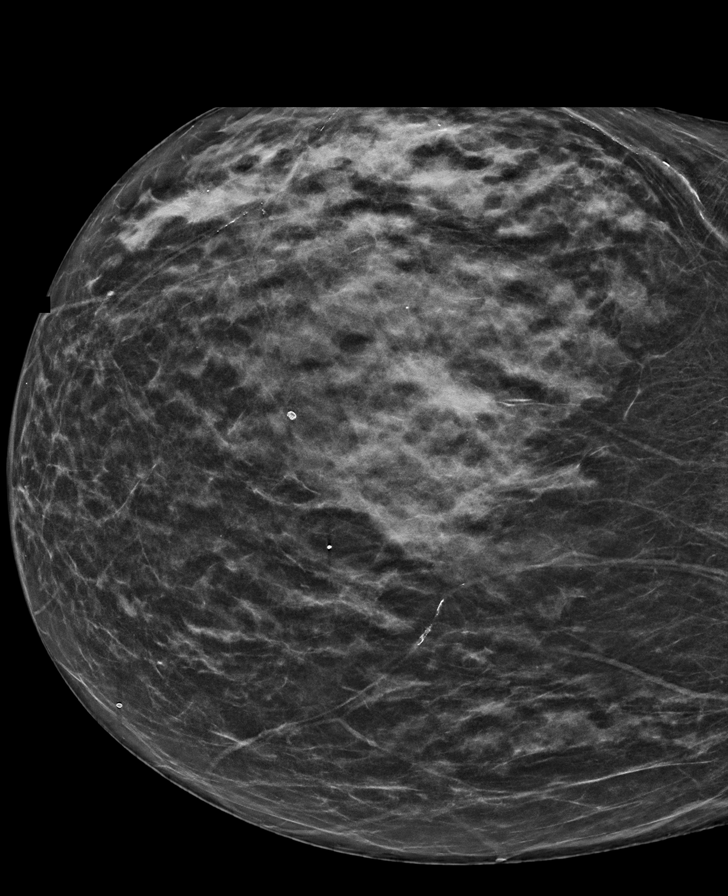

[L MLO synth-2D]
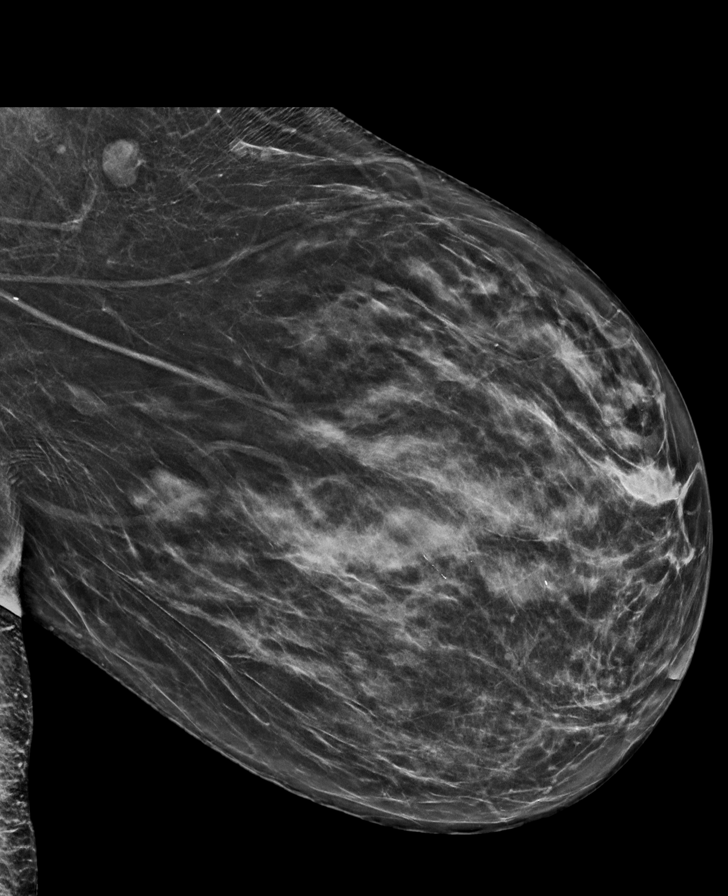

[R MLO synth-2D]
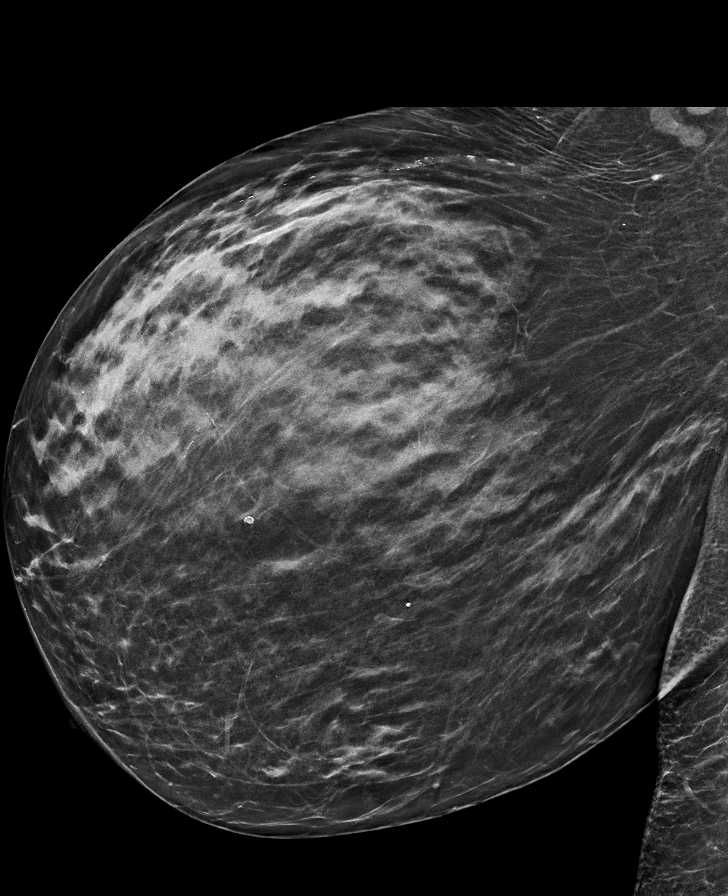

[L CC synth-2D]
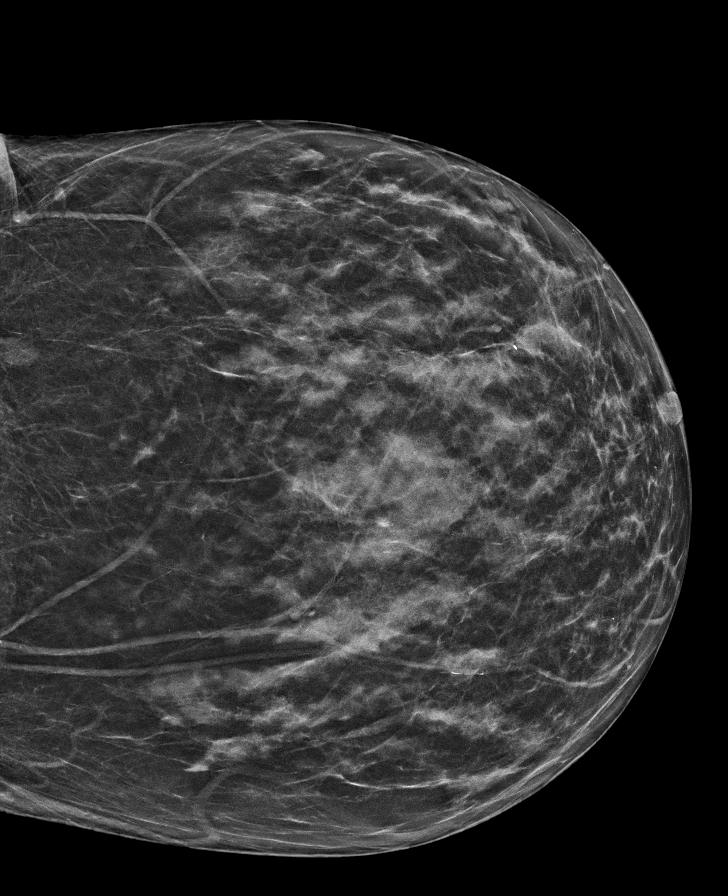

[R CC tomo · tomo slice 31/60.0]
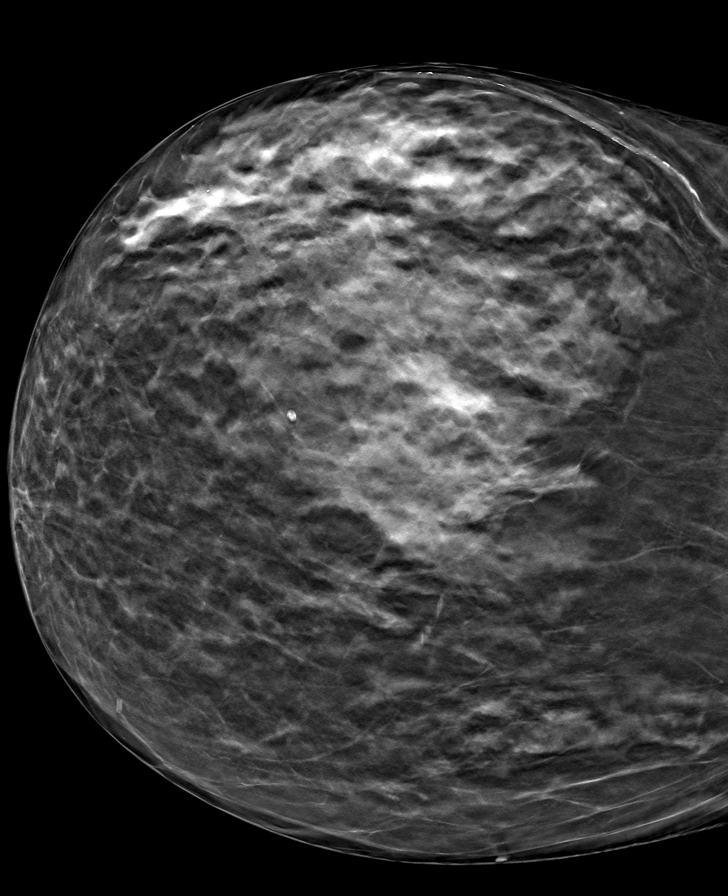

[R MLO tomo · tomo slice 21/40.0]
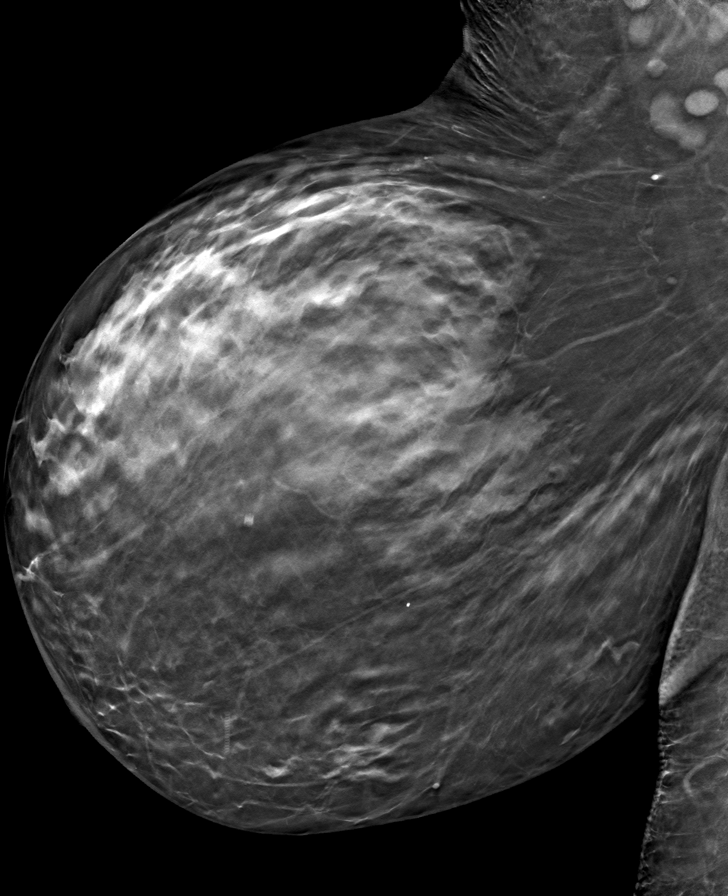

[L CC tomo · tomo slice 30/59.0]
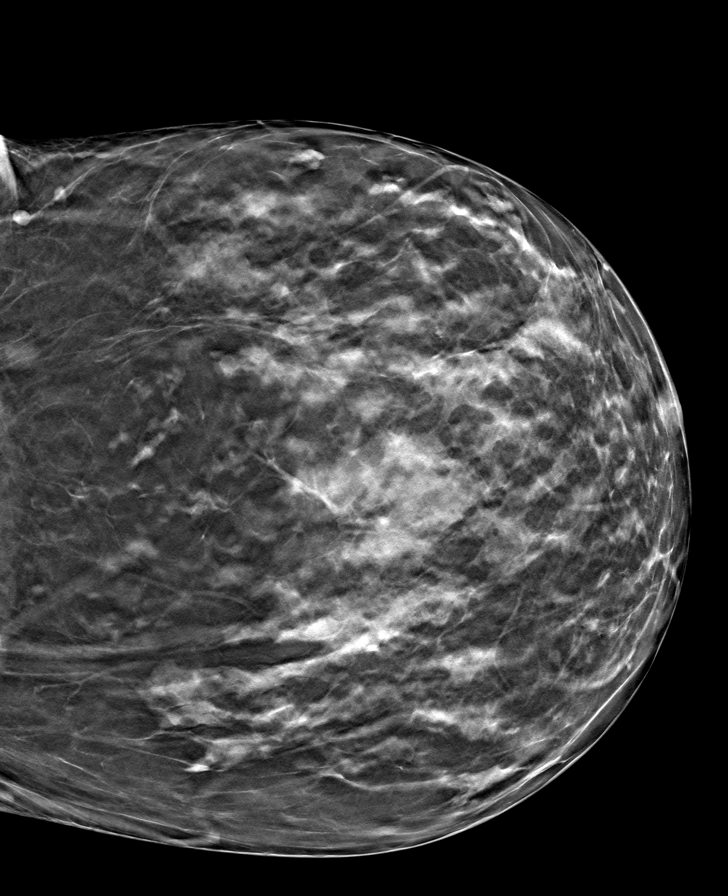

[L MLO tomo · tomo slice 36/71.0]
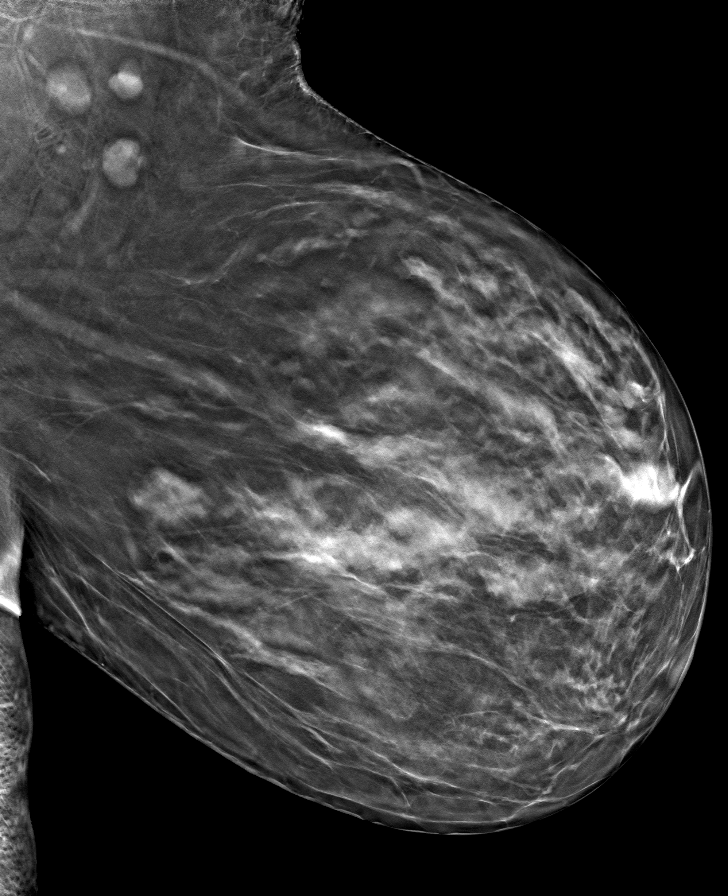

[8 of 24 positions shown; findings below may reference images not displayed]

ACR Breast Density Category c: The breast tissue is heterogeneously
dense, which may obscure small masses.
FINDINGS: There are no findings suspicious for malignancy.
IMPRESSION: No mammographic evidence of malignancy. A result letter of this
screening mammogram will be mailed directly to the patient.

RECOMMENDATION:
Screening mammogram in one year. (Code:[id number])

BI-RADS CATEGORY  1: Negative.

## 2023-03-29 DIAGNOSIS — I10 Essential (primary) hypertension: Secondary | ICD-10-CM | POA: Diagnosis not present

## 2023-03-29 DIAGNOSIS — Z79899 Other long term (current) drug therapy: Secondary | ICD-10-CM | POA: Diagnosis not present

## 2023-03-29 DIAGNOSIS — E1169 Type 2 diabetes mellitus with other specified complication: Secondary | ICD-10-CM | POA: Diagnosis not present

## 2023-03-30 ENCOUNTER — Other Ambulatory Visit: Payer: Self-pay | Admitting: Obstetrics & Gynecology

## 2023-03-30 DIAGNOSIS — Z1231 Encounter for screening mammogram for malignant neoplasm of breast: Secondary | ICD-10-CM

## 2023-04-05 DIAGNOSIS — E782 Mixed hyperlipidemia: Secondary | ICD-10-CM | POA: Diagnosis not present

## 2023-04-05 DIAGNOSIS — H547 Unspecified visual loss: Secondary | ICD-10-CM | POA: Diagnosis not present

## 2023-04-05 DIAGNOSIS — G4700 Insomnia, unspecified: Secondary | ICD-10-CM | POA: Diagnosis not present

## 2023-04-05 DIAGNOSIS — I10 Essential (primary) hypertension: Secondary | ICD-10-CM | POA: Diagnosis not present

## 2023-04-05 DIAGNOSIS — R195 Other fecal abnormalities: Secondary | ICD-10-CM | POA: Diagnosis not present

## 2023-04-05 DIAGNOSIS — Z6833 Body mass index (BMI) 33.0-33.9, adult: Secondary | ICD-10-CM | POA: Diagnosis not present

## 2023-04-05 DIAGNOSIS — I2584 Coronary atherosclerosis due to calcified coronary lesion: Secondary | ICD-10-CM | POA: Diagnosis not present

## 2023-04-05 DIAGNOSIS — I890 Lymphedema, not elsewhere classified: Secondary | ICD-10-CM | POA: Diagnosis not present

## 2023-04-05 DIAGNOSIS — E113293 Type 2 diabetes mellitus with mild nonproliferative diabetic retinopathy without macular edema, bilateral: Secondary | ICD-10-CM | POA: Diagnosis not present

## 2023-04-13 DIAGNOSIS — I890 Lymphedema, not elsewhere classified: Secondary | ICD-10-CM | POA: Diagnosis not present

## 2023-04-13 DIAGNOSIS — M6281 Muscle weakness (generalized): Secondary | ICD-10-CM | POA: Diagnosis not present

## 2023-05-09 ENCOUNTER — Ambulatory Visit: Payer: Medicare Other

## 2023-05-17 DIAGNOSIS — H6991 Unspecified Eustachian tube disorder, right ear: Secondary | ICD-10-CM | POA: Diagnosis not present

## 2023-05-17 DIAGNOSIS — H903 Sensorineural hearing loss, bilateral: Secondary | ICD-10-CM | POA: Diagnosis not present

## 2023-05-30 ENCOUNTER — Ambulatory Visit
Admission: RE | Admit: 2023-05-30 | Discharge: 2023-05-30 | Disposition: A | Discharge status: Home / Self Care | Payer: Medicare Other | Source: Ambulatory Visit | Attending: Obstetrics & Gynecology | Admitting: Obstetrics & Gynecology

## 2023-05-30 DIAGNOSIS — Z1231 Encounter for screening mammogram for malignant neoplasm of breast: Secondary | ICD-10-CM | POA: Diagnosis not present

## 2023-06-07 DIAGNOSIS — I890 Lymphedema, not elsewhere classified: Secondary | ICD-10-CM | POA: Diagnosis not present

## 2023-06-07 DIAGNOSIS — M6281 Muscle weakness (generalized): Secondary | ICD-10-CM | POA: Diagnosis not present

## 2023-06-25 DIAGNOSIS — E1169 Type 2 diabetes mellitus with other specified complication: Secondary | ICD-10-CM | POA: Diagnosis not present

## 2023-06-25 DIAGNOSIS — K529 Noninfective gastroenteritis and colitis, unspecified: Secondary | ICD-10-CM | POA: Diagnosis not present

## 2023-06-25 DIAGNOSIS — Z6832 Body mass index (BMI) 32.0-32.9, adult: Secondary | ICD-10-CM | POA: Diagnosis not present

## 2023-07-06 ENCOUNTER — Other Ambulatory Visit (HOSPITAL_BASED_OUTPATIENT_CLINIC_OR_DEPARTMENT_OTHER): Payer: Self-pay | Admitting: Obstetrics & Gynecology

## 2023-07-06 DIAGNOSIS — F5101 Primary insomnia: Secondary | ICD-10-CM

## 2023-07-06 NOTE — Telephone Encounter (Signed)
Annual exam Monday 07/09/23 will route to Dr Hyacinth Meeker for approval KW CMA

## 2023-07-09 ENCOUNTER — Encounter (HOSPITAL_BASED_OUTPATIENT_CLINIC_OR_DEPARTMENT_OTHER): Payer: Self-pay | Admitting: Obstetrics & Gynecology

## 2023-07-09 ENCOUNTER — Ambulatory Visit (HOSPITAL_BASED_OUTPATIENT_CLINIC_OR_DEPARTMENT_OTHER): Payer: Medicare Other | Admitting: Obstetrics & Gynecology

## 2023-07-09 VITALS — BP 130/76 | HR 66 | Ht 59.0 in | Wt 224.2 lb

## 2023-07-09 DIAGNOSIS — I890 Lymphedema, not elsewhere classified: Secondary | ICD-10-CM

## 2023-07-09 DIAGNOSIS — Z01419 Encounter for gynecological examination (general) (routine) without abnormal findings: Secondary | ICD-10-CM | POA: Diagnosis not present

## 2023-07-09 DIAGNOSIS — Z780 Asymptomatic menopausal state: Secondary | ICD-10-CM | POA: Diagnosis not present

## 2023-07-09 DIAGNOSIS — F5101 Primary insomnia: Secondary | ICD-10-CM

## 2023-07-09 DIAGNOSIS — Z872 Personal history of diseases of the skin and subcutaneous tissue: Secondary | ICD-10-CM | POA: Diagnosis not present

## 2023-07-09 NOTE — Progress Notes (Deleted)
82 y.o. G27P0003 Widowed White or Caucasian female here for breast and pelvic exam.  I am also following her for ***.  Denies vaginal bleeding.  Patient's last menstrual period was 12/18/2000.          Sexually active: {yes no:314532}  H/O STD:  {YES NO:22349}  Health Maintenance: PCP:  ****.  Last wellness appt was ***.  Did blood work at that appt:   Vaccines are up to date:  {YES NO:22349} Colonoscopy:  *** MMG:  05/30/2023 Negative BMD:  09/06/2021 Mild osteopenia Last pap smear:  ***.   H/o abnormal pap smear:      reports that she has never smoked. She has never used smokeless tobacco. She reports that she does not drink alcohol and does not use drugs.  Past Medical History:  Diagnosis Date   Abnormal Pap smear 4/82   colpo/ecc negative   Arthritis    knee   Cervical polyp 1/99   Colon polyps    Diabetes mellitus without complication (HCC)    pre-diabetes   Fibrocystic breast    Hives    undetermined-on 7 antihistamines   Hyperlipidemia    Hypertension    Postmenopausal bleeding 3/99   on HRT/endometrial biopsy-focal simple hyperplasia   Uterine fibroid    Vasculitis (HCC)    bilat legs   Vitamin D deficiency     Past Surgical History:  Procedure Laterality Date   BREAST BIOPSY Left    INCISION AND DRAINAGE     Bartholin cyst   TONSILLECTOMY AND ADENOIDECTOMY     as a child    Current Outpatient Medications  Medication Sig Dispense Refill   amLODipine (NORVASC) 10 MG tablet Take 1 tablet (10 mg total) by mouth daily. 90 tablet 3   aspirin 81 MG tablet Take 81 mg by mouth daily.     atorvastatin (LIPITOR) 40 MG tablet TAKE ONE TABLET BY MOUTH DAILY 90 tablet 3   carvedilol (COREG) 25 MG tablet Take 1 tablet (25 mg total) by mouth 2 (two) times daily. 180 tablet 3   EPIPEN 2-PAK 0.3 MG/0.3ML SOAJ Inject 0.3 mg into the muscle as needed (HIVES).     loratadine (CLARITIN) 10 MG tablet Take 1 tablet by mouth every evening.     olmesartan (BENICAR) 40 MG  tablet Take 40 mg by mouth daily.     ONETOUCH DELICA LANCETS 33G MISC   1   ONETOUCH VERIO test strip   1   zolpidem (AMBIEN) 5 MG tablet TAKE ONE TABLET BY MOUTH AT BEDTIME. 30 tablet 5   azithromycin (ZITHROMAX) 250 MG tablet Take 2 tabs po on day 1, then take one tab po daily (Patient not taking: Reported on 02/08/2023) 6 each 0   fluticasone (FLONASE) 50 MCG/ACT nasal spray Place 1 spray into both nostrils in the morning and at bedtime. (Patient not taking: Reported on 02/08/2023) 15.8 mL 0   metFORMIN (GLUCOPHAGE-XR) 500 MG 24 hr tablet Take 500 mg by mouth in the morning and at bedtime. (Patient not taking: Reported on 07/09/2023)  0   No current facility-administered medications for this visit.    Family History  Problem Relation Age of Onset   Pancreatic cancer Mother    Cancer Mother    Diabetes Mother    Hyperlipidemia Mother    Hypertension Mother    Heart attack Father    Ulcers Father    Heart disease Father    Kidney disease Father    Cancer Father  Hypertension Father    Mental retardation Daughter    Cancer Paternal Aunt        mouth   Breast cancer Neg Hx     Review of Systems  Exam:   BP (!) 222/70 (BP Location: Right Arm, Patient Position: Sitting, Cuff Size: Large)   Pulse 66   Ht 4\' 11"  (1.499 m) Comment: Reported  Wt 224 lb 3.2 oz (101.7 kg)   LMP 12/18/2000   BMI 45.28 kg/m   Height: 4\' 11"  (149.9 cm) (Reported)  General appearance: alert, cooperative and appears stated age Breasts: {Exam; breast:13139::"normal appearance, no masses or tenderness"} Abdomen: soft, non-tender; bowel sounds normal; no masses,  no organomegaly Lymph nodes: Cervical, supraclavicular, and axillary nodes normal.  No abnormal inguinal nodes palpated Neurologic: Grossly normal  Pelvic: External genitalia:  no lesions              Urethra:  normal appearing urethra with no masses, tenderness or lesions              Bartholins and Skenes: normal                 Vagina:  normal appearing vagina with atrophic changes ***and no discharge, no lesions              Cervix: {exam; cervix:14595}              Pap taken: {yes no:314532} Bimanual Exam:  Uterus:  {exam; uterus:12215}              Adnexa: {exam; adnexa:12223}               Rectovaginal: Confirms               Anus:  normal sphincter tone, no lesions  Chaperone, ***, CMA, was present for exam.  Assessment/Plan: There are no diagnoses linked to this encounter.

## 2023-07-09 NOTE — Progress Notes (Signed)
82 y.o. G27P0003 Widowed White or Caucasian female here for breast and pelvic exam.  Denies vaginal bleeding.  She and her daughter Misty Stanley are at [employer].    Still seeing Physical Therapy for lymphedema.    H/o chronic insomnia.  Just did RF for ambien for her.  She has been on this long term and requires this for sleeping.  Saw Dr. Sigmund Hazel last week.  Last blood work was done in the spring.  Seeing Dr. Ewing Schlein as well for follow up.  She is having issues with diarrhea that will last about a week and then will resolve.  This has become a bigger issues.  Is off metformin for a while and now is on glipizide to see if this will help.   Has experienced several issues with vulvar skin infections.  Would like my input.  Patient's last menstrual period was 12/18/2000.          Sexually active: No.  H/O STD:  no  Health Maintenance: PCP:  Dr. Sigmund Hazel  Vaccines are up to date:  pt aware I do not have dates for her pneumonia vaccinations.  She feels she's had these.   Colonoscopy:  2015 with Dr. Ewing Schlein MMG: 06/01/2023 BMD:  08/2021 with -1.3 Last pap smear:  2013   H/o abnormal pap smear:  remote hx    reports that she has never smoked. She has never used smokeless tobacco. She reports that she does not drink alcohol and does not use drugs.  Past Medical History:  Diagnosis Date   Abnormal Pap smear 4/82   colpo/ecc negative   Arthritis    knee   Cervical polyp 1/99   Colon polyps    Diabetes mellitus without complication (HCC)    pre-diabetes   Fibrocystic breast    Hives    undetermined-on 7 antihistamines   Hyperlipidemia    Hypertension    Postmenopausal bleeding 3/99   on HRT/endometrial biopsy-focal simple hyperplasia   Uterine fibroid    Vasculitis (HCC)    bilat legs   Vitamin D deficiency     Past Surgical History:  Procedure Laterality Date   BREAST BIOPSY Left    INCISION AND DRAINAGE     Bartholin cyst   TONSILLECTOMY AND ADENOIDECTOMY     as a child     Current Outpatient Medications  Medication Sig Dispense Refill   amLODipine (NORVASC) 10 MG tablet Take 1 tablet (10 mg total) by mouth daily. 90 tablet 3   aspirin 81 MG tablet Take 81 mg by mouth daily.     atorvastatin (LIPITOR) 40 MG tablet TAKE ONE TABLET BY MOUTH DAILY 90 tablet 3   carvedilol (COREG) 25 MG tablet Take 1 tablet (25 mg total) by mouth 2 (two) times daily. 180 tablet 3   EPIPEN 2-PAK 0.3 MG/0.3ML SOAJ Inject 0.3 mg into the muscle as needed (HIVES).     loratadine (CLARITIN) 10 MG tablet Take 1 tablet by mouth every evening.     olmesartan (BENICAR) 40 MG tablet Take 40 mg by mouth daily.     ONETOUCH DELICA LANCETS 33G MISC   1   ONETOUCH VERIO test strip   1   zolpidem (AMBIEN) 5 MG tablet TAKE ONE TABLET BY MOUTH AT BEDTIME. 30 tablet 5   metFORMIN (GLUCOPHAGE-XR) 500 MG 24 hr tablet Take 500 mg by mouth in the morning and at bedtime. (Patient not taking: Reported on 07/09/2023)  0   No current facility-administered medications for this  visit.    Family History  Problem Relation Age of Onset   Pancreatic cancer Mother    Cancer Mother    Diabetes Mother    Hyperlipidemia Mother    Hypertension Mother    Heart attack Father    Ulcers Father    Heart disease Father    Kidney disease Father    Cancer Father    Hypertension Father    Mental retardation Daughter    Cancer Paternal Aunt        mouth   Breast cancer Neg Hx     Review of Systems  Constitutional: Negative.   Genitourinary: Negative.     Exam:   BP 130/76 (BP Location: Right Arm, Patient Position: Sitting, Cuff Size: Large)   Pulse 66   Ht 4\' 11"  (1.499 m) Comment: Reported  Wt 224 lb 3.2 oz (101.7 kg)   LMP 12/18/2000   BMI 45.28 kg/m   Height: 4\' 11"  (149.9 cm) (Reported)  General appearance: alert, cooperative and appears stated age Breasts: normal appearance, no masses or tenderness Abdomen: soft, non-tender; bowel sounds normal; no masses,  no organomegaly Lymph nodes:  Cervical, supraclavicular, and axillary nodes normal.  No abnormal inguinal nodes palpated Neurologic: Grossly normal  Pelvic: External genitalia:  multiple sebaceous cysts on left inner labia majora              Urethra:  normal appearing urethra with no masses, tenderness or lesions              Bartholins and Skenes: normal                 Vagina: normal appearing vagina with atrophic changes and no discharge, no lesions              Cervix: no lesions              Pap taken: No. Bimanual Exam:  Uterus:  normal size, contour, position, consistency, mobility, non-tender              Adnexa: normal adnexa and no mass, fullness, tenderness               Rectovaginal: Confirms               Anus:  normal sphincter tone, no lesions  Chaperone, Ina Homes, CMA, was present for exam.  Assessment/Plan: 1. Encntr for gyn exam (general) (routine) w/o abn findings - Pap smear not indicated - Mammogram 05/2023 - Colonoscopy 2015.  Has upcoming appt with Dr. Ewing Schlein - Bone mineral density 2022 with mild osteopenia - lab work done with PCP, Dr. Sigmund Hazel in the spring - vaccines reviewed/updated  2. Hx of sebaceous cyst - discussed with pt reasons for removing.  For now, feel should monitor but she knows to call with any questions/concerns   3. Lymphedema - followed by PT  4. Postmenopausal - no HRT  5. Primary insomnia - RF for ambien just completed for pt.  Using 5mg  dosage.

## 2023-07-18 DIAGNOSIS — H5203 Hypermetropia, bilateral: Secondary | ICD-10-CM | POA: Diagnosis not present

## 2023-07-18 DIAGNOSIS — E113291 Type 2 diabetes mellitus with mild nonproliferative diabetic retinopathy without macular edema, right eye: Secondary | ICD-10-CM | POA: Diagnosis not present

## 2023-07-18 DIAGNOSIS — Z961 Presence of intraocular lens: Secondary | ICD-10-CM | POA: Diagnosis not present

## 2023-07-18 DIAGNOSIS — H349 Unspecified retinal vascular occlusion: Secondary | ICD-10-CM | POA: Diagnosis not present

## 2023-07-19 ENCOUNTER — Other Ambulatory Visit (HOSPITAL_BASED_OUTPATIENT_CLINIC_OR_DEPARTMENT_OTHER): Payer: Self-pay | Admitting: Gastroenterology

## 2023-07-19 DIAGNOSIS — K862 Cyst of pancreas: Secondary | ICD-10-CM

## 2023-07-19 DIAGNOSIS — R197 Diarrhea, unspecified: Secondary | ICD-10-CM | POA: Diagnosis not present

## 2023-07-30 DIAGNOSIS — D1801 Hemangioma of skin and subcutaneous tissue: Secondary | ICD-10-CM | POA: Diagnosis not present

## 2023-07-30 DIAGNOSIS — L821 Other seborrheic keratosis: Secondary | ICD-10-CM | POA: Diagnosis not present

## 2023-07-30 DIAGNOSIS — L570 Actinic keratosis: Secondary | ICD-10-CM | POA: Diagnosis not present

## 2023-07-30 DIAGNOSIS — I872 Venous insufficiency (chronic) (peripheral): Secondary | ICD-10-CM | POA: Diagnosis not present

## 2023-07-30 DIAGNOSIS — L814 Other melanin hyperpigmentation: Secondary | ICD-10-CM | POA: Diagnosis not present

## 2023-08-01 ENCOUNTER — Ambulatory Visit (HOSPITAL_COMMUNITY)
Admission: RE | Admit: 2023-08-01 | Discharge: 2023-08-01 | Disposition: A | Discharge status: Home / Self Care | Payer: Medicare Other | Source: Ambulatory Visit | Attending: Gastroenterology | Admitting: Gastroenterology

## 2023-08-01 DIAGNOSIS — E119 Type 2 diabetes mellitus without complications: Secondary | ICD-10-CM | POA: Diagnosis not present

## 2023-08-01 DIAGNOSIS — K7689 Other specified diseases of liver: Secondary | ICD-10-CM | POA: Diagnosis not present

## 2023-08-01 DIAGNOSIS — K862 Cyst of pancreas: Secondary | ICD-10-CM | POA: Diagnosis not present

## 2023-08-01 DIAGNOSIS — D259 Leiomyoma of uterus, unspecified: Secondary | ICD-10-CM | POA: Diagnosis not present

## 2023-08-01 DIAGNOSIS — K529 Noninfective gastroenteritis and colitis, unspecified: Secondary | ICD-10-CM | POA: Diagnosis not present

## 2023-08-01 DIAGNOSIS — R197 Diarrhea, unspecified: Secondary | ICD-10-CM | POA: Insufficient documentation

## 2023-08-01 LAB — POCT I-STAT CREATININE: Creatinine, Ser: 0.8 mg/dL (ref 0.44–1.00)

## 2023-08-01 MED ORDER — SODIUM CHLORIDE (PF) 0.9 % IJ SOLN
INTRAMUSCULAR | Status: AC
  Filled 2023-08-01: qty 50

## 2023-08-01 MED ORDER — IOHEXOL 300 MG/ML  SOLN
100.0000 mL | Freq: Once | INTRAMUSCULAR | Status: AC | PRN
  Administered 2023-08-01: 100 mL via INTRAVENOUS

## 2023-08-31 ENCOUNTER — Other Ambulatory Visit: Payer: Self-pay | Admitting: Gastroenterology

## 2023-08-31 DIAGNOSIS — K869 Disease of pancreas, unspecified: Secondary | ICD-10-CM | POA: Diagnosis not present

## 2023-09-11 ENCOUNTER — Encounter (HOSPITAL_COMMUNITY): Payer: Self-pay | Admitting: Gastroenterology

## 2023-09-11 ENCOUNTER — Other Ambulatory Visit: Payer: Self-pay

## 2023-09-11 DIAGNOSIS — Z23 Encounter for immunization: Secondary | ICD-10-CM | POA: Diagnosis not present

## 2023-09-11 NOTE — Progress Notes (Addendum)
COVID Vaccine Completed:  Yes  Date of COVID positive in last 90 days:  PCP - Gweneth Dimitri, MD Cardiologist - Lance Muss, MD  Chest x-ray -  EKG - 02-08-23 Epic Stress Test -  ECHO - 08-13-17 Epic Cardiac Cath -  Pacemaker/ICD device last checked: Spinal Cord Stimulator: Cardiac Monitor - 05-01-13 Epic  Bowel Prep -   Sleep Study -  CPAP -   Fasting Blood Sugar - 100 to 150 Checks Blood Sugar - checks occasionally  Last dose of GLP1 agonist-  N/A GLP1 instructions:  N/A   Last dose of SGLT-2 inhibitors-  N/A SGLT-2 instructions: N/A   Blood Thinner Instructions:  Time Aspirin Instructions:  Aspirin 81.  Patient will call today to see if she needs to discontinue Last Dose:  Activity level:  Can go up a flight of stairs and perform activities of daily living without stopping and without symptoms of chest pain or shortness of breath.  Anesthesia review:  Hx of mumur, lymphedema, HTN, DM  Patient denies shortness of breath, fever, cough and chest pain at PAT appointment (completed over the phone)  Patient verbalized understanding of instructions that were given to them at the PAT appointment. Patient was also instructed that they will need to review over the PAT instructions again at home before surgery.

## 2023-09-18 NOTE — Anesthesia Preprocedure Evaluation (Signed)
Anesthesia Evaluation  Patient identified by MRN, date of birth, ID band Patient awake    Reviewed: Allergy & Precautions, NPO status , Patient's Chart, lab work & pertinent test results  Airway Mallampati: II  TM Distance: >3 FB Neck ROM: Full    Dental no notable dental hx.    Pulmonary neg pulmonary ROS   Pulmonary exam normal        Cardiovascular hypertension, Pt. on medications and Pt. on home beta blockers Normal cardiovascular exam  ECHO: Normal LVF with EF 55-60%, mild MR, grade 2DD, mild LAE, mildly  dilated RV and trivial TR.     Neuro/Psych negative neurological ROS  negative psych ROS   GI/Hepatic negative GI ROS, Neg liver ROS,,,  Endo/Other  diabetes, Oral Hypoglycemic Agents  Morbid obesity  Renal/GU negative Renal ROS     Musculoskeletal  (+) Arthritis ,    Abdominal  (+) + obese  Peds  Hematology negative hematology ROS (+)   Anesthesia Other Findings pancreatic lesion  Reproductive/Obstetrics                             Anesthesia Physical Anesthesia Plan  ASA: 3  Anesthesia Plan: MAC   Post-op Pain Management:    Induction:   PONV Risk Score and Plan: 2 and Propofol infusion and Treatment may vary due to age or medical condition  Airway Management Planned: Nasal Cannula  Additional Equipment:   Intra-op Plan:   Post-operative Plan:   Informed Consent: I have reviewed the patients History and Physical, chart, labs and discussed the procedure including the risks, benefits and alternatives for the proposed anesthesia with the patient or authorized representative who has indicated his/her understanding and acceptance.     Dental advisory given  Plan Discussed with: CRNA  Anesthesia Plan Comments:        Anesthesia Quick Evaluation

## 2023-09-19 ENCOUNTER — Other Ambulatory Visit: Payer: Self-pay

## 2023-09-19 ENCOUNTER — Encounter (HOSPITAL_COMMUNITY)
Admission: RE | Disposition: A | Discharge status: Home / Self Care | Payer: Self-pay | Source: Home / Self Care | Attending: Gastroenterology

## 2023-09-19 ENCOUNTER — Ambulatory Visit (HOSPITAL_BASED_OUTPATIENT_CLINIC_OR_DEPARTMENT_OTHER): Payer: Self-pay | Admitting: Certified Registered Nurse Anesthetist

## 2023-09-19 ENCOUNTER — Encounter (HOSPITAL_COMMUNITY): Payer: Self-pay | Admitting: Gastroenterology

## 2023-09-19 ENCOUNTER — Ambulatory Visit (HOSPITAL_COMMUNITY): Payer: Self-pay | Admitting: Certified Registered Nurse Anesthetist

## 2023-09-19 ENCOUNTER — Ambulatory Visit (HOSPITAL_COMMUNITY)
Admission: RE | Admit: 2023-09-19 | Discharge: 2023-09-19 | Disposition: A | Discharge status: Home / Self Care | Payer: Medicare Other | Attending: Gastroenterology | Admitting: Gastroenterology

## 2023-09-19 DIAGNOSIS — E119 Type 2 diabetes mellitus without complications: Secondary | ICD-10-CM | POA: Diagnosis not present

## 2023-09-19 DIAGNOSIS — Z7982 Long term (current) use of aspirin: Secondary | ICD-10-CM | POA: Insufficient documentation

## 2023-09-19 DIAGNOSIS — I10 Essential (primary) hypertension: Secondary | ICD-10-CM

## 2023-09-19 DIAGNOSIS — Z79899 Other long term (current) drug therapy: Secondary | ICD-10-CM | POA: Diagnosis not present

## 2023-09-19 DIAGNOSIS — K8689 Other specified diseases of pancreas: Secondary | ICD-10-CM | POA: Insufficient documentation

## 2023-09-19 DIAGNOSIS — K869 Disease of pancreas, unspecified: Secondary | ICD-10-CM | POA: Diagnosis not present

## 2023-09-19 DIAGNOSIS — Z7984 Long term (current) use of oral hypoglycemic drugs: Secondary | ICD-10-CM | POA: Insufficient documentation

## 2023-09-19 DIAGNOSIS — Z6841 Body Mass Index (BMI) 40.0 and over, adult: Secondary | ICD-10-CM | POA: Diagnosis not present

## 2023-09-19 DIAGNOSIS — K862 Cyst of pancreas: Secondary | ICD-10-CM | POA: Diagnosis not present

## 2023-09-19 HISTORY — PX: ESOPHAGOGASTRODUODENOSCOPY (EGD) WITH PROPOFOL: SHX5813

## 2023-09-19 HISTORY — PX: UPPER ESOPHAGEAL ENDOSCOPIC ULTRASOUND (EUS): SHX6562

## 2023-09-19 HISTORY — PX: FINE NEEDLE ASPIRATION: SHX5430

## 2023-09-19 LAB — GLUCOSE, CAPILLARY: Glucose-Capillary: 134 mg/dL — ABNORMAL HIGH (ref 70–99)

## 2023-09-19 SURGERY — UPPER ESOPHAGEAL ENDOSCOPIC ULTRASOUND (EUS)
Anesthesia: Monitor Anesthesia Care

## 2023-09-19 MED ORDER — PROPOFOL 500 MG/50ML IV EMUL
INTRAVENOUS | Status: DC | PRN
  Administered 2023-09-19: 150 ug/kg/min via INTRAVENOUS

## 2023-09-19 MED ORDER — LACTATED RINGERS IV SOLN
INTRAVENOUS | Status: AC | PRN
Start: 2023-09-19 — End: 2023-09-19
  Administered 2023-09-19: 1000 mL via INTRAVENOUS

## 2023-09-19 MED ORDER — SODIUM CHLORIDE 0.9 % IV SOLN: INTRAVENOUS | Status: DC

## 2023-09-19 MED ORDER — GLYCOPYRROLATE PF 0.2 MG/ML IJ SOSY
PREFILLED_SYRINGE | INTRAMUSCULAR | Status: DC | PRN
  Administered 2023-09-19 (×2): .1 mg via INTRAVENOUS

## 2023-09-19 MED ORDER — CIPROFLOXACIN IN D5W 400 MG/200ML IV SOLN
INTRAVENOUS | Status: AC
  Filled 2023-09-19: qty 200

## 2023-09-19 MED ORDER — ONDANSETRON HCL 4 MG/2ML IJ SOLN
INTRAMUSCULAR | Status: DC | PRN
  Administered 2023-09-19: 4 mg via INTRAVENOUS

## 2023-09-19 MED ORDER — PROPOFOL 1000 MG/100ML IV EMUL
INTRAVENOUS | Status: AC
  Filled 2023-09-19: qty 100

## 2023-09-19 MED ORDER — PROPOFOL 10 MG/ML IV BOLUS
INTRAVENOUS | Status: DC | PRN
  Administered 2023-09-19 (×2): 10 mg via INTRAVENOUS
  Administered 2023-09-19: 50 mg via INTRAVENOUS

## 2023-09-19 MED ORDER — LIDOCAINE 2% (20 MG/ML) 5 ML SYRINGE
INTRAMUSCULAR | Status: DC | PRN
  Administered 2023-09-19: 100 mg via INTRAVENOUS

## 2023-09-19 NOTE — Transfer of Care (Signed)
Immediate Anesthesia Transfer of Care Note  Patient: LUS KRIEGEL  Procedure(s) Performed: Procedure(s): UPPER ESOPHAGEAL ENDOSCOPIC ULTRASOUND (EUS) (Bilateral) FINE NEEDLE ASPIRATION (FNA) LINEAR (N/A)  Patient Location: PACU  Anesthesia Type:MAC  Level of Consciousness: Patient easily awoken comfortable, cooperative, following commands, responds to stimulation.   Airway & Oxygen Therapy: Patient spontaneously breathing, ventilating well, oxygen via simple oxygen mask.  Post-op Assessment: Report given to PACU RN, vital signs reviewed and stable, moving all extremities.   Post vital signs: Reviewed and stable.  Complications: No apparent anesthesia complications Last Vitals:  Vitals Value Taken Time  BP 131/49 09/19/23 0916  Temp    Pulse 62 09/19/23 0918  Resp 17 09/19/23 0918  SpO2 95 % 09/19/23 0918  Vitals shown include unfiled device data.  Last Pain:  Vitals:   09/19/23 0741  TempSrc: Tympanic  PainSc: 0-No pain         Complications: No notable events documented.

## 2023-09-19 NOTE — Anesthesia Procedure Notes (Signed)
Procedure Name: MAC Date/Time: 09/19/2023 8:35 AM  Performed by: Ludwig Lean, CRNAPre-anesthesia Checklist: Patient identified, Emergency Drugs available, Suction available and Patient being monitored Patient Re-evaluated:Patient Re-evaluated prior to induction Oxygen Delivery Method: Simple face mask Placement Confirmation: positive ETCO2 and breath sounds checked- equal and bilateral Dental Injury: Teeth and Oropharynx as per pre-operative assessment

## 2023-09-19 NOTE — Op Note (Signed)
[facility] Patient Name: Alexandra Williams Procedure Date: 09/19/2023 MRN: [id number] Attending MD: Willis Modena , MD, [id number] Date of Birth: 1941-07-28 CSN: [id number] Age: 82 Admit Type: Outpatient Procedure:                Upper EUS Indications:              Suspected esophageal neoplasm Providers:                Willis Modena, MD, Stephens Shire RN, RN, Suzy Bouchard, RN, Kandice Robinsons, Technician Referring MD:             Willis Modena, MD Medicines:                Monitored Anesthesia Care Complications:            No immediate complications. Estimated Blood Loss:     Estimated blood loss: none. Procedure:                Pre-Anesthesia Assessment:                           - Prior to the procedure, a History and Physical                            was performed, and patient medications and                            allergies were reviewed. The patient's tolerance of                            previous anesthesia was also reviewed. The risks                            and benefits of the procedure and the sedation                            options and risks were discussed with the patient.                            All questions were answered, and informed consent                            was obtained. Prior Anticoagulants: The patient has                            taken no anticoagulant or antiplatelet agents. ASA                            Grade Assessment: III - A patient with severe                            systemic disease. After reviewing the risks and  benefits, the patient was deemed in satisfactory                            condition to undergo the procedure.                           After obtaining informed consent, the endoscope was                            passed under direct vision. Throughout the                            procedure, the patient's blood pressure, pulse, and                             oxygen saturations were monitored continuously. The                            [id number] ([id number]) Olympus linear ultrasound scope                            was introduced through the mouth, and advanced to                            the second part of duodenum. The upper EUS was                            accomplished without difficulty. The patient                            tolerated the procedure well. Scope In: Scope Out: Findings:      ENDOSONOGRAPHIC FINDING: :      There was no sign of significant endosonographic abnormality in the left       lobe of the liver.      No lymphadenopathy seen.      An anechoic lesion suggestive of a cyst was identified in the pancreatic       body. It is not in obvious communication with the pancreatic duct. The       lesion measured 23 mm by 15 mm in maximal cross-sectional diameter.       There were a few compartments thinly septated. The outer wall of the       lesion was thin. There was no associated mass. There was no internal       debris within the fluid-filled cavity. On underside of pancreas, not       amenable to biopsy.      A round mass was identified in the pancreatic body. The mass was       hypoechoic. The mass measured 8 mm by 6 mm in maximal cross-sectional       diameter. The endosonographic borders were well-defined. An intact       interface was seen between the mass and the superior mesenteric artery,       celiac trunk, portal vein, superior mesenteric vein and splenic vein       suggesting a lack of invasion. The remainder of the pancreas was  examined. The endosonographic appearance of parenchyma and the upstream       pancreatic duct indicated no duct dilation. Fine needle aspiration for       cytology was performed. Color Doppler imaging was utilized prior to       needle puncture to confirm a lack of significant vascular structures       within the needle path. Three passes were made with the 25  gauge needle       using a transgastric approach. A stylet was used. A preliminary       cytologic examination was not performed. Final cytology results are       pending. Impression:               - There was no evidence of significant pathology in                            the left lobe of the liver.                           - A cystic lesion was seen in the pancreatic body.                           - A mass was identified in the pancreatic body.                            Fine needle aspiration performed. Moderate Sedation:      None Recommendation:           - Patient has a contact number available for                            emergencies. The signs and symptoms of potential                            delayed complications were discussed with the                            patient. Return to normal activities tomorrow.                            Written discharge instructions were provided to the                            patient.                           - Discharge patient to home (via wheelchair).                           - Resume previous diet today.                           - Continue present medications.                           - Await cytology results.                           -  Return to GI clinic after studies are complete. Procedure Code(s):        --- Professional ---                           [phone], Esophagogastroduodenoscopy, flexible,                            transoral; with transendoscopic ultrasound-guided                            intramural or transmural fine needle                            aspiration/biopsy(s) (includes endoscopic                            ultrasound examination of the esophagus, stomach,                            and either the duodenum or a surgically altered                            stomach where the jejunum is examined distal to the                            anastomosis) Diagnosis Code(s):        --- Professional ---                            U04.5, Cyst of pancreas                           K86.89, Other specified diseases of pancreas CPT copyright 2022 American [employer] Association. All rights reserved. The codes documented in this report are preliminary and upon coder review may  be revised to meet current compliance requirements. Willis Modena, MD 09/19/2023 9:30:23 AM This report has been signed electronically. Number of Addenda: 0

## 2023-09-19 NOTE — Discharge Instructions (Signed)
YOU HAD AN ENDOSCOPIC PROCEDURE TODAY: Refer to the procedure report and other information in the discharge instructions given to you for any specific questions about what was found during the examination. If this information does not answer your questions, please call [facility] office at [phone] to clarify.   YOU SHOULD EXPECT: Some feelings of bloating in the abdomen. Passage of more gas than usual. Walking can help get rid of the air that was put into your GI tract during the procedure and reduce the bloating. If you had a lower endoscopy (such as a colonoscopy or flexible sigmoidoscopy) you may notice spotting of blood in your stool or on the toilet paper. Some abdominal soreness may be present for a day or two, also.  DIET: Your first meal following the procedure should be a light meal and then it is ok to progress to your normal diet. A half-sandwich or bowl of soup is an example of a good first meal. Heavy or fried foods are harder to digest and may make you feel nauseous or bloated. Drink plenty of fluids but you should avoid alcoholic beverages for 24 hours. If you had a esophageal dilation, please see attached instructions for diet.    ACTIVITY: Your care partner should take you home directly after the procedure. You should plan to take it easy, moving slowly for the rest of the day. You can resume normal activity the day after the procedure however YOU SHOULD NOT DRIVE, use power tools, machinery or perform tasks that involve climbing or major physical exertion for 24 hours (because of the sedation medicines used during the test).   SYMPTOMS TO REPORT IMMEDIATELY: A gastroenterologist can be reached at any hour. Please call [phone]  for any of the following symptoms:  Following lower endoscopy (colonoscopy, flexible sigmoidoscopy) Excessive amounts of blood in the stool  Significant tenderness, worsening of abdominal pains  Swelling of the abdomen that is new, acute  Fever of 100  or higher  Following upper endoscopy (EGD, EUS, ERCP, esophageal dilation) Vomiting of blood or coffee ground material  New, significant abdominal pain  New, significant chest pain or pain under the shoulder blades  Painful or persistently difficult swallowing  New shortness of breath  Black, tarry-looking or red, bloody stools  FOLLOW UP:  If any biopsies were taken you will be contacted by phone or by letter within the next 1-3 weeks. Call [phone]  if you have not heard about the biopsies in 3 weeks.  Please also call with any specific questions about appointments or follow up tests. YOU HAD AN ENDOSCOPIC PROCEDURE TODAY: Refer to the procedure report and other information in the discharge instructions given to you for any specific questions about what was found during the examination. If this information does not answer your questions, please call [facility] office at [phone] to clarify. YOU HAD AN ENDOSCOPIC PROCEDURE TODAY: Refer to the procedure report and other information in the discharge instructions given to you for any specific questions about what was found during the examination. If this information does not answer your questions, please call [facility] office at [phone] to clarify.   YOU SHOULD EXPECT: Some feelings of bloating in the abdomen. Passage of more gas than usual. Walking can help get rid of the air that was put into your GI tract during the procedure and reduce the bloating. If you had a lower endoscopy (such as a colonoscopy or flexible sigmoidoscopy) you may notice spotting of blood in your stool  or on the toilet paper. Some abdominal soreness may be present for a day or two, also.  DIET: Your first meal following the procedure should be a light meal and then it is ok to progress to your normal diet. A half-sandwich or bowl of soup is an example of a good first meal. Heavy or fried foods are harder to digest and may make you feel nauseous or bloated. Drink plenty  of fluids but you should avoid alcoholic beverages for 24 hours. If you had a esophageal dilation, please see attached instructions for diet.    ACTIVITY: Your care partner should take you home directly after the procedure. You should plan to take it easy, moving slowly for the rest of the day. You can resume normal activity the day after the procedure however YOU SHOULD NOT DRIVE, use power tools, machinery or perform tasks that involve climbing or major physical exertion for 24 hours (because of the sedation medicines used during the test).   SYMPTOMS TO REPORT IMMEDIATELY: A gastroenterologist can be reached at any hour. Please call [phone]  for any of the following symptoms:  Following lower endoscopy (colonoscopy, flexible sigmoidoscopy) Excessive amounts of blood in the stool  Significant tenderness, worsening of abdominal pains  Swelling of the abdomen that is new, acute  Fever of 100 or higher  Following upper endoscopy (EGD, EUS, ERCP, esophageal dilation) Vomiting of blood or coffee ground material  New, significant abdominal pain  New, significant chest pain or pain under the shoulder blades  Painful or persistently difficult swallowing  New shortness of breath  Black, tarry-looking or red, bloody stools  FOLLOW UP:  If any biopsies were taken you will be contacted by phone or by letter within the next 1-3 weeks. Call [phone]  if you have not heard about the biopsies in 3 weeks.  Please also call with any specific questions about appointments or follow up tests. YOU HAD AN ENDOSCOPIC PROCEDURE TODAY: Refer to the procedure report and other information in the discharge instructions given to you for any specific questions about what was found during the examination. If this information does not answer your questions, please call [facility] office at [phone] to clarify.

## 2023-09-19 NOTE — H&P (Signed)
[facility] H/P Note  Chief Complaint: pancreatic lesion  HPI: Alexandra Williams is an 82 y.o. female.  Seen for pancreatic cyst and lesion in body of pancreas.  No symptoms.  No prior history of pancreatitis.  No family history of pancreatitis or pancreatic cancer.  Past Medical History:  Diagnosis Date   Abnormal Pap smear 4/82   colpo/ecc negative   Arthritis    knee   Cervical polyp 1/99   Colon polyps    Diabetes mellitus without complication (HCC)    pre-diabetes   Fibrocystic breast    Hives    undetermined-on 7 antihistamines   Hyperlipidemia    Hypertension    Postmenopausal bleeding 3/99   on HRT/endometrial biopsy-focal simple hyperplasia   Uterine fibroid    Vasculitis (HCC)    bilat legs   Vitamin D deficiency     Past Surgical History:  Procedure Laterality Date   BREAST BIOPSY Left    INCISION AND DRAINAGE     Bartholin cyst   TONSILLECTOMY AND ADENOIDECTOMY     as a child    Medications Prior to Admission  Medication Sig Dispense Refill   amLODipine (NORVASC) 10 MG tablet Take 1 tablet (10 mg total) by mouth daily. 90 tablet 3   aspirin 81 MG tablet Take 81 mg by mouth daily.     atorvastatin (LIPITOR) 40 MG tablet TAKE ONE TABLET BY MOUTH DAILY 90 tablet 3   carvedilol (COREG) 25 MG tablet Take 1 tablet (25 mg total) by mouth 2 (two) times daily. 180 tablet 3   loratadine (CLARITIN) 10 MG tablet Take 1 tablet by mouth every evening.     olmesartan (BENICAR) 40 MG tablet Take 40 mg by mouth daily.     ONETOUCH DELICA LANCETS 33G MISC   1   ONETOUCH VERIO test strip   1   zolpidem (AMBIEN) 5 MG tablet TAKE ONE TABLET BY MOUTH AT BEDTIME. 30 tablet 5   EPIPEN 2-PAK 0.3 MG/0.3ML SOAJ Inject 0.3 mg into the muscle as needed (HIVES).     metFORMIN (GLUCOPHAGE-XR) 500 MG 24 hr tablet Take 500 mg by mouth in the morning and at bedtime. (Patient not taking: Reported on 07/09/2023)  0    Allergies:  Allergies  Allergen Reactions   Codeine Hives    Peanuts [Peanut Oil] Hives   Shellfish Allergy Hives   Sulfites Hives    preservatives preservatives   Tessalon [Benzonatate] Hives and Itching   Amoxicillin-Pot Clavulanate Hives   Hylan G-F 20    Tramadol Other (See Comments)    Does not tolerate well feels funny    Elemental Sulfur Itching    Family History  Problem Relation Age of Onset   Pancreatic cancer Mother    Cancer Mother    Diabetes Mother    Hyperlipidemia Mother    Hypertension Mother    Heart attack Father    Ulcers Father    Heart disease Father    Kidney disease Father    Cancer Father    Hypertension Father    Mental retardation Daughter    Cancer Paternal Aunt        mouth   Breast cancer Neg Hx     Social History:  reports that she has never smoked. She has never used smokeless tobacco. She reports that she does not drink alcohol and does not use drugs.   ROS: As per HPI, all others negative   Blood pressure (!) 163/57, pulse 60, temperature  97.6 F (36.4 C), temperature source Tympanic, resp. rate 20, height 4\' 10"  (1.473 m), weight 100.7 kg, last menstrual period 12/18/2000, SpO2 97%. General appearance: overweight, elderly, NAD HEENT:  [place]/AT, anicteric sclera CV:  Regular LUNGS:  No respiratory distress ABD:  Soft, non-tender NEURO:  No encephalopathy  Results for orders placed or performed during the hospital encounter of 09/19/23 (from the past 48 hour(s))  Glucose, capillary     Status: Abnormal   Collection Time: 09/19/23  7:37 AM  Result Value Ref Range   Glucose-Capillary 134 (H) 70 - 99 mg/dL    Comment: Glucose reference range applies only to samples taken after fasting for at least 8 hours.   No results found.  Assessment/Plan   Pancreatic cysts. Solid pancreatic body lesion most consistent with neuroendocrine tumor. Upper endoscopic ultrasound with possible fine needle aspiration, possible pancreatic cyst aspiration. Risks (bleeding, infection, bowel perforation that  could require surgery, sedation-related changes in cardiopulmonary systems), benefits (identification and possible treatment of source of symptoms, exclusion of certain causes of symptoms), and alternatives (watchful waiting, radiographic imaging studies, empiric medical treatment) of upper endoscopy with ultrasound and possible cyst aspiration, possible fine needle aspiration (EUS +/- FNA) were explained to patient/family in detail and patient wishes to proceed.   Freddy Jaksch 09/19/2023, 8:24 AM

## 2023-09-20 NOTE — Anesthesia Postprocedure Evaluation (Signed)
Anesthesia Post Note  Patient: ANYSIA CHOI  Procedure(s) Performed: UPPER ESOPHAGEAL ENDOSCOPIC ULTRASOUND (EUS) (Bilateral) FINE NEEDLE ASPIRATION (FNA) LINEAR     Patient location during evaluation: Endoscopy Anesthesia Type: MAC Level of consciousness: awake Pain management: pain level controlled Vital Signs Assessment: post-procedure vital signs reviewed and stable Respiratory status: spontaneous breathing, nonlabored ventilation and respiratory function stable Cardiovascular status: blood pressure returned to baseline and stable Postop Assessment: no apparent nausea or vomiting Anesthetic complications: no   No notable events documented.  Last Vitals:  Vitals:   09/19/23 0930 09/19/23 0940  BP: (!) 145/59 (!) 184/88  Pulse: (!) 58 61  Resp: 18 16  Temp:    SpO2: 94% 96%    Last Pain:  Vitals:   09/19/23 0940  TempSrc:   PainSc: 0-No pain                 Thimothy Barretta P Mert Dietrick

## 2023-09-22 ENCOUNTER — Encounter (HOSPITAL_COMMUNITY): Payer: Self-pay | Admitting: Gastroenterology

## 2023-09-24 LAB — CYTOLOGY - NON PAP

## 2023-10-02 ENCOUNTER — Other Ambulatory Visit (HOSPITAL_COMMUNITY): Payer: Self-pay | Admitting: Gastroenterology

## 2023-10-02 DIAGNOSIS — K869 Disease of pancreas, unspecified: Secondary | ICD-10-CM

## 2023-10-03 DIAGNOSIS — Z6841 Body Mass Index (BMI) 40.0 and over, adult: Secondary | ICD-10-CM | POA: Diagnosis not present

## 2023-10-03 DIAGNOSIS — Z0000 Encounter for general adult medical examination without abnormal findings: Secondary | ICD-10-CM | POA: Diagnosis not present

## 2023-10-03 DIAGNOSIS — E113293 Type 2 diabetes mellitus with mild nonproliferative diabetic retinopathy without macular edema, bilateral: Secondary | ICD-10-CM | POA: Diagnosis not present

## 2023-10-03 DIAGNOSIS — Z1331 Encounter for screening for depression: Secondary | ICD-10-CM | POA: Diagnosis not present

## 2023-10-03 DIAGNOSIS — E782 Mixed hyperlipidemia: Secondary | ICD-10-CM | POA: Diagnosis not present

## 2023-10-08 DIAGNOSIS — Z6841 Body Mass Index (BMI) 40.0 and over, adult: Secondary | ICD-10-CM | POA: Diagnosis not present

## 2023-10-08 DIAGNOSIS — E782 Mixed hyperlipidemia: Secondary | ICD-10-CM | POA: Diagnosis not present

## 2023-10-08 DIAGNOSIS — I10 Essential (primary) hypertension: Secondary | ICD-10-CM | POA: Diagnosis not present

## 2023-10-08 DIAGNOSIS — I890 Lymphedema, not elsewhere classified: Secondary | ICD-10-CM | POA: Diagnosis not present

## 2023-10-08 DIAGNOSIS — G4700 Insomnia, unspecified: Secondary | ICD-10-CM | POA: Diagnosis not present

## 2023-10-08 DIAGNOSIS — E113293 Type 2 diabetes mellitus with mild nonproliferative diabetic retinopathy without macular edema, bilateral: Secondary | ICD-10-CM | POA: Diagnosis not present

## 2023-10-08 DIAGNOSIS — I2584 Coronary atherosclerosis due to calcified coronary lesion: Secondary | ICD-10-CM | POA: Diagnosis not present

## 2023-10-08 DIAGNOSIS — K869 Disease of pancreas, unspecified: Secondary | ICD-10-CM | POA: Diagnosis not present

## 2023-10-12 ENCOUNTER — Encounter (HOSPITAL_COMMUNITY)
Admission: RE | Admit: 2023-10-12 | Discharge: 2023-10-12 | Disposition: A | Discharge status: Home / Self Care | Payer: Medicare Other | Source: Ambulatory Visit | Attending: Gastroenterology | Admitting: Gastroenterology

## 2023-10-12 DIAGNOSIS — K869 Disease of pancreas, unspecified: Secondary | ICD-10-CM | POA: Insufficient documentation

## 2023-10-12 DIAGNOSIS — K8689 Other specified diseases of pancreas: Secondary | ICD-10-CM | POA: Diagnosis not present

## 2023-10-12 MED ORDER — COPPER CU 64 DOTATATE 1 MCI/ML IV SOLN
4.0000 | Freq: Once | INTRAVENOUS | Status: AC
  Administered 2023-10-12: 4.14 via INTRAVENOUS

## 2023-10-29 DIAGNOSIS — J301 Allergic rhinitis due to pollen: Secondary | ICD-10-CM | POA: Diagnosis not present

## 2023-10-29 DIAGNOSIS — J3089 Other allergic rhinitis: Secondary | ICD-10-CM | POA: Diagnosis not present

## 2023-10-29 DIAGNOSIS — T783XXD Angioneurotic edema, subsequent encounter: Secondary | ICD-10-CM | POA: Diagnosis not present

## 2023-10-29 DIAGNOSIS — L501 Idiopathic urticaria: Secondary | ICD-10-CM | POA: Diagnosis not present

## 2023-10-31 DIAGNOSIS — C7A8 Other malignant neuroendocrine tumors: Secondary | ICD-10-CM | POA: Diagnosis not present

## 2023-11-01 DIAGNOSIS — D3A8 Other benign neuroendocrine tumors: Secondary | ICD-10-CM | POA: Diagnosis not present

## 2023-11-01 DIAGNOSIS — R978 Other abnormal tumor markers: Secondary | ICD-10-CM | POA: Diagnosis not present

## 2023-11-01 DIAGNOSIS — K862 Cyst of pancreas: Secondary | ICD-10-CM | POA: Diagnosis not present

## 2023-11-01 DIAGNOSIS — C7A8 Other malignant neuroendocrine tumors: Secondary | ICD-10-CM | POA: Diagnosis not present

## 2023-11-08 DIAGNOSIS — R978 Other abnormal tumor markers: Secondary | ICD-10-CM | POA: Diagnosis not present

## 2023-11-08 DIAGNOSIS — C7A8 Other malignant neuroendocrine tumors: Secondary | ICD-10-CM | POA: Diagnosis not present

## 2023-11-08 DIAGNOSIS — K862 Cyst of pancreas: Secondary | ICD-10-CM | POA: Diagnosis not present

## 2023-11-12 DIAGNOSIS — C7A8 Other malignant neuroendocrine tumors: Secondary | ICD-10-CM | POA: Diagnosis not present

## 2023-11-12 DIAGNOSIS — K862 Cyst of pancreas: Secondary | ICD-10-CM | POA: Diagnosis not present

## 2023-11-20 DIAGNOSIS — C7A8 Other malignant neuroendocrine tumors: Secondary | ICD-10-CM | POA: Diagnosis not present

## 2024-01-02 ENCOUNTER — Other Ambulatory Visit (HOSPITAL_BASED_OUTPATIENT_CLINIC_OR_DEPARTMENT_OTHER): Payer: Self-pay | Admitting: Obstetrics & Gynecology

## 2024-01-02 DIAGNOSIS — F5101 Primary insomnia: Secondary | ICD-10-CM

## 2024-01-05 DIAGNOSIS — U071 COVID-19: Secondary | ICD-10-CM | POA: Diagnosis not present

## 2024-01-28 DIAGNOSIS — L720 Epidermal cyst: Secondary | ICD-10-CM | POA: Diagnosis not present

## 2024-01-28 DIAGNOSIS — L570 Actinic keratosis: Secondary | ICD-10-CM | POA: Diagnosis not present

## 2024-01-28 DIAGNOSIS — L821 Other seborrheic keratosis: Secondary | ICD-10-CM | POA: Diagnosis not present

## 2024-02-03 NOTE — Progress Notes (Deleted)
 Office Visit    Patient Name: Alexandra Williams Date of Encounter: 02/03/2024  PCP:  Gweneth Dimitri, MD   [facility]  Cardiologist:  Lance Muss, MD  Advanced Practice Provider:  No care team member to display Electrophysiologist:  None   HPI    Alexandra Williams is a 83 y.o. female with past medical history significant for difficult to control hypertension hyperlipidemia presents today for follow-up visit.   Patient has a history of chronic edema in her lower extremity thought to be lymphedema.  Some days are worse than others.  She uses an inflation device to help with compression.  She also has compression stockings and tries to elevate her legs as well.  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  2018, she had an abdominal CT.  Cardiomegaly was noted.  There were coronary artery calcifications as well.  In the past it was noted " coronary calcification, no symptoms of angina."  Encouraged to be more active.  Reviewed risk modifications which included healthy diet, more exercise, watching blood pressure and cholesterol.  LDL 60 21 May 2018.  She has had atrial enlargement and RV enlargement noted on echocardiogram 2018.  No signs of left-sided congestive heart failure.  No further testing needed at that time.  She was told to let us know if she has worsening symptoms.  2018 echocardiogram showed normal LVEF 55 to 60%, mild MR, grade 2 DD, mild LAE, mildly dilated RV, and trivial TR.  Dilated atria and mildly dilated RV were noted.  Nominal CT scan was done for abdominal pain.  Her son is a Land from [place] and was helped to treat her as well.  She was feeling better at that time.  She did endorse a dry cough and thought it was allergies.  Lisinopril was changed years ago.  08/2020, she moved to [place] with her daughter.  She sold her house.  She does have access to exercise classes.  Still endorse lymphedema.  Some days are  better than others.  She continue to elevate legs and use compression.  Blood pressure readings 140s to 150s systolic.  She was last seen 01/20/2022 and lipedema was her biggest issue at that time.  A referral to [facility] was given.  Patient saw me back on 02/08/2023, she states that she sees [facility] rehab for lymphedema.  She did see Dr. Sherral Hammers and he could not help her.  She saw [facility] initially and went through several rounds of therapy.  Then her husband passed away and she stopped doing the lymphatic massage but still bandaged her legs.  She then got the lymphedema pump.,  And was no longer excepting lymphedema patients without cancer not so she ended up at [facility].  She does share that her trips to [facility] will end soon and she will be on her own.  She can always schedule an as needed session with them.  At home, she has braces, compression socks, and a pump.  Reports no shortness of breath nor dyspnea on exertion. Reports no chest pain, pressure, or tightness. No orthopnea, PND. Reports no palpitations.   Today, she***   Past Medical History    Past Medical History:  Diagnosis Date   Abnormal Pap smear 4/82   colpo/ecc negative   Arthritis    knee   Cervical polyp 1/99   Colon polyps    Diabetes mellitus without complication (HCC)  pre-diabetes   Fibrocystic breast    Hives    undetermined-on 7 antihistamines   Hyperlipidemia    Hypertension    Postmenopausal bleeding 3/99   on HRT/endometrial biopsy-focal simple hyperplasia   Uterine fibroid    Vasculitis (HCC)    bilat legs   Vitamin D deficiency    Past Surgical History:  Procedure Laterality Date   BREAST BIOPSY Left    ESOPHAGOGASTRODUODENOSCOPY (EGD) WITH PROPOFOL N/A 09/19/2023   Procedure: ESOPHAGOGASTRODUODENOSCOPY (EGD) WITH PROPOFOL;  Surgeon: Willis Modena, MD;  Location: [facility];  Service: Gastroenterology;  Laterality: N/A;   FINE NEEDLE ASPIRATION N/A 09/19/2023    Procedure: FINE NEEDLE ASPIRATION (FNA) LINEAR;  Surgeon: Willis Modena, MD;  Location: [facility];  Service: Gastroenterology;  Laterality: N/A;   INCISION AND DRAINAGE     Bartholin cyst   TONSILLECTOMY AND ADENOIDECTOMY     as a child   UPPER ESOPHAGEAL ENDOSCOPIC ULTRASOUND (EUS) Bilateral 09/19/2023   Procedure: UPPER ESOPHAGEAL ENDOSCOPIC ULTRASOUND (EUS);  Surgeon: Willis Modena, MD;  Location: [facility];  Service: Gastroenterology;  Laterality: Bilateral;    Allergies  Allergies  Allergen Reactions   Codeine Hives   Peanuts [Peanut Oil] Hives   Shellfish Allergy Hives   Sulfites Hives    preservatives preservatives   Tessalon [Benzonatate] Hives and Itching   Amoxicillin-Pot Clavulanate Hives   Hylan G-F 20    Tramadol Other (See Comments)    Does not tolerate well feels funny    Elemental Sulfur Itching     EKGs/Labs/Other Studies Reviewed:   The following studies were reviewed today:  Echo 08/13/17 Study Conclusions   - Left ventricle: The cavity size was normal. Systolic function was    normal. The estimated ejection fraction was in the range of 55%    to 60%. Wall motion was normal; there were no regional wall    motion abnormalities. Features are consistent with a pseudonormal    left ventricular filling pattern, with concomitant abnormal    relaxation and increased filling pressure (grade 2 diastolic    dysfunction).  - Mitral valve: There was mild regurgitation.  - Left atrium: The atrium was mildly dilated.  - Right ventricle: The cavity size was mildly dilated. Wall    thickness was normal.  - Tricuspid valve: There was trivial regurgitation.   Impressions:   - Normal LVF with EF 55-60%, mild MR, grade 2DD, mild LAE, mildly    dilated RV and trivial TR.   EKG:  EKG is  ordered today.  The ekg ordered today demonstrates normal sinus rhythm, rate 61 bpm  Recent Labs: 08/01/2023: Creatinine, Ser 0.80  Recent Lipid Panel No results found  for: "CHOL", "TRIG", "HDL", "CHOLHDL", "VLDL", "LDLCALC", "LDLDIRECT"  Home Medications   No outpatient medications have been marked as taking for the 02/04/24 encounter (Appointment) with Sharlene Dory, PA-C.     Review of Systems      All other systems reviewed and are otherwise negative except as noted above.  Physical Exam    VS:  LMP 12/18/2000  , BMI There is no height or weight on file to calculate BMI.  Wt Readings from Last 3 Encounters:  09/19/23 222 lb 0.1 oz (100.7 kg)  07/09/23 224 lb 3.2 oz (101.7 kg)  02/08/23 225 lb 3.2 oz (102.2 kg)     GEN: Well nourished, well developed, in no acute distress. HEENT: normal. Neck: Supple, no JVD, carotid bruits, or masses. Cardiac: RRR, no murmurs, rubs, or gallops. No  clubbing, cyanosis, edema.  Radials/PT 2+ and equal bilaterally.  Respiratory:  Respirations regular and unlabored, clear to auscultation bilaterally. GI: Soft, nontender, nondistended. MS: No deformity or atrophy. Skin: Warm and dry, no rash. Neuro:  Strength and sensation are intact. Psych: Normal affect.  Assessment & Plan    Hypertension -she takes her BP 130-145 mmHg systolic  -continue current medication: Amlodipine 10 mg daily, aspirin 81 mg daily, Lipitor 40 mg daily, carvedilol 25 mg twice daily, Benicar 40 mg daily  Hyperlipidemia  -LDL 81 -triglycerides 133 -continue Lipitor -repeat lipid panel fall 2024  Lymphedema  -[facility] therapy  -continue braces, pump, compression -discussed getting back on the stationary bike -low sodium diet  Mild Mitral regurgitation  -last echo 2018, stable     Disposition: Follow up 1 year with Lance Muss, MD or APP.  Signed, Sharlene Dory, PA-C 02/03/2024, 9:23 PM [facility]

## 2024-02-04 ENCOUNTER — Ambulatory Visit: Payer: Medicare Other | Admitting: Physician Assistant

## 2024-02-04 DIAGNOSIS — R011 Cardiac murmur, unspecified: Secondary | ICD-10-CM

## 2024-02-04 DIAGNOSIS — I10 Essential (primary) hypertension: Secondary | ICD-10-CM

## 2024-02-04 DIAGNOSIS — I340 Nonrheumatic mitral (valve) insufficiency: Secondary | ICD-10-CM

## 2024-02-04 DIAGNOSIS — I890 Lymphedema, not elsewhere classified: Secondary | ICD-10-CM

## 2024-02-04 DIAGNOSIS — E782 Mixed hyperlipidemia: Secondary | ICD-10-CM

## 2024-03-02 NOTE — Progress Notes (Unsigned)
 Office Visit    Patient Name: Alexandra Williams Date of Encounter: 03/03/2024  PCP:  Gweneth Dimitri, MD   [facility]  Cardiologist:  Lance Muss, MD  Advanced Practice Provider:  No care team member to display Electrophysiologist:  None   HPI    Alexandra Williams is a 83 y.o. female with past medical history significant for difficult to control hypertension hyperlipidemia presents today for follow-up visit.   Patient has a history of chronic edema in her lower extremity thought to be lymphedema.  Some days are worse than others.  She uses an inflation device to help with compression.  She also has compression stockings and tries to elevate her legs as well.  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  2018, she had an abdominal CT.  Cardiomegaly was noted.  There were coronary artery calcifications as well.  In the past it was noted " coronary calcification, no symptoms of angina."  Encouraged to be more active.  Reviewed risk modifications which included healthy diet, more exercise, watching blood pressure and cholesterol.  LDL 60 21 May 2018.  She has had atrial enlargement and RV enlargement noted on echocardiogram 2018.  No signs of left-sided congestive heart failure.  No further testing needed at that time.  She was told to let us know if she has worsening symptoms.  2018 echocardiogram showed normal LVEF 55 to 60%, mild MR, grade 2 DD, mild LAE, mildly dilated RV, and trivial TR.  Dilated atria and mildly dilated RV were noted.  Nominal CT scan was done for abdominal pain.  Her son is a Land from [place] and was helped to treat her as well.  She was feeling better at that time.  She did endorse a dry cough and thought it was allergies.  Lisinopril was changed years ago.  08/2020, she moved to [place] with her daughter.  She sold her house.  She does have access to exercise classes.  Still endorse lymphedema.  Some days are  better than others.  She continue to elevate legs and use compression.  Blood pressure readings 140s to 150s systolic.  She was last seen 01/20/2022 and lipedema was her biggest issue at that time.  A referral to [facility] was given.  Patient saw me back on 02/08/2023, she states that she sees [facility] rehab for lymphedema.  She did see Dr. Sherral Hammers and he could not help her.  She saw [facility] initially and went through several rounds of therapy.  Then her husband passed away and she stopped doing the lymphatic massage but still bandaged her legs.  She then got the lymphedema pump.,  And was no longer excepting lymphedema patients without cancer not so she ended up at [facility].  She does share that her trips to [facility] will end soon and she will be on her own.  She can always schedule an as needed session with them.  At home, she has braces, compression socks, and a pump.  Reports no shortness of breath nor dyspnea on exertion. Reports no chest pain, pressure, or tightness. No orthopnea, PND. Reports no palpitations.   Today, she presents, with a history of heart disease, lymphedema, and allergies,  for a routine follow-up. She reports having recovered from COVID-19, but experienced significant fatigue in the aftermath of the illness. She also reports shortness of breath with exertion, not constant but dependent on the distance walked. The patient  has a chronic cough, which she attributes to allergies. She has been managing her lymphedema with a home pump and compression socks, but has not been able to access lymphedema massage therapy due to insurance limitations. The patient also has a history of severe allergic reactions, including hives, and carries an EpiPen for emergencies.  Reports no shortness of breath nor dyspnea on exertion. Reports no chest pain, pressure, or tightness. No  orthopnea, PND. Reports no palpitations.   Discussed the use of AI scribe software for clinical note  transcription with the patient, who gave verbal consent to proceed.  Past Medical History    Past Medical History:  Diagnosis Date   Abnormal Pap smear 4/82   colpo/ecc negative   Arthritis    knee   Cervical polyp 1/99   Colon polyps    Diabetes mellitus without complication (HCC)    pre-diabetes   Fibrocystic breast    Hives    undetermined-on 7 antihistamines   Hyperlipidemia    Hypertension    Postmenopausal bleeding 3/99   on HRT/endometrial biopsy-focal simple hyperplasia   Uterine fibroid    Vasculitis (HCC)    bilat legs   Vitamin D deficiency    Past Surgical History:  Procedure Laterality Date   BREAST BIOPSY Left    ESOPHAGOGASTRODUODENOSCOPY (EGD) WITH PROPOFOL N/A 09/19/2023   Procedure: ESOPHAGOGASTRODUODENOSCOPY (EGD) WITH PROPOFOL;  Surgeon: Willis Modena, MD;  Location: [facility];  Service: Gastroenterology;  Laterality: N/A;   FINE NEEDLE ASPIRATION N/A 09/19/2023   Procedure: FINE NEEDLE ASPIRATION (FNA) LINEAR;  Surgeon: Willis Modena, MD;  Location: [facility];  Service: Gastroenterology;  Laterality: N/A;   INCISION AND DRAINAGE     Bartholin cyst   TONSILLECTOMY AND ADENOIDECTOMY     as a child   UPPER ESOPHAGEAL ENDOSCOPIC ULTRASOUND (EUS) Bilateral 09/19/2023   Procedure: UPPER ESOPHAGEAL ENDOSCOPIC ULTRASOUND (EUS);  Surgeon: Willis Modena, MD;  Location: [facility];  Service: Gastroenterology;  Laterality: Bilateral;    Allergies  Allergies  Allergen Reactions   Codeine Hives   Peanuts [Peanut Oil] Hives   Shellfish Allergy Hives   Sulfites Hives    preservatives preservatives   Tessalon [Benzonatate] Hives and Itching   Amoxicillin-Pot Clavulanate Hives   Hylan G-F 20    Tramadol Other (See Comments)    Does not tolerate well feels funny    Elemental Sulfur Itching     EKGs/Labs/Other Studies Reviewed:   The following studies were reviewed today:  Echo 08/13/17 Study Conclusions   - Left ventricle: The cavity  size was normal. Systolic function was    normal. The estimated ejection fraction was in the range of 55%    to 60%. Wall motion was normal; there were no regional wall    motion abnormalities. Features are consistent with a pseudonormal    left ventricular filling pattern, with concomitant abnormal    relaxation and increased filling pressure (grade 2 diastolic    dysfunction).  - Mitral valve: There was mild regurgitation.  - Left atrium: The atrium was mildly dilated.  - Right ventricle: The cavity size was mildly dilated. Wall    thickness was normal.  - Tricuspid valve: There was trivial regurgitation.   Impressions:   - Normal LVF with EF 55-60%, mild MR, grade 2DD, mild LAE, mildly    dilated RV and trivial TR.   EKG:  EKG is  ordered today.  The ekg ordered today demonstrates normal sinus rhythm, rate 61 bpm  Recent Labs: 08/01/2023: Creatinine,  Ser 0.80  Recent Lipid Panel No results found for: "CHOL", "TRIG", "HDL", "CHOLHDL", "VLDL", "LDLCALC", "LDLDIRECT"  Home Medications   Current Meds  Medication Sig   acetaminophen (TYLENOL) 650 MG CR tablet Take 650 mg by mouth every 8 (eight) hours as needed for pain or fever.   amLODipine (NORVASC) 10 MG tablet Take 1 tablet (10 mg total) by mouth daily.   aspirin 81 MG tablet Take 81 mg by mouth daily.   atorvastatin (LIPITOR) 40 MG tablet TAKE ONE TABLET BY MOUTH DAILY   carvedilol (COREG) 25 MG tablet Take 1 tablet (25 mg total) by mouth 2 (two) times daily.   cyanocobalamin 100 MCG tablet Take 1 tablet by mouth daily.   EPIPEN 2-PAK 0.3 MG/0.3ML SOAJ Inject 0.3 mg into the muscle as needed (HIVES).   glipiZIDE (GLUCOTROL XL) 5 MG 24 hr tablet Take 5 mg by mouth daily with breakfast.   loratadine (CLARITIN) 10 MG tablet Take 1 tablet by mouth every evening.   Multiple Vitamin (MULTIVITAMIN) tablet Take 1 tablet by mouth daily.   olmesartan (BENICAR) 40 MG tablet Take 40 mg by mouth daily.   ONETOUCH DELICA LANCETS 33G MISC     ONETOUCH VERIO test strip    zolpidem (AMBIEN) 5 MG tablet TAKE ONE TABLET BY MOUTH AT BEDTIME     Review of Systems      All other systems reviewed and are otherwise negative except as noted above.  Physical Exam    VS:  BP (!) 140/78   Pulse (!) 57   Ht 4\' 10"  (1.473 m)   Wt 236 lb (107 kg)   LMP 12/18/2000   SpO2 95%   BMI 49.32 kg/m  , BMI Body mass index is 49.32 kg/m.  Wt Readings from Last 3 Encounters:  03/03/24 236 lb (107 kg)  09/19/23 222 lb 0.1 oz (100.7 kg)  07/09/23 224 lb 3.2 oz (101.7 kg)     GEN: Well nourished, well developed, in no acute distress. HEENT: normal. Neck: Supple, no JVD, carotid bruits, or masses. Cardiac: RRR, + systolic murmurs, rubs, or gallops. No clubbing, cyanosis, edema.  Radials/PT 2+ and equal bilaterally.  Respiratory:  Respirations regular and unlabored, clear to auscultation bilaterally. GI: Soft, nontender, nondistended. MS: No deformity or atrophy. Skin: Warm and dry, no rash. Neuro:  Strength and sensation are intact. Psych: Normal affect.  Assessment & Plan   Shortness of breath Intermittent exertional dyspnea likely due to deconditioning or mild exertional intolerance. This is a chronic condition  Mild mitral regurgitation Mild mitral regurgitation with no new symptoms since last echocardiogram in 2018. - Order echocardiogram to evaluate mitral regurgitation status.  Chronic cough Chronic dry cough possibly related to environmental allergies or irritants. - Discuss with primary care provider for further evaluation and management of potential environmental allergies.  Chronic hives and allergies Chronic urticaria managed with Claritin and EpiPen. Recent episode post-mahi mahi consumption. - Continue daily Claritin. - Carry EpiPen for emergency use. - Avoid known allergens and monitor for any new triggers.  Lymphedema Lymphedema managed with home pump and compression garments. Insurance coverage limits access  to lymphatic massage therapy. - Investigate local options for lymphatic massage therapy that may be covered by insurance. - Consider referral to [facility] outpatient rehabilitation at [facility] region or other local facilities.  Hyperlipidemia LDL cholesterol well-controlled at 70 with current Lipitor regimen. - Continue current Lipitor regimen. - Repeat lipid panel in mid-spring as per routine schedule.  Follow-up Well-managed cardiac status  with no new concerning symptoms. - Schedule annual follow-up visit. - Coordinate with nurse to provide information on lymphatic massage therapy options.   Signed, Sharlene Dory, PA-C 03/03/2024, 10:45 AM  [facility]

## 2024-03-03 ENCOUNTER — Encounter: Payer: Self-pay | Admitting: Physician Assistant

## 2024-03-03 ENCOUNTER — Other Ambulatory Visit: Payer: Self-pay | Admitting: Physician Assistant

## 2024-03-03 ENCOUNTER — Ambulatory Visit: Payer: Medicare Other | Attending: Physician Assistant | Admitting: Physician Assistant

## 2024-03-03 VITALS — BP 140/78 | HR 57 | Ht <= 58 in | Wt 236.0 lb

## 2024-03-03 DIAGNOSIS — I340 Nonrheumatic mitral (valve) insufficiency: Secondary | ICD-10-CM | POA: Diagnosis not present

## 2024-03-03 DIAGNOSIS — I890 Lymphedema, not elsewhere classified: Secondary | ICD-10-CM | POA: Diagnosis not present

## 2024-03-03 DIAGNOSIS — E782 Mixed hyperlipidemia: Secondary | ICD-10-CM | POA: Insufficient documentation

## 2024-03-03 DIAGNOSIS — I10 Essential (primary) hypertension: Secondary | ICD-10-CM | POA: Diagnosis not present

## 2024-03-03 NOTE — Patient Instructions (Signed)
 Medication Instructions:  Your physician recommends that you continue on your current medications as directed. Please refer to the Current Medication list given to you today.  *If you need a refill on your cardiac medications before your next appointment, please call your pharmacy*   Lab Work: NONE If you have labs (blood work) drawn today and your tests are completely normal, you will receive your results only by: MyChart Message (if you have MyChart) OR A paper copy in the mail If you have any lab test that is abnormal or we need to change your treatment, we will call you to review the results.   Testing/Procedures: Echocardiogram Your physician has requested that you have an echocardiogram. Echocardiography is a painless test that uses sound waves to create images of your heart. It provides your doctor with information about the size and shape of your heart and how well your heart's chambers and valves are working. This procedure takes approximately one hour. There are no restrictions for this procedure. Please do NOT wear cologne, perfume, aftershave, or lotions (deodorant is allowed). Please arrive 15 minutes prior to your appointment time.  Please note: We ask at that you not bring children with you during ultrasound (echo/ vascular) testing. Due to room size and safety concerns, children are not allowed in the ultrasound rooms during exams. Our front office staff cannot provide observation of children in our lobby area while testing is being conducted. An adult accompanying a patient to their appointment will only be allowed in the ultrasound room at the discretion of the ultrasound technician under special circumstances. We apologize for any inconvenience.    Follow-Up: At [facility], you and your health needs are our priority.  As part of our continuing mission to provide you with exceptional heart care, we have created designated Provider Care Teams.  These Care Teams  include your primary Cardiologist (physician) and Advanced Practice Providers (APPs -  Physician Assistants and Nurse Practitioners) who all work together to provide you with the care you need, when you need it.  We recommend signing up for the patient portal called "MyChart".  Sign up information is provided on this After Visit Summary.  MyChart is used to connect with patients for Virtual Visits (Telemedicine).  Patients are able to view lab/test results, encounter notes, upcoming appointments, etc.  Non-urgent messages can be sent to your provider as well.   To learn more about what you can do with MyChart, go to [email or web address].    Your next appointment:   1 year(s)  Provider:   Jari Favre, PA-C  Other Instructions We have referred you to PT at [facility] for lymphedema

## 2024-03-28 ENCOUNTER — Ambulatory Visit (HOSPITAL_COMMUNITY): Attending: Cardiology

## 2024-03-28 ENCOUNTER — Encounter (HOSPITAL_BASED_OUTPATIENT_CLINIC_OR_DEPARTMENT_OTHER): Payer: Self-pay

## 2024-03-28 DIAGNOSIS — I340 Nonrheumatic mitral (valve) insufficiency: Secondary | ICD-10-CM | POA: Diagnosis not present

## 2024-03-28 LAB — ECHOCARDIOGRAM COMPLETE
Area-P 1/2: 3.7 cm2
S' Lateral: 2.6 cm

## 2024-04-03 DIAGNOSIS — E113293 Type 2 diabetes mellitus with mild nonproliferative diabetic retinopathy without macular edema, bilateral: Secondary | ICD-10-CM | POA: Diagnosis not present

## 2024-04-09 DIAGNOSIS — Z6841 Body Mass Index (BMI) 40.0 and over, adult: Secondary | ICD-10-CM | POA: Diagnosis not present

## 2024-04-09 DIAGNOSIS — E782 Mixed hyperlipidemia: Secondary | ICD-10-CM | POA: Diagnosis not present

## 2024-04-09 DIAGNOSIS — I2584 Coronary atherosclerosis due to calcified coronary lesion: Secondary | ICD-10-CM | POA: Diagnosis not present

## 2024-04-09 DIAGNOSIS — C7A8 Other malignant neuroendocrine tumors: Secondary | ICD-10-CM | POA: Diagnosis not present

## 2024-04-09 DIAGNOSIS — G4700 Insomnia, unspecified: Secondary | ICD-10-CM | POA: Diagnosis not present

## 2024-04-09 DIAGNOSIS — E113293 Type 2 diabetes mellitus with mild nonproliferative diabetic retinopathy without macular edema, bilateral: Secondary | ICD-10-CM | POA: Diagnosis not present

## 2024-04-09 DIAGNOSIS — I890 Lymphedema, not elsewhere classified: Secondary | ICD-10-CM | POA: Diagnosis not present

## 2024-04-09 DIAGNOSIS — I10 Essential (primary) hypertension: Secondary | ICD-10-CM | POA: Diagnosis not present

## 2024-04-14 ENCOUNTER — Other Ambulatory Visit: Payer: Self-pay | Admitting: Physician Assistant

## 2024-04-16 DIAGNOSIS — E113293 Type 2 diabetes mellitus with mild nonproliferative diabetic retinopathy without macular edema, bilateral: Secondary | ICD-10-CM | POA: Diagnosis not present

## 2024-04-16 DIAGNOSIS — E782 Mixed hyperlipidemia: Secondary | ICD-10-CM | POA: Diagnosis not present

## 2024-04-16 DIAGNOSIS — I10 Essential (primary) hypertension: Secondary | ICD-10-CM | POA: Diagnosis not present

## 2024-04-17 ENCOUNTER — Encounter: Payer: Self-pay | Admitting: Family Medicine

## 2024-04-17 DIAGNOSIS — Z0000 Encounter for general adult medical examination without abnormal findings: Secondary | ICD-10-CM

## 2024-04-21 ENCOUNTER — Other Ambulatory Visit: Payer: Self-pay | Admitting: Obstetrics & Gynecology

## 2024-04-21 DIAGNOSIS — Z0000 Encounter for general adult medical examination without abnormal findings: Secondary | ICD-10-CM

## 2024-04-29 ENCOUNTER — Encounter

## 2024-04-29 DIAGNOSIS — Z1231 Encounter for screening mammogram for malignant neoplasm of breast: Secondary | ICD-10-CM

## 2024-05-08 DIAGNOSIS — Z6841 Body Mass Index (BMI) 40.0 and over, adult: Secondary | ICD-10-CM | POA: Diagnosis not present

## 2024-05-08 DIAGNOSIS — M549 Dorsalgia, unspecified: Secondary | ICD-10-CM | POA: Diagnosis not present

## 2024-05-17 DIAGNOSIS — E113293 Type 2 diabetes mellitus with mild nonproliferative diabetic retinopathy without macular edema, bilateral: Secondary | ICD-10-CM | POA: Diagnosis not present

## 2024-05-17 DIAGNOSIS — E782 Mixed hyperlipidemia: Secondary | ICD-10-CM | POA: Diagnosis not present

## 2024-05-17 DIAGNOSIS — I10 Essential (primary) hypertension: Secondary | ICD-10-CM | POA: Diagnosis not present

## 2024-05-20 DIAGNOSIS — J3089 Other allergic rhinitis: Secondary | ICD-10-CM | POA: Diagnosis not present

## 2024-05-20 DIAGNOSIS — J301 Allergic rhinitis due to pollen: Secondary | ICD-10-CM | POA: Diagnosis not present

## 2024-05-20 DIAGNOSIS — T783XXD Angioneurotic edema, subsequent encounter: Secondary | ICD-10-CM | POA: Diagnosis not present

## 2024-05-20 DIAGNOSIS — L501 Idiopathic urticaria: Secondary | ICD-10-CM | POA: Diagnosis not present

## 2024-05-27 DIAGNOSIS — K7689 Other specified diseases of liver: Secondary | ICD-10-CM | POA: Diagnosis not present

## 2024-05-27 DIAGNOSIS — K862 Cyst of pancreas: Secondary | ICD-10-CM | POA: Diagnosis not present

## 2024-05-27 DIAGNOSIS — C7A8 Other malignant neuroendocrine tumors: Secondary | ICD-10-CM | POA: Diagnosis not present

## 2024-05-27 DIAGNOSIS — L508 Other urticaria: Secondary | ICD-10-CM | POA: Diagnosis not present

## 2024-05-27 DIAGNOSIS — R911 Solitary pulmonary nodule: Secondary | ICD-10-CM | POA: Diagnosis not present

## 2024-05-27 DIAGNOSIS — L509 Urticaria, unspecified: Secondary | ICD-10-CM | POA: Diagnosis not present

## 2024-05-28 DIAGNOSIS — J3089 Other allergic rhinitis: Secondary | ICD-10-CM | POA: Diagnosis not present

## 2024-05-28 DIAGNOSIS — J301 Allergic rhinitis due to pollen: Secondary | ICD-10-CM | POA: Diagnosis not present

## 2024-05-28 DIAGNOSIS — T783XXD Angioneurotic edema, subsequent encounter: Secondary | ICD-10-CM | POA: Diagnosis not present

## 2024-05-28 DIAGNOSIS — L501 Idiopathic urticaria: Secondary | ICD-10-CM | POA: Diagnosis not present

## 2024-05-29 DIAGNOSIS — R911 Solitary pulmonary nodule: Secondary | ICD-10-CM | POA: Diagnosis not present

## 2024-05-29 DIAGNOSIS — E042 Nontoxic multinodular goiter: Secondary | ICD-10-CM | POA: Diagnosis not present

## 2024-05-29 DIAGNOSIS — C7A8 Other malignant neuroendocrine tumors: Secondary | ICD-10-CM | POA: Diagnosis not present

## 2024-05-29 DIAGNOSIS — R918 Other nonspecific abnormal finding of lung field: Secondary | ICD-10-CM | POA: Diagnosis not present

## 2024-06-02 ENCOUNTER — Ambulatory Visit

## 2024-06-13 DIAGNOSIS — I890 Lymphedema, not elsewhere classified: Secondary | ICD-10-CM | POA: Diagnosis not present

## 2024-06-13 DIAGNOSIS — E119 Type 2 diabetes mellitus without complications: Secondary | ICD-10-CM | POA: Diagnosis not present

## 2024-06-13 DIAGNOSIS — I10 Essential (primary) hypertension: Secondary | ICD-10-CM | POA: Diagnosis not present

## 2024-06-16 DIAGNOSIS — E782 Mixed hyperlipidemia: Secondary | ICD-10-CM | POA: Diagnosis not present

## 2024-06-16 DIAGNOSIS — E113293 Type 2 diabetes mellitus with mild nonproliferative diabetic retinopathy without macular edema, bilateral: Secondary | ICD-10-CM | POA: Diagnosis not present

## 2024-06-16 DIAGNOSIS — I10 Essential (primary) hypertension: Secondary | ICD-10-CM | POA: Diagnosis not present

## 2024-06-18 ENCOUNTER — Ambulatory Visit
Admission: RE | Admit: 2024-06-18 | Discharge: 2024-06-18 | Disposition: A | Discharge status: Home / Self Care | Source: Ambulatory Visit | Attending: Obstetrics & Gynecology | Admitting: Obstetrics & Gynecology

## 2024-06-18 DIAGNOSIS — Z1231 Encounter for screening mammogram for malignant neoplasm of breast: Secondary | ICD-10-CM | POA: Diagnosis not present

## 2024-06-18 DIAGNOSIS — Z0000 Encounter for general adult medical examination without abnormal findings: Secondary | ICD-10-CM

## 2024-06-24 DIAGNOSIS — T783XXD Angioneurotic edema, subsequent encounter: Secondary | ICD-10-CM | POA: Diagnosis not present

## 2024-06-24 DIAGNOSIS — J301 Allergic rhinitis due to pollen: Secondary | ICD-10-CM | POA: Diagnosis not present

## 2024-06-24 DIAGNOSIS — L501 Idiopathic urticaria: Secondary | ICD-10-CM | POA: Diagnosis not present

## 2024-06-24 DIAGNOSIS — J3089 Other allergic rhinitis: Secondary | ICD-10-CM | POA: Diagnosis not present

## 2024-06-30 ENCOUNTER — Other Ambulatory Visit (HOSPITAL_BASED_OUTPATIENT_CLINIC_OR_DEPARTMENT_OTHER): Payer: Self-pay | Admitting: Obstetrics & Gynecology

## 2024-06-30 DIAGNOSIS — F5101 Primary insomnia: Secondary | ICD-10-CM

## 2024-07-04 DIAGNOSIS — L501 Idiopathic urticaria: Secondary | ICD-10-CM | POA: Diagnosis not present

## 2024-07-08 DIAGNOSIS — J301 Allergic rhinitis due to pollen: Secondary | ICD-10-CM | POA: Diagnosis not present

## 2024-07-08 DIAGNOSIS — L501 Idiopathic urticaria: Secondary | ICD-10-CM | POA: Diagnosis not present

## 2024-07-08 DIAGNOSIS — R21 Rash and other nonspecific skin eruption: Secondary | ICD-10-CM | POA: Diagnosis not present

## 2024-07-08 DIAGNOSIS — J3089 Other allergic rhinitis: Secondary | ICD-10-CM | POA: Diagnosis not present

## 2024-07-10 ENCOUNTER — Ambulatory Visit (HOSPITAL_BASED_OUTPATIENT_CLINIC_OR_DEPARTMENT_OTHER): Payer: Medicare Other | Admitting: Obstetrics & Gynecology

## 2024-07-17 DIAGNOSIS — L501 Idiopathic urticaria: Secondary | ICD-10-CM | POA: Diagnosis not present

## 2024-07-17 DIAGNOSIS — E113293 Type 2 diabetes mellitus with mild nonproliferative diabetic retinopathy without macular edema, bilateral: Secondary | ICD-10-CM | POA: Diagnosis not present

## 2024-07-17 DIAGNOSIS — I10 Essential (primary) hypertension: Secondary | ICD-10-CM | POA: Diagnosis not present

## 2024-07-17 DIAGNOSIS — E782 Mixed hyperlipidemia: Secondary | ICD-10-CM | POA: Diagnosis not present

## 2024-07-18 DIAGNOSIS — H524 Presbyopia: Secondary | ICD-10-CM | POA: Diagnosis not present

## 2024-07-18 DIAGNOSIS — H26492 Other secondary cataract, left eye: Secondary | ICD-10-CM | POA: Diagnosis not present

## 2024-07-18 DIAGNOSIS — H04123 Dry eye syndrome of bilateral lacrimal glands: Secondary | ICD-10-CM | POA: Diagnosis not present

## 2024-07-18 DIAGNOSIS — Z961 Presence of intraocular lens: Secondary | ICD-10-CM | POA: Diagnosis not present

## 2024-07-18 DIAGNOSIS — E113291 Type 2 diabetes mellitus with mild nonproliferative diabetic retinopathy without macular edema, right eye: Secondary | ICD-10-CM | POA: Diagnosis not present

## 2024-07-30 DIAGNOSIS — J301 Allergic rhinitis due to pollen: Secondary | ICD-10-CM | POA: Diagnosis not present

## 2024-07-30 DIAGNOSIS — L501 Idiopathic urticaria: Secondary | ICD-10-CM | POA: Diagnosis not present

## 2024-07-30 DIAGNOSIS — J3089 Other allergic rhinitis: Secondary | ICD-10-CM | POA: Diagnosis not present

## 2024-07-30 DIAGNOSIS — R21 Rash and other nonspecific skin eruption: Secondary | ICD-10-CM | POA: Diagnosis not present

## 2024-08-05 DIAGNOSIS — I890 Lymphedema, not elsewhere classified: Secondary | ICD-10-CM | POA: Diagnosis not present

## 2024-08-06 DIAGNOSIS — L501 Idiopathic urticaria: Secondary | ICD-10-CM | POA: Diagnosis not present

## 2024-08-11 DIAGNOSIS — I890 Lymphedema, not elsewhere classified: Secondary | ICD-10-CM | POA: Diagnosis not present

## 2024-08-17 DIAGNOSIS — E113293 Type 2 diabetes mellitus with mild nonproliferative diabetic retinopathy without macular edema, bilateral: Secondary | ICD-10-CM | POA: Diagnosis not present

## 2024-08-17 DIAGNOSIS — I10 Essential (primary) hypertension: Secondary | ICD-10-CM | POA: Diagnosis not present

## 2024-08-17 DIAGNOSIS — E782 Mixed hyperlipidemia: Secondary | ICD-10-CM | POA: Diagnosis not present

## 2024-08-20 DIAGNOSIS — I890 Lymphedema, not elsewhere classified: Secondary | ICD-10-CM | POA: Diagnosis not present

## 2024-08-27 DIAGNOSIS — I890 Lymphedema, not elsewhere classified: Secondary | ICD-10-CM | POA: Diagnosis not present

## 2024-09-03 DIAGNOSIS — I890 Lymphedema, not elsewhere classified: Secondary | ICD-10-CM | POA: Diagnosis not present

## 2024-09-04 DIAGNOSIS — L501 Idiopathic urticaria: Secondary | ICD-10-CM | POA: Diagnosis not present

## 2024-09-10 DIAGNOSIS — I890 Lymphedema, not elsewhere classified: Secondary | ICD-10-CM | POA: Diagnosis not present

## 2024-09-16 DIAGNOSIS — E113293 Type 2 diabetes mellitus with mild nonproliferative diabetic retinopathy without macular edema, bilateral: Secondary | ICD-10-CM | POA: Diagnosis not present

## 2024-09-16 DIAGNOSIS — E782 Mixed hyperlipidemia: Secondary | ICD-10-CM | POA: Diagnosis not present

## 2024-09-16 DIAGNOSIS — I10 Essential (primary) hypertension: Secondary | ICD-10-CM | POA: Diagnosis not present

## 2024-09-24 DIAGNOSIS — I890 Lymphedema, not elsewhere classified: Secondary | ICD-10-CM | POA: Diagnosis not present

## 2024-10-01 DIAGNOSIS — I890 Lymphedema, not elsewhere classified: Secondary | ICD-10-CM | POA: Diagnosis not present

## 2024-10-02 DIAGNOSIS — L501 Idiopathic urticaria: Secondary | ICD-10-CM | POA: Diagnosis not present

## 2024-10-03 DIAGNOSIS — E782 Mixed hyperlipidemia: Secondary | ICD-10-CM | POA: Diagnosis not present

## 2024-10-03 DIAGNOSIS — E113293 Type 2 diabetes mellitus with mild nonproliferative diabetic retinopathy without macular edema, bilateral: Secondary | ICD-10-CM | POA: Diagnosis not present

## 2024-10-08 DIAGNOSIS — I890 Lymphedema, not elsewhere classified: Secondary | ICD-10-CM | POA: Diagnosis not present

## 2024-10-09 DIAGNOSIS — Z0000 Encounter for general adult medical examination without abnormal findings: Secondary | ICD-10-CM | POA: Diagnosis not present

## 2024-10-09 DIAGNOSIS — Z1331 Encounter for screening for depression: Secondary | ICD-10-CM | POA: Diagnosis not present

## 2024-10-14 DIAGNOSIS — E1165 Type 2 diabetes mellitus with hyperglycemia: Secondary | ICD-10-CM | POA: Diagnosis not present

## 2024-10-14 DIAGNOSIS — G4700 Insomnia, unspecified: Secondary | ICD-10-CM | POA: Diagnosis not present

## 2024-10-14 DIAGNOSIS — I2584 Coronary atherosclerosis due to calcified coronary lesion: Secondary | ICD-10-CM | POA: Diagnosis not present

## 2024-10-14 DIAGNOSIS — I10 Essential (primary) hypertension: Secondary | ICD-10-CM | POA: Diagnosis not present

## 2024-10-14 DIAGNOSIS — Z23 Encounter for immunization: Secondary | ICD-10-CM | POA: Diagnosis not present

## 2024-10-14 DIAGNOSIS — Z6841 Body Mass Index (BMI) 40.0 and over, adult: Secondary | ICD-10-CM | POA: Diagnosis not present

## 2024-10-14 DIAGNOSIS — E113293 Type 2 diabetes mellitus with mild nonproliferative diabetic retinopathy without macular edema, bilateral: Secondary | ICD-10-CM | POA: Diagnosis not present

## 2024-10-14 DIAGNOSIS — I890 Lymphedema, not elsewhere classified: Secondary | ICD-10-CM | POA: Diagnosis not present

## 2024-10-14 DIAGNOSIS — E782 Mixed hyperlipidemia: Secondary | ICD-10-CM | POA: Diagnosis not present

## 2024-10-14 DIAGNOSIS — L501 Idiopathic urticaria: Secondary | ICD-10-CM | POA: Diagnosis not present

## 2024-10-15 DIAGNOSIS — I890 Lymphedema, not elsewhere classified: Secondary | ICD-10-CM | POA: Diagnosis not present

## 2024-10-16 DIAGNOSIS — L501 Idiopathic urticaria: Secondary | ICD-10-CM | POA: Diagnosis not present

## 2024-10-17 DIAGNOSIS — I10 Essential (primary) hypertension: Secondary | ICD-10-CM | POA: Diagnosis not present

## 2024-10-17 DIAGNOSIS — E782 Mixed hyperlipidemia: Secondary | ICD-10-CM | POA: Diagnosis not present

## 2024-10-17 DIAGNOSIS — E113293 Type 2 diabetes mellitus with mild nonproliferative diabetic retinopathy without macular edema, bilateral: Secondary | ICD-10-CM | POA: Diagnosis not present

## 2024-10-22 DIAGNOSIS — R21 Rash and other nonspecific skin eruption: Secondary | ICD-10-CM | POA: Diagnosis not present

## 2024-10-22 DIAGNOSIS — J301 Allergic rhinitis due to pollen: Secondary | ICD-10-CM | POA: Diagnosis not present

## 2024-10-22 DIAGNOSIS — J3089 Other allergic rhinitis: Secondary | ICD-10-CM | POA: Diagnosis not present

## 2024-10-22 DIAGNOSIS — L501 Idiopathic urticaria: Secondary | ICD-10-CM | POA: Diagnosis not present

## 2024-10-27 DIAGNOSIS — Z6841 Body Mass Index (BMI) 40.0 and over, adult: Secondary | ICD-10-CM | POA: Diagnosis not present

## 2024-10-27 DIAGNOSIS — Z23 Encounter for immunization: Secondary | ICD-10-CM | POA: Diagnosis not present

## 2024-10-27 DIAGNOSIS — Z794 Long term (current) use of insulin: Secondary | ICD-10-CM | POA: Diagnosis not present

## 2024-10-27 DIAGNOSIS — L508 Other urticaria: Secondary | ICD-10-CM | POA: Diagnosis not present

## 2024-10-27 DIAGNOSIS — E113553 Type 2 diabetes mellitus with stable proliferative diabetic retinopathy, bilateral: Secondary | ICD-10-CM | POA: Diagnosis not present

## 2024-10-30 DIAGNOSIS — L501 Idiopathic urticaria: Secondary | ICD-10-CM | POA: Diagnosis not present

## 2024-11-05 DIAGNOSIS — I890 Lymphedema, not elsewhere classified: Secondary | ICD-10-CM | POA: Diagnosis not present

## 2024-11-12 DIAGNOSIS — I890 Lymphedema, not elsewhere classified: Secondary | ICD-10-CM | POA: Diagnosis not present

## 2024-11-16 DIAGNOSIS — E782 Mixed hyperlipidemia: Secondary | ICD-10-CM | POA: Diagnosis not present

## 2024-11-16 DIAGNOSIS — E113293 Type 2 diabetes mellitus with mild nonproliferative diabetic retinopathy without macular edema, bilateral: Secondary | ICD-10-CM | POA: Diagnosis not present

## 2024-11-16 DIAGNOSIS — I10 Essential (primary) hypertension: Secondary | ICD-10-CM | POA: Diagnosis not present

## 2024-11-27 DIAGNOSIS — L501 Idiopathic urticaria: Secondary | ICD-10-CM | POA: Diagnosis not present

## 2024-12-02 DIAGNOSIS — C7A8 Other malignant neuroendocrine tumors: Secondary | ICD-10-CM | POA: Diagnosis not present

## 2024-12-19 ENCOUNTER — Emergency Department (HOSPITAL_COMMUNITY)

## 2024-12-19 ENCOUNTER — Encounter (HOSPITAL_COMMUNITY): Payer: Self-pay | Admitting: Internal Medicine

## 2024-12-19 ENCOUNTER — Other Ambulatory Visit: Payer: Self-pay

## 2024-12-19 ENCOUNTER — Inpatient Hospital Stay (HOSPITAL_COMMUNITY)
Admission: EM | Admit: 2024-12-19 | Discharge: 2024-12-23 | DRG: 189 | Disposition: A | Discharge status: Home Health | Source: Skilled Nursing Facility | Attending: Internal Medicine | Admitting: Internal Medicine

## 2024-12-19 DIAGNOSIS — J9601 Acute respiratory failure with hypoxia: Secondary | ICD-10-CM | POA: Diagnosis present

## 2024-12-19 DIAGNOSIS — Z808 Family history of malignant neoplasm of other organs or systems: Secondary | ICD-10-CM

## 2024-12-19 DIAGNOSIS — J209 Acute bronchitis, unspecified: Secondary | ICD-10-CM | POA: Diagnosis present

## 2024-12-19 DIAGNOSIS — R0902 Hypoxemia: Secondary | ICD-10-CM | POA: Diagnosis present

## 2024-12-19 DIAGNOSIS — Z809 Family history of malignant neoplasm, unspecified: Secondary | ICD-10-CM | POA: Diagnosis not present

## 2024-12-19 DIAGNOSIS — I890 Lymphedema, not elsewhere classified: Secondary | ICD-10-CM | POA: Diagnosis present

## 2024-12-19 DIAGNOSIS — Z885 Allergy status to narcotic agent status: Secondary | ICD-10-CM | POA: Diagnosis not present

## 2024-12-19 DIAGNOSIS — E042 Nontoxic multinodular goiter: Secondary | ICD-10-CM | POA: Diagnosis present

## 2024-12-19 DIAGNOSIS — Z800 Family history of malignant neoplasm of digestive organs: Secondary | ICD-10-CM

## 2024-12-19 DIAGNOSIS — Z7982 Long term (current) use of aspirin: Secondary | ICD-10-CM

## 2024-12-19 DIAGNOSIS — Z7984 Long term (current) use of oral hypoglycemic drugs: Secondary | ICD-10-CM | POA: Diagnosis not present

## 2024-12-19 DIAGNOSIS — Z810 Family history of intellectual disabilities: Secondary | ICD-10-CM | POA: Diagnosis not present

## 2024-12-19 DIAGNOSIS — Z79899 Other long term (current) drug therapy: Secondary | ICD-10-CM | POA: Diagnosis not present

## 2024-12-19 DIAGNOSIS — Z83438 Family history of other disorder of lipoprotein metabolism and other lipidemia: Secondary | ICD-10-CM | POA: Diagnosis not present

## 2024-12-19 DIAGNOSIS — Z8419 Family history of other disorders of kidney and ureter: Secondary | ICD-10-CM | POA: Diagnosis not present

## 2024-12-19 DIAGNOSIS — Z1152 Encounter for screening for COVID-19: Secondary | ICD-10-CM | POA: Diagnosis not present

## 2024-12-19 DIAGNOSIS — Z91041 Radiographic dye allergy status: Secondary | ICD-10-CM

## 2024-12-19 DIAGNOSIS — E782 Mixed hyperlipidemia: Secondary | ICD-10-CM | POA: Diagnosis present

## 2024-12-19 DIAGNOSIS — Z860100 Personal history of colon polyps, unspecified: Secondary | ICD-10-CM

## 2024-12-19 DIAGNOSIS — Z880 Allergy status to penicillin: Secondary | ICD-10-CM

## 2024-12-19 DIAGNOSIS — E66813 Obesity, class 3: Secondary | ICD-10-CM | POA: Diagnosis not present

## 2024-12-19 DIAGNOSIS — Z8249 Family history of ischemic heart disease and other diseases of the circulatory system: Secondary | ICD-10-CM | POA: Diagnosis not present

## 2024-12-19 DIAGNOSIS — J159 Unspecified bacterial pneumonia: Secondary | ICD-10-CM | POA: Diagnosis not present

## 2024-12-19 DIAGNOSIS — Z91013 Allergy to seafood: Secondary | ICD-10-CM

## 2024-12-19 DIAGNOSIS — J189 Pneumonia, unspecified organism: Secondary | ICD-10-CM

## 2024-12-19 DIAGNOSIS — I10 Essential (primary) hypertension: Secondary | ICD-10-CM | POA: Diagnosis present

## 2024-12-19 DIAGNOSIS — Z882 Allergy status to sulfonamides status: Secondary | ICD-10-CM

## 2024-12-19 DIAGNOSIS — Z91010 Allergy to peanuts: Secondary | ICD-10-CM

## 2024-12-19 DIAGNOSIS — E119 Type 2 diabetes mellitus without complications: Secondary | ICD-10-CM | POA: Diagnosis present

## 2024-12-19 DIAGNOSIS — Z6841 Body Mass Index (BMI) 40.0 and over, adult: Secondary | ICD-10-CM | POA: Diagnosis not present

## 2024-12-19 DIAGNOSIS — Z833 Family history of diabetes mellitus: Secondary | ICD-10-CM | POA: Diagnosis not present

## 2024-12-19 LAB — RESP PANEL BY RT-PCR (RSV, FLU A&B, COVID)  RVPGX2
Influenza A by PCR: NEGATIVE
Influenza B by PCR: NEGATIVE
Resp Syncytial Virus by PCR: NEGATIVE
SARS Coronavirus 2 by RT PCR: NEGATIVE

## 2024-12-19 LAB — BASIC METABOLIC PANEL WITH GFR
Anion gap: 10 (ref 5–15)
BUN: 11 mg/dL (ref 8–23)
CO2: 26 mmol/L (ref 22–32)
Calcium: 10.6 mg/dL — ABNORMAL HIGH (ref 8.9–10.3)
Chloride: 108 mmol/L (ref 98–111)
Creatinine, Ser: 0.72 mg/dL (ref 0.44–1.00)
GFR, Estimated: >60 mL/min
Glucose, Bld: 182 mg/dL — ABNORMAL HIGH (ref 70–99)
Potassium: 4.3 mmol/L (ref 3.5–5.1)
Sodium: 143 mmol/L (ref 135–145)

## 2024-12-19 LAB — CBC
HCT: 46 % (ref 36.0–46.0)
Hemoglobin: 14.4 g/dL (ref 12.0–15.0)
MCH: 26.6 pg (ref 26.0–34.0)
MCHC: 31.3 g/dL (ref 30.0–36.0)
MCV: 84.9 fL (ref 80.0–100.0)
Platelets: 141 K/uL — ABNORMAL LOW (ref 150–400)
RBC: 5.42 MIL/uL — ABNORMAL HIGH (ref 3.87–5.11)
RDW: 13.4 % (ref 11.5–15.5)
WBC: 12 K/uL — ABNORMAL HIGH (ref 4.0–10.5)
nRBC: 0 % (ref 0.0–0.2)

## 2024-12-19 LAB — PRO BRAIN NATRIURETIC PEPTIDE: Pro Brain Natriuretic Peptide: 246 pg/mL

## 2024-12-19 LAB — PROCALCITONIN: Procalcitonin: <0.1 ng/mL

## 2024-12-19 LAB — GLUCOSE, CAPILLARY: Glucose-Capillary: 258 mg/dL — ABNORMAL HIGH (ref 70–99)

## 2024-12-19 LAB — TROPONIN T, HIGH SENSITIVITY: Troponin T High Sensitivity: <15 ng/L (ref 0–19)

## 2024-12-19 MED ORDER — ACETAMINOPHEN ER 650 MG PO TBCR: 650.0000 mg | EXTENDED_RELEASE_TABLET | Freq: Three times a day (TID) | ORAL | Status: DC

## 2024-12-19 MED ORDER — ACETAMINOPHEN 650 MG RE SUPP: 650.0000 mg | Freq: Four times a day (QID) | RECTAL | Status: DC | PRN

## 2024-12-19 MED ORDER — ONDANSETRON HCL 4 MG PO TABS: 4.0000 mg | ORAL_TABLET | Freq: Four times a day (QID) | ORAL | Status: DC | PRN

## 2024-12-19 MED ORDER — ATORVASTATIN CALCIUM 40 MG PO TABS
40.0000 mg | ORAL_TABLET | Freq: Every day | ORAL | Status: DC
  Administered 2024-12-20 – 2024-12-23 (×4): 40 mg via ORAL
  Filled 2024-12-19 (×4): qty 1

## 2024-12-19 MED ORDER — INSULIN ASPART 100 UNIT/ML IJ SOLN
0.0000 [IU] | Freq: Three times a day (TID) | INTRAMUSCULAR | Status: DC
  Administered 2024-12-20 (×2): 8 [IU] via SUBCUTANEOUS
  Administered 2024-12-20: 12 [IU] via SUBCUTANEOUS
  Administered 2024-12-21 (×2): 4 [IU] via SUBCUTANEOUS
  Administered 2024-12-22 (×2): 2 [IU] via SUBCUTANEOUS
  Administered 2024-12-23: 4 [IU] via SUBCUTANEOUS
  Filled 2024-12-19: qty 2
  Filled 2024-12-19: qty 4
  Filled 2024-12-19: qty 12
  Filled 2024-12-19: qty 5
  Filled 2024-12-19: qty 3
  Filled 2024-12-19: qty 8
  Filled 2024-12-19 (×3): qty 2
  Filled 2024-12-19: qty 4

## 2024-12-19 MED ORDER — CARVEDILOL 12.5 MG PO TABS
25.0000 mg | ORAL_TABLET | Freq: Two times a day (BID) | ORAL | Status: DC
  Administered 2024-12-19 – 2024-12-23 (×8): 25 mg via ORAL
  Filled 2024-12-19 (×8): qty 2

## 2024-12-19 MED ORDER — ONDANSETRON HCL 4 MG/2ML IJ SOLN: 4.0000 mg | Freq: Four times a day (QID) | INTRAMUSCULAR | Status: DC | PRN

## 2024-12-19 MED ORDER — LABETALOL HCL 5 MG/ML IV SOLN
10.0000 mg | Freq: Once | INTRAVENOUS | Status: AC
  Administered 2024-12-19: 10 mg via INTRAVENOUS
  Filled 2024-12-19: qty 4

## 2024-12-19 MED ORDER — IOHEXOL 350 MG/ML SOLN: 75.0000 mL | Freq: Once | INTRAVENOUS | Status: DC | PRN

## 2024-12-19 MED ORDER — ENOXAPARIN SODIUM 40 MG/0.4ML IJ SOSY
40.0000 mg | PREFILLED_SYRINGE | INTRAMUSCULAR | Status: DC
  Administered 2024-12-19 – 2024-12-22 (×4): 40 mg via SUBCUTANEOUS
  Filled 2024-12-19 (×4): qty 0.4

## 2024-12-19 MED ORDER — SODIUM CHLORIDE 0.9 % IV SOLN
500.0000 mg | Freq: Once | INTRAVENOUS | Status: AC
  Administered 2024-12-19: 500 mg via INTRAVENOUS
  Filled 2024-12-19: qty 5

## 2024-12-19 MED ORDER — SODIUM CHLORIDE 0.9 % IV SOLN
1.0000 g | Freq: Once | INTRAVENOUS | Status: AC
  Administered 2024-12-19: 1 g via INTRAVENOUS
  Filled 2024-12-19: qty 10

## 2024-12-19 MED ORDER — ACETAMINOPHEN 325 MG PO TABS: 650.0000 mg | ORAL_TABLET | Freq: Four times a day (QID) | ORAL | Status: DC | PRN

## 2024-12-19 MED ORDER — AMLODIPINE BESYLATE 5 MG PO TABS
10.0000 mg | ORAL_TABLET | Freq: Every day | ORAL | Status: DC
  Administered 2024-12-19 – 2024-12-22 (×4): 10 mg via ORAL
  Filled 2024-12-19 (×5): qty 2

## 2024-12-19 MED ORDER — INSULIN GLARGINE-YFGN 100 UNIT/ML ~~LOC~~ SOLN
16.0000 [IU] | Freq: Every day | SUBCUTANEOUS | Status: DC
  Administered 2024-12-20 – 2024-12-22 (×4): 16 [IU] via SUBCUTANEOUS
  Filled 2024-12-19 (×5): qty 0.16

## 2024-12-19 MED ORDER — IRBESARTAN 150 MG PO TABS
300.0000 mg | ORAL_TABLET | Freq: Every day | ORAL | Status: DC
  Administered 2024-12-19 – 2024-12-23 (×5): 300 mg via ORAL
  Filled 2024-12-19 (×5): qty 2

## 2024-12-19 NOTE — ED Provider Notes (Signed)
 " [facility] [facility] AT [facility] Provider Note  CSN: [id number] Arrival date & time: 12/19/24 1639  Chief Complaint(s) Shortness of Breath  HPI Alexandra Williams is a 84 y.o. female here today for shortness of breath and cough of 1 day duration.  Patient reports that starting yesterday started to have a cough, endorses productive cough with green sputum.  She went to her PCPs office who recommended she come to the ED when her O2 sats were noted to be 85 to 90% on room air.  She does not have a history of COPD or heart failure.  She does have lymphedema.   Past Medical History Past Medical History:  Diagnosis Date   Abnormal Pap smear 4/82   colpo/ecc negative   Arthritis    knee   Cervical polyp 1/99   Colon polyps    Diabetes mellitus without complication (HCC)    pre-diabetes   Fibrocystic breast    Hives    undetermined-on 7 antihistamines   Hyperlipidemia    Hypertension    Postmenopausal bleeding 3/99   on HRT/endometrial biopsy-focal simple hyperplasia   Uterine fibroid    Vasculitis    bilat legs   Vitamin D deficiency    Patient Active Problem List   Diagnosis Date Noted   Community acquired bacterial pneumonia 12/19/2024   Bronchitis 12/07/2022   Primary insomnia 07/05/2021   Postmenopausal 07/05/2021   Osteoarthritis of right knee 08/20/2018   Osteoarthritis of left knee 07/17/2018   Heart murmur 08/02/2017   Diabetes type 2, controlled (HCC) 10/13/2016   Morbid obesity (HCC) 10/08/2014   Vasculitis 10/08/2014   Essential hypertension 09/29/2014   Lymphedema 02/18/2014   Mixed hyperlipidemia 05/14/2008   Home Medication(s) Prior to Admission medications  Medication Sig Start Date End Date Taking? Authorizing Provider  acetaminophen (TYLENOL) 650 MG CR tablet Take 650 mg by mouth every 8 (eight) hours as needed for pain or fever.    [provider]  amLODipine  (NORVASC ) 10 MG tablet Take 1 tablet (10 mg total) by mouth  daily. 03/04/24   Lucien Orren SAILOR, PA-C  aspirin 81 MG tablet Take 81 mg by mouth daily.    [provider]  atorvastatin  (LIPITOR) 40 MG tablet TAKE ONE TABLET BY MOUTH DAILY 03/13/22   Dann Candyce RAMAN, MD  carvedilol  (COREG ) 25 MG tablet Take 1 tablet (25 mg total) by mouth 2 (two) times daily. 04/14/24   Lucien Orren SAILOR, PA-C  cyanocobalamin  100 MCG tablet Take 1 tablet by mouth daily.    [provider]  EPIPEN 2-PAK 0.3 MG/0.3ML SOAJ Inject 0.3 mg into the muscle as needed (HIVES). 05/22/13   [provider]  glipiZIDE (GLUCOTROL XL) 5 MG 24 hr tablet Take 5 mg by mouth daily with breakfast. 06/25/23   [provider]  loratadine (CLARITIN) 10 MG tablet Take 1 tablet by mouth every evening.    [provider]  Multiple Vitamin (MULTIVITAMIN) tablet Take 1 tablet by mouth daily.    [provider]  olmesartan (BENICAR) 40 MG tablet Take 40 mg by mouth daily.    [provider]  AISHA PASTOR LANCETS 33G MISC  09/24/16   [provider]  [facility] VERIO test strip  09/24/16   [provider]  zolpidem  (AMBIEN ) 5 MG tablet TAKE ONE TABLET BY MOUTH AT BEDTIME 07/02/24   Cleotilde Ronal RAMAN, MD  Past Surgical History Past Surgical History:  Procedure Laterality Date   BREAST BIOPSY Left    ESOPHAGOGASTRODUODENOSCOPY (EGD) WITH PROPOFOL  N/A 09/19/2023   Procedure: ESOPHAGOGASTRODUODENOSCOPY (EGD) WITH PROPOFOL ;  Surgeon: Burnette Fallow, MD;  Location: [facility];  Service: Gastroenterology;  Laterality: N/A;   FINE NEEDLE ASPIRATION N/A 09/19/2023   Procedure: FINE NEEDLE ASPIRATION (FNA) LINEAR;  Surgeon: Burnette Fallow, MD;  Location: [facility];  Service: Gastroenterology;  Laterality: N/A;   INCISION AND DRAINAGE     Bartholin cyst   TONSILLECTOMY AND ADENOIDECTOMY     as a child   UPPER  ESOPHAGEAL ENDOSCOPIC ULTRASOUND (EUS) Bilateral 09/19/2023   Procedure: UPPER ESOPHAGEAL ENDOSCOPIC ULTRASOUND (EUS);  Surgeon: Burnette Fallow, MD;  Location: [facility];  Service: Gastroenterology;  Laterality: Bilateral;   Family History Family History  Problem Relation Age of Onset   Pancreatic cancer Mother    Cancer Mother    Diabetes Mother    Hyperlipidemia Mother    Hypertension Mother    Heart attack Father    Ulcers Father    Heart disease Father    Kidney disease Father    Cancer Father    Hypertension Father    Mental retardation Daughter    Cancer Paternal Aunt        mouth   Breast cancer Neg Hx    BRCA 1/2 Neg Hx     Social History Social History[1] Allergies Codeine, Peanuts [peanut oil], Shellfish allergy, Sulfites, Tessalon [benzonatate], Amoxicillin-pot clavulanate, Hylan g-f 20, Tramadol, and Elemental sulfur  Review of Systems Review of Systems  Physical Exam Vital Signs  I have reviewed the triage vital signs BP (!) 202/65 (BP Location: Left Arm)   Pulse 78   Temp 98.5 F (36.9 C) (Oral)   Resp 16   LMP 12/18/2000   SpO2 95%   Physical Exam Vitals and nursing note reviewed.  Constitutional:      Appearance: She is not toxic-appearing.  HENT:     Head: Normocephalic and atraumatic.  Cardiovascular:     Rate and Rhythm: Normal rate.  Pulmonary:     Effort: Pulmonary effort is normal.     Breath sounds: Wheezing present.  Chest:     Chest wall: No mass or tenderness.  Abdominal:     Palpations: Abdomen is soft.  Musculoskeletal:        General: Normal range of motion.     Cervical back: Normal range of motion.  Skin:    General: Skin is warm and dry.  Neurological:     General: No focal deficit present.     Mental Status: She is alert.     ED Results and Treatments Labs (all labs ordered are listed, but only abnormal results are displayed) Labs Reviewed  BASIC METABOLIC PANEL WITH GFR - Abnormal; Notable for the following  components:      Result Value   Glucose, Bld 182 (*)    Calcium  10.6 (*)    All other components within normal limits  CBC - Abnormal; Notable for the following components:   WBC 12.0 (*)    RBC 5.42 (*)    Platelets 141 (*)    All other components within normal limits  RESP PANEL BY RT-PCR (RSV, FLU A&B, COVID)  RVPGX2  PRO BRAIN NATRIURETIC PEPTIDE  PROCALCITONIN  TROPONIN T, HIGH SENSITIVITY  Radiology CT Chest Wo Contrast Result Date: 12/19/2024 EXAM: CT CHEST WITHOUT CONTRAST 12/19/2024 07:30:05 PM TECHNIQUE: CT of the chest was performed without the administration of intravenous contrast. Multiplanar reformatted images are provided for review. Automated exposure control, iterative reconstruction, and/or weight based adjustment of the mA/kV was utilized to reduce the radiation dose to as low as reasonably achievable. COMPARISON: Chest x-ray 12/19/2024. CLINICAL HISTORY: Pneumonia, complication suspected, xray done. FINDINGS: MEDIASTINUM: Heart: At least 3-vessel coronary artery calcifications. Pericardium is unremarkable. The central airways are clear. Thoracic aorta and main pulmonary arteries are normal in caliber. Mild atherosclerotic plaque. Thyroid  with hypodense nodules measuring up to 1.7 cm. Esophagus is unremarkable. LYMPH NODES: No gross hilar adenopathy with limited evaluation on this noncontrast study. No mediastinal or axillary lymphadenopathy. LUNGS AND PLEURA: Lingular atelectasis. Bibasilar atelectasis. Right lower lobe subpleural micronodules (3: 115). No pulmonary edema. No pleural effusion or pneumothorax. SOFT TISSUES/BONES: Spondyl noted. Multilevel degenerative changes of the spine. No acute abnormality of the soft tissues. UPPER ABDOMEN: Limited images of the upper abdomen demonstrate an atrophic pancreas partially visualized. IMPRESSION: 1. No acute  findings. 2. Hypodense thyroid  nodules measuring up to 1.7 cm; recommend non-emergent thyroid  ultrasound. 3. Right lower lobe subpleural micronodules; no routine follow-up imaging is recommended per Fleischner Society Guidelines. Electronically signed by: Morgane Naveau MD 12/19/2024 07:44 PM EST RP Workstation: [id number]   DG Chest 2 View Result Date: 12/19/2024 CLINICAL DATA:  Shortness of breath cough EXAM: CHEST - 2 VIEW COMPARISON:  11/03/2013 FINDINGS: Mild cardiomegaly with slight central congestion. No focal opacity, pleural effusion or pneumothorax. Aortic atherosclerosis IMPRESSION: Mild cardiomegaly with slight central congestion. Electronically Signed   By: Luke Bun M.D.   On: 12/19/2024 18:49    Pertinent labs & imaging results that were available during my care of the patient were reviewed by me and considered in my medical decision making (see MDM for details).  Medications Ordered in ED Medications  iohexol  (OMNIPAQUE ) 350 MG/ML injection 75 mL (has no administration in time range)  azithromycin  (ZITHROMAX ) 500 mg in sodium chloride  0.9 % 250 mL IVPB (has no administration in time range)  labetalol (NORMODYNE) injection 10 mg (has no administration in time range)  acetaminophen (TYLENOL) CR tablet 650 mg (has no administration in time range)  amLODipine  (NORVASC ) tablet 10 mg (has no administration in time range)  atorvastatin  (LIPITOR) tablet 40 mg (has no administration in time range)  carvedilol  (COREG ) tablet 25 mg (has no administration in time range)  irbesartan (AVAPRO) tablet 300 mg (has no administration in time range)  enoxaparin (LOVENOX) injection 40 mg (has no administration in time range)  acetaminophen (TYLENOL) tablet 650 mg (has no administration in time range)    Or  acetaminophen (TYLENOL) suppository 650 mg (has no administration in time range)  ondansetron  (ZOFRAN ) tablet 4 mg (has no administration in time range)    Or  ondansetron  (ZOFRAN ) injection  4 mg (has no administration in time range)  cefTRIAXone (ROCEPHIN) 1 g in sodium chloride  0.9 % 100 mL IVPB (1 g Intravenous New Bag/Given 12/19/24 1944)  Procedures .Critical Care  Performed by: Mannie Fairy DASEN, DO Authorized by: Mannie Fairy DASEN, DO   Critical care provider statement:    Critical care time (minutes):  33   Critical care was necessary to treat or prevent imminent or life-threatening deterioration of the following conditions:  Respiratory failure   Critical care was time spent personally by me on the following activities:  Development of treatment plan with patient or surrogate, discussions with consultants, evaluation of patient's response to treatment, examination of patient, ordering and review of laboratory studies, ordering and review of radiographic studies, ordering and performing treatments and interventions, pulse oximetry, re-evaluation of patient's condition and review of old charts   (including critical care time)  Medical Decision Making / ED Course   This patient presents to the ED for concern of shortness of breath and cough, this involves an extensive number of treatment options, and is a complaint that carries with it a high risk of complications and morbidity.  The differential diagnosis includes pneumonia, influenza, less likely COPD, less likely CHF, consider pleural effusion, less likely PE.  MDM: With patient's constellation of symptoms, suspect likely pneumonia versus influenza.  Patient is requiring supplemental O2, is doing well on 2 L nasal cannula.  Initial EKG per my independent review shows no evidence of acute ischemia.  No overt pulmonary edema on bedside ultrasound.  Reassessment 8:30 PM-patient's chest x-ray to look, obtain CT imaging which did not show an obvious pneumonia.  I have added on a procalcitonin for  the patient.  With her symptoms of cough, productive cough and green mucus, I do think pneumonia is more likely.  However, patient's procalcitonin does not elevate on her symptoms worsen, pretreatment due to contrast allergy or CTA PE may be warranted.  Patient continues to look well.  Will admit her to the hospitalist service.  Mild leukocytosis to 12.     Additional history obtained: -Additional history obtained from EMS -External records from outside source obtained and reviewed including: Chart review including previous notes, labs, imaging, consultation notes   Lab Tests: -I ordered, reviewed, and interpreted labs.   The pertinent results include:   Labs Reviewed  BASIC METABOLIC PANEL WITH GFR - Abnormal; Notable for the following components:      Result Value   Glucose, Bld 182 (*)    Calcium  10.6 (*)    All other components within normal limits  CBC - Abnormal; Notable for the following components:   WBC 12.0 (*)    RBC 5.42 (*)    Platelets 141 (*)    All other components within normal limits  RESP PANEL BY RT-PCR (RSV, FLU A&B, COVID)  RVPGX2  PRO BRAIN NATRIURETIC PEPTIDE  PROCALCITONIN  TROPONIN T, HIGH SENSITIVITY      EKG no ST segment depressions or elevation, no T wave inversions, no acute ischemia  EKG Interpretation Date/Time:  Friday December 19 2024 17:03:58 EST Ventricular Rate:  84 PR Interval:  195 QRS Duration:  105 QT Interval:  383 QTC Calculation: 453 R Axis:   38  Text Interpretation: Sinus rhythm Confirmed by Mannie Fairy [phone]) on 12/19/2024 5:16:54 PM         Imaging Studies ordered: I ordered imaging studies including x-ray, CT chest I independently visualized and interpreted imaging. I agree with the radiologist interpretation   Medicines ordered and prescription drug management: Meds ordered this encounter  Medications   iohexol  (OMNIPAQUE ) 350 MG/ML injection 75 mL   cefTRIAXone (ROCEPHIN) 1 g in  sodium chloride  0.9 %  100 mL IVPB    Antibiotic Indication::   CAP   azithromycin  (ZITHROMAX ) 500 mg in sodium chloride  0.9 % 250 mL IVPB   labetalol (NORMODYNE) injection 10 mg   acetaminophen (TYLENOL) CR tablet 650 mg   amLODipine  (NORVASC ) tablet 10 mg   atorvastatin  (LIPITOR) tablet 40 mg   carvedilol  (COREG ) tablet 25 mg   irbesartan (AVAPRO) tablet 300 mg   enoxaparin (LOVENOX) injection 40 mg   OR Linked Order Group    acetaminophen (TYLENOL) tablet 650 mg    acetaminophen (TYLENOL) suppository 650 mg   OR Linked Order Group    ondansetron  (ZOFRAN ) tablet 4 mg    ondansetron  (ZOFRAN ) injection 4 mg    -I have reviewed the patients home medicines and have made adjustments as needed  Critical interventions Management of hypoxia    Cardiac Monitoring: The patient was maintained on a cardiac monitor.  I personally viewed and interpreted the cardiac monitored which showed an underlying rhythm of: Normal sinus rhythm    Reevaluation: After the interventions noted above, I reevaluated the patient and found that they have :improved  Co morbidities that complicate the patient evaluation  Past Medical History:  Diagnosis Date   Abnormal Pap smear 4/82   colpo/ecc negative   Arthritis    knee   Cervical polyp 1/99   Colon polyps    Diabetes mellitus without complication (HCC)    pre-diabetes   Fibrocystic breast    Hives    undetermined-on 7 antihistamines   Hyperlipidemia    Hypertension    Postmenopausal bleeding 3/99   on HRT/endometrial biopsy-focal simple hyperplasia   Uterine fibroid    Vasculitis    bilat legs   Vitamin D deficiency         Final Clinical Impression(s) / ED Diagnoses Final diagnoses:  Hypoxia  Community acquired pneumonia, unspecified laterality     @PCDICTATION @     [1]  Social History Tobacco Use   Smoking status: Never   Smokeless tobacco: Never  Vaping Use   Vaping status: Never Used  Substance Use Topics   Alcohol use: No     Alcohol/week: 0.0 standard drinks of alcohol   Drug use: No     Mannie Pac T, DO 12/19/24 2036  "

## 2024-12-19 NOTE — ED Notes (Signed)
 Patient was 85% on RA on triage. 3L [place] started.

## 2024-12-19 NOTE — H&P (Signed)
 " History and Physical    Alexandra Williams FMW:[id number] DOB: 1941-07-03 DOA: 12/19/2024  PCP: [name], MD   Chief Complaint:  sob  HPI: Alexandra Williams is a 84 y.o. female with medical history significant of with hx of HTN, HLD who presented with cough and sob for last 24 hours. Patients developed a productive cough and sob. She went to PCP who foundd her hypoxic and recommended presentation to the ED. On arrival she was afebrile and hypoxic satting in the high 80s. She was placed on supplemental oxygen.  Labs were obtained and present Tatian which showed BMP unrevealing, WBC 12.0, globin 14.4, BNP 246.  CT chest was obtained which showed no acute findings.  Chest x-ray showed no acute findings.   Review of Systems: Review of Systems  Constitutional: Negative.  Negative for chills and fever.  HENT: Negative.    Eyes: Negative.   Respiratory: Negative.    Cardiovascular: Negative.   Gastrointestinal: Negative.   Genitourinary: Negative.   Musculoskeletal: Negative.   Skin: Negative.   Neurological: Negative.   Endo/Heme/Allergies: Negative.   Psychiatric/Behavioral: Negative.       As per HPI otherwise 10 point review of systems negative.   Allergies[1]  Past Medical History:  Diagnosis Date   Abnormal Pap smear 4/82   colpo/ecc negative   Arthritis    knee   Cervical polyp 1/99   Colon polyps    Diabetes mellitus without complication (HCC)    pre-diabetes   Fibrocystic breast    Hives    undetermined-on 7 antihistamines   Hyperlipidemia    Hypertension    Postmenopausal bleeding 3/99   on HRT/endometrial biopsy-focal simple hyperplasia   Uterine fibroid    Vasculitis    bilat legs   Vitamin D deficiency     Past Surgical History:  Procedure Laterality Date   BREAST BIOPSY Left    ESOPHAGOGASTRODUODENOSCOPY (EGD) WITH PROPOFOL  N/A 09/19/2023   Procedure: ESOPHAGOGASTRODUODENOSCOPY (EGD) WITH PROPOFOL ;  Surgeon: Burnette Fallow, MD;  Location: [facility]  ENDOSCOPY;  Service: Gastroenterology;  Laterality: N/A;   FINE NEEDLE ASPIRATION N/A 09/19/2023   Procedure: FINE NEEDLE ASPIRATION (FNA) LINEAR;  Surgeon: Burnette Fallow, MD;  Location: [facility]OSCOPY;  Service: Gastroenterology;  Laterality: N/A;   INCISION AND DRAINAGE     Bartholin cyst   TONSILLECTOMY AND ADENOIDECTOMY     as a child   UPPER ESOPHAGEAL ENDOSCOPIC ULTRASOUND (EUS) Bilateral 09/19/2023   Procedure: UPPER ESOPHAGEAL ENDOSCOPIC ULTRASOUND (EUS);  Surgeon: Burnette Fallow, MD;  Location: [facility];  Service: Gastroenterology;  Laterality: Bilateral;     reports that she has never smoked. She has never used smokeless tobacco. She reports that she does not drink alcohol and does not use drugs.  Family History  Problem Relation Age of Onset   Pancreatic cancer Mother    Cancer Mother    Diabetes Mother    Hyperlipidemia Mother    Hypertension Mother    Heart attack Father    Ulcers Father    Heart disease Father    Kidney disease Father    Cancer Father    Hypertension Father    Mental retardation Daughter    Cancer Paternal Aunt        mouth   Breast cancer Neg Hx    BRCA 1/2 Neg Hx     Prior to Admission medications  Medication Sig Start Date End Date Taking? Authorizing Provider  acetaminophen  (TYLENOL ) 650 MG CR tablet Take 650 mg by mouth every  8 (eight) hours as needed for pain or fever.    [provider]  amLODipine  (NORVASC ) 10 MG tablet Take 1 tablet (10 mg total) by mouth daily. 03/04/24   Lucien Orren SAILOR, PA-C  aspirin 81 MG tablet Take 81 mg by mouth daily.    [provider]  atorvastatin  (LIPITOR) 40 MG tablet TAKE ONE TABLET BY MOUTH DAILY 03/13/22   Dann Candyce RAMAN, MD  carvedilol  (COREG ) 25 MG tablet Take 1 tablet (25 mg total) by mouth 2 (two) times daily. 04/14/24   Lucien Orren SAILOR, PA-C  cyanocobalamin  100 MCG tablet Take 1 tablet by mouth daily.    [provider]  EPIPEN 2-PAK 0.3 MG/0.3ML SOAJ Inject 0.3 mg  into the muscle as needed (HIVES). 05/22/13   [provider]  glipiZIDE (GLUCOTROL XL) 5 MG 24 hr tablet Take 5 mg by mouth daily with breakfast. 06/25/23   [provider]  loratadine (CLARITIN) 10 MG tablet Take 1 tablet by mouth every evening.    [provider]  Multiple Vitamin (MULTIVITAMIN) tablet Take 1 tablet by mouth daily.    [provider]  olmesartan (BENICAR) 40 MG tablet Take 40 mg by mouth daily.    [provider]  AISHA PASTOR LANCETS 33G MISC  09/24/16   [provider]  [facility] VERIO test strip  09/24/16   [provider]  zolpidem  (AMBIEN ) 5 MG tablet TAKE ONE TABLET BY MOUTH AT BEDTIME 07/02/24   Cleotilde Ronal RAMAN, MD    Physical Exam: Vitals:   12/19/24 1646 12/19/24 1650 12/19/24 1815 12/19/24 1845  BP: (!) 202/65     Pulse: 85 86 74 78  Resp:  20 18 16   Temp: 98.5 F (36.9 C)     TempSrc: Oral     SpO2: (!) 85% 90% 91% 95%   Physical Exam Constitutional:      Appearance: She is normal weight.  HENT:     Head: Normocephalic.     Mouth/Throat:     Mouth: Mucous membranes are moist.  Eyes:     Pupils: Pupils are equal, round, and reactive to light.  Cardiovascular:     Rate and Rhythm: Normal rate and regular rhythm.  Pulmonary:     Effort: Pulmonary effort is normal.     Breath sounds: Normal breath sounds.  Abdominal:     Palpations: Abdomen is soft.  Musculoskeletal:        General: Normal range of motion.     Cervical back: Normal range of motion.  Skin:    General: Skin is warm.  Neurological:     General: No focal deficit present.     Mental Status: She is alert.  Psychiatric:        Mood and Affect: Mood normal.        Labs on Admission: I have personally reviewed the patients's labs and imaging studies.  Assessment/Plan Principal Problem:   Community acquired bacterial pneumonia   # Acute hypoxic respiratory failure most likely secondary to bacterial commune acquired  pneumonia - Leukocytosis - Hypoxia - Productive cough-patient started on ceftriaxone  and azithromycin  in the emergency department - No acute infiltrates on chest imaging however patient's clinical presentation is most consistent with a bacterial pneumonia Plan: Scheduled nebs Ceftriaxone  and azithromycin  Administer 1 dose IV Lasix   # Hypertension-continue amlodipine , carvedilol , irbesartan   # Hyperlipidemia-continue statin    Admission status: Inpatient Med-Surg  Certification: The appropriate patient status for this patient is INPATIENT. Inpatient status  is judged to be reasonable and necessary in order to provide the required intensity of service to ensure the patient's safety. The patient's presenting symptoms, physical exam findings, and initial radiographic and laboratory data in the context of their chronic comorbidities is felt to place them at high risk for further clinical deterioration. Furthermore, it is not anticipated that the patient will be medically stable for discharge from the hospital within 2 midnights of admission.   * I certify that at the point of admission it is my clinical judgment that the patient will require inpatient hospital care spanning beyond 2 midnights from the point of admission due to high intensity of service, high risk for further deterioration and high frequency of surveillance required.DEWAINE Lamar Dess MD [facility] If 7PM-7AM, please contact night-coverage [email or web address]  12/19/2024, 8:30 PM        [1]  Allergies Allergen Reactions   Codeine Hives   Peanuts [Peanut Oil] Hives   Shellfish Allergy Hives   Sulfites Hives    preservatives preservatives   Tessalon [Benzonatate] Hives and Itching   Amoxicillin-Pot Clavulanate Hives   Hylan G-F 20    Tramadol Other (See Comments)    Does not tolerate well feels funny    Elemental Sulfur Itching   "

## 2024-12-19 NOTE — ED Triage Notes (Signed)
 Patient BIB EMS from Dr. Lawerance c/o cough and SOB x 1 day. Per report patient was wheezing and 90% on RA at Dr. Lawerance. Patient had 10mg  Albuterol , .5mg  atrovent and 125mg  Solumedrol by EMS. Patient denies Chest pain. Patient denies N/V.

## 2024-12-20 DIAGNOSIS — I890 Lymphedema, not elsewhere classified: Secondary | ICD-10-CM

## 2024-12-20 DIAGNOSIS — I10 Essential (primary) hypertension: Secondary | ICD-10-CM | POA: Diagnosis not present

## 2024-12-20 DIAGNOSIS — E66813 Obesity, class 3: Secondary | ICD-10-CM | POA: Diagnosis not present

## 2024-12-20 DIAGNOSIS — J159 Unspecified bacterial pneumonia: Secondary | ICD-10-CM

## 2024-12-20 LAB — GLUCOSE, CAPILLARY
Glucose-Capillary: 235 mg/dL — ABNORMAL HIGH (ref 70–99)
Glucose-Capillary: 280 mg/dL — ABNORMAL HIGH (ref 70–99)
Glucose-Capillary: 205 mg/dL — ABNORMAL HIGH (ref 70–99)
Glucose-Capillary: 223 mg/dL — ABNORMAL HIGH (ref 70–99)

## 2024-12-20 MED ORDER — IPRATROPIUM-ALBUTEROL 0.5-2.5 (3) MG/3ML IN SOLN
3.0000 mL | Freq: Four times a day (QID) | RESPIRATORY_TRACT | Status: DC
  Administered 2024-12-20 – 2024-12-23 (×12): 3 mL via RESPIRATORY_TRACT
  Filled 2024-12-20 (×12): qty 3

## 2024-12-20 MED ORDER — FUROSEMIDE 10 MG/ML IJ SOLN
20.0000 mg | Freq: Once | INTRAMUSCULAR | Status: DC
  Filled 2024-12-20: qty 2

## 2024-12-20 MED ORDER — AZITHROMYCIN 250 MG PO TABS
500.0000 mg | ORAL_TABLET | Freq: Every day | ORAL | Status: DC
  Administered 2024-12-20 – 2024-12-21 (×2): 500 mg via ORAL
  Filled 2024-12-20 (×2): qty 2

## 2024-12-20 MED ORDER — SODIUM CHLORIDE 0.9 % IV SOLN
2.0000 g | INTRAVENOUS | Status: DC
  Administered 2024-12-20 – 2024-12-22 (×3): 2 g via INTRAVENOUS
  Filled 2024-12-20 (×3): qty 20

## 2024-12-20 NOTE — Plan of Care (Signed)

## 2024-12-20 NOTE — Progress Notes (Addendum)
 " Progress Note   Patient: Alexandra Williams FMW:[id number] DOB: 02/04/1941 DOA: 12/19/2024     1 DOS: the patient was seen and examined on 12/20/2024   Brief hospital course: Alexandra Williams is a 84 y.o. female with medical history significant of with hx of HTN, HLD who presented with cough and sob for last 24 hours. Patients developed a productive cough and sob. She went to PCP who foundd her hypoxic and recommended presentation to the ED. On arrival she was afebrile and hypoxic satting in the high 80s. She was placed on supplemental oxygen.  Labs were obtained and present Tatian which showed BMP unrevealing, WBC 12.0, globin 14.4, BNP 246.  CT chest was obtained which showed no acute findings.  Chest x-ray showed no acute findings.   Assessment and Plan: Acute hypoxic respiratory failure- Community-acquired pneumonia- Patient does have productive cough with greenish phlegm, has leukocytosis and hypoxia upon presentation. Continue Rocephin  and azithromycin . Continue supplemental oxygen to maintain saturation greater than 92%. Continue DuoNebs Q6 hourly as needed. Patient guarded trial of IV Lasix , BNP within normal limits.  Will hold off on IV Lasix  at this time.  Hypertension: Blood pressure stable continue amlodipine , carvedilol  and irbesartan .  Hyperlipidemia: Continue statin therapy.  Chronic lymphedema: Continue supportive care. PT OT evaluation.  Morbid obesity BMI 47.44 Diet, exercise and weight reduction advised.    Encourage out of bed to chair. Incentive spirometry. Nursing supportive care. Fall, aspiration precautions. Diet:  Diet Orders (From admission, onward)     Start     Ordered   12/19/24 2028  Diet regular Room service appropriate? Yes; Fluid consistency: Thin  Diet effective now       Question Answer Comment  Room service appropriate? Yes   Fluid consistency: Thin      12/19/24 2027           DVT prophylaxis: enoxaparin  (LOVENOX ) injection 40 mg  Start: 12/19/24 2200 SCDs Start: 12/19/24 2027  Level of care: Med-Surg   Code Status: Full Code  Subjective: Patient is seen and examined today morning.  She is better than yesterday.  Cough present.  Refuses to take Lasix  due to her lymphedema.  Currently on 2 L supplemental oxygen, did not get out of bed.  Physical Exam: Vitals:   12/20/24 0227 12/20/24 0529 12/20/24 1029 12/20/24 1225  BP: (!) 173/60 (!) 183/57  (!) 128/47  Pulse:  73  63  Resp:  20  16  Temp:  98.7 F (37.1 C)  (!) 97.3 F (36.3 C)  TempSrc:      SpO2:  96% 94% 96%  Weight:      Height:        General - Elderly Caucasian female, no apparent distress HEENT - PERRLA, EOMI, atraumatic head, non tender sinuses. Lung - Clear, basal rales, rhonchi, no wheezes. Heart - S1, S2 heard, no murmurs, rubs, chronic bilateral pedal edema. Abdomen - Soft, non tender, bowel sounds good Neuro - Alert, awake and oriented x 3, non focal exam. Skin - Warm and dry.  Data Reviewed:      Latest Ref Rng & Units 12/19/2024    5:24 PM  CBC  WBC 4.0 - 10.5 K/uL 12.0   Hemoglobin 12.0 - 15.0 g/dL 85.5   Hematocrit 63.9 - 46.0 % 46.0   Platelets 150 - 400 K/uL 141       Latest Ref Rng & Units 12/19/2024    5:24 PM 08/01/2023    9:55 AM  BMP  Glucose 70 - 99 mg/dL 817    BUN 8 - 23 mg/dL 11    Creatinine 9.55 - 1.00 mg/dL 9.27  9.19   Sodium 864 - 145 mmol/L 143    Potassium 3.5 - 5.1 mmol/L 4.3    Chloride 98 - 111 mmol/L 108    CO2 22 - 32 mmol/L 26    Calcium  8.9 - 10.3 mg/dL 89.3     CT Chest Wo Contrast Result Date: 12/19/2024 EXAM: CT CHEST WITHOUT CONTRAST 12/19/2024 07:30:05 PM TECHNIQUE: CT of the chest was performed without the administration of intravenous contrast. Multiplanar reformatted images are provided for review. Automated exposure control, iterative reconstruction, and/or weight based adjustment of the mA/kV was utilized to reduce the radiation dose to as low as reasonably achievable. COMPARISON: Chest  x-ray 12/19/2024. CLINICAL HISTORY: Pneumonia, complication suspected, xray done. FINDINGS: MEDIASTINUM: Heart: At least 3-vessel coronary artery calcifications. Pericardium is unremarkable. The central airways are clear. Thoracic aorta and main pulmonary arteries are normal in caliber. Mild atherosclerotic plaque. Thyroid  with hypodense nodules measuring up to 1.7 cm. Esophagus is unremarkable. LYMPH NODES: No gross hilar adenopathy with limited evaluation on this noncontrast study. No mediastinal or axillary lymphadenopathy. LUNGS AND PLEURA: Lingular atelectasis. Bibasilar atelectasis. Right lower lobe subpleural micronodules (3: 115). No pulmonary edema. No pleural effusion or pneumothorax. SOFT TISSUES/BONES: Spondyl noted. Multilevel degenerative changes of the spine. No acute abnormality of the soft tissues. UPPER ABDOMEN: Limited images of the upper abdomen demonstrate an atrophic pancreas partially visualized. IMPRESSION: 1. No acute findings. 2. Hypodense thyroid  nodules measuring up to 1.7 cm; recommend non-emergent thyroid  ultrasound. 3. Right lower lobe subpleural micronodules; no routine follow-up imaging is recommended per Fleischner Society Guidelines. Electronically signed by: Morgane Naveau MD 12/19/2024 07:44 PM EST RP Workstation: [id number]   DG Chest 2 View Result Date: 12/19/2024 CLINICAL DATA:  Shortness of breath cough EXAM: CHEST - 2 VIEW COMPARISON:  11/03/2013 FINDINGS: Mild cardiomegaly with slight central congestion. No focal opacity, pleural effusion or pneumothorax. Aortic atherosclerosis IMPRESSION: Mild cardiomegaly with slight central congestion. Electronically Signed   By: Luke Bun M.D.   On: 12/19/2024 18:49    Family Communication: Discussed with patient, understand and agree. All questions answered.  Disposition: Status is: Inpatient Remains inpatient appropriate because: IV antibiotics, wean oxygen  Planned Discharge Destination: Home with Home  Health     Time spent: 46 minutes  Author: Concepcion Riser, MD 12/20/2024 2:37 PM Secure chat 7am to 7pm For on call review [email or web address].    "

## 2024-12-21 DIAGNOSIS — I890 Lymphedema, not elsewhere classified: Secondary | ICD-10-CM | POA: Diagnosis not present

## 2024-12-21 DIAGNOSIS — I10 Essential (primary) hypertension: Secondary | ICD-10-CM | POA: Diagnosis not present

## 2024-12-21 DIAGNOSIS — E66813 Obesity, class 3: Secondary | ICD-10-CM | POA: Diagnosis not present

## 2024-12-21 DIAGNOSIS — J159 Unspecified bacterial pneumonia: Secondary | ICD-10-CM | POA: Diagnosis not present

## 2024-12-21 LAB — GLUCOSE, CAPILLARY
Glucose-Capillary: 168 mg/dL — ABNORMAL HIGH (ref 70–99)
Glucose-Capillary: 111 mg/dL — ABNORMAL HIGH (ref 70–99)
Glucose-Capillary: 179 mg/dL — ABNORMAL HIGH (ref 70–99)
Glucose-Capillary: 204 mg/dL — ABNORMAL HIGH (ref 70–99)

## 2024-12-21 NOTE — Plan of Care (Signed)

## 2024-12-21 NOTE — Progress Notes (Signed)
 " Progress Note   Patient: Alexandra Williams FMW:[id number] DOB: [birth year] DOA: 12/19/2024     2 DOS: the patient was seen and examined on 12/21/2024   Brief hospital course: Alexandra Williams is a 84 y.o. female with medical history significant of with hx of HTN, HLD who presented with cough and sob for last 24 hours. Patients developed a productive cough and sob. She went to PCP who foundd her hypoxic and recommended presentation to the ED. On arrival she was afebrile and hypoxic satting in the high 80s. She was placed on supplemental oxygen.  Labs were obtained and present Tatian which showed BMP unrevealing, WBC 12.0, globin 14.4, BNP 246.  CT chest was obtained which showed no acute findings.  Chest x-ray showed no acute findings.   Assessment and Plan: Acute hypoxic respiratory failure- Community-acquired pneumonia- Patient does have productive cough with greenish phlegm, has leukocytosis and hypoxia upon presentation. Continue Rocephin  and azithromycin . Continue supplemental oxygen to maintain saturation greater than 92%. Continue DuoNebs Q6 hourly as needed. Continue to wean O2. Patient guarded trial of IV Lasix , BNP within normal limits.  Will hold off on IV Lasix  at this time. PT/ OT evaluation, out of bed. IS.  Hypertension: continue amlodipine , carvedilol  and irbesartan .  Hyperlipidemia: Continue statin therapy.  Chronic lymphedema: Continue supportive care. PT OT evaluation.  Morbid obesity BMI 47.44 Diet, exercise and weight reduction advised.    Encourage out of bed to chair. Incentive spirometry. Nursing supportive care. Fall, aspiration precautions. Diet:  Diet Orders (From admission, onward)     Start     Ordered   12/19/24 2028  Diet regular Room service appropriate? Yes; Fluid consistency: Thin  Diet effective now       Question Answer Comment  Room service appropriate? Yes   Fluid consistency: Thin      12/19/24 2027           DVT prophylaxis:  enoxaparin  (LOVENOX ) injection 40 mg Start: 12/19/24 2200 SCDs Start: 12/19/24 2027  Level of care: Med-Surg   Code Status: Full Code  Subjective: Patient is seen and examined today morning.  She is feeling better, has wheezes. Currently on 2 L supplemental oxygen, did not get out of bed.  Physical Exam: Vitals:   12/21/24 0532 12/21/24 0607 12/21/24 0910 12/21/24 1008  BP: (!) 183/62 (!) 152/59 (!) 136/59   Pulse: (!) 57 (!) 50 61   Resp: 18     Temp: 98.2 F (36.8 C)     TempSrc: Oral     SpO2: 97%   97%  Weight:      Height:        General - Elderly Caucasian female, no apparent distress HEENT - PERRLA, EOMI, atraumatic head, non tender sinuses. Lung - Clear, basal rales, rhonchi, diffuse wheezes. Heart - S1, S2 heard, no murmurs, rubs, chronic bilateral pedal edema. Abdomen - Soft, non tender, bowel sounds good Neuro - Alert, awake and oriented x 3, non focal exam. Skin - Warm and dry.  Data Reviewed:      Latest Ref Rng & Units 12/19/2024    5:24 PM  CBC  WBC 4.0 - 10.5 K/uL 12.0   Hemoglobin 12.0 - 15.0 g/dL 85.5   Hematocrit 63.9 - 46.0 % 46.0   Platelets 150 - 400 K/uL 141       Latest Ref Rng & Units 12/19/2024    5:24 PM 08/01/2023    9:55 AM  BMP  Glucose 70 - 99 mg/dL  182    BUN 8 - 23 mg/dL 11    Creatinine 9.55 - 1.00 mg/dL 9.27  9.19   Sodium 864 - 145 mmol/L 143    Potassium 3.5 - 5.1 mmol/L 4.3    Chloride 98 - 111 mmol/L 108    CO2 22 - 32 mmol/L 26    Calcium  8.9 - 10.3 mg/dL 89.3     CT Chest Wo Contrast Result Date: 12/19/2024 EXAM: CT CHEST WITHOUT CONTRAST 12/19/2024 07:30:05 PM TECHNIQUE: CT of the chest was performed without the administration of intravenous contrast. Multiplanar reformatted images are provided for review. Automated exposure control, iterative reconstruction, and/or weight based adjustment of the mA/kV was utilized to reduce the radiation dose to as low as reasonably achievable. COMPARISON: Chest x-ray 12/19/2024. CLINICAL  HISTORY: Pneumonia, complication suspected, xray done. FINDINGS: MEDIASTINUM: Heart: At least 3-vessel coronary artery calcifications. Pericardium is unremarkable. The central airways are clear. Thoracic aorta and main pulmonary arteries are normal in caliber. Mild atherosclerotic plaque. Thyroid  with hypodense nodules measuring up to 1.7 cm. Esophagus is unremarkable. LYMPH NODES: No gross hilar adenopathy with limited evaluation on this noncontrast study. No mediastinal or axillary lymphadenopathy. LUNGS AND PLEURA: Lingular atelectasis. Bibasilar atelectasis. Right lower lobe subpleural micronodules (3: 115). No pulmonary edema. No pleural effusion or pneumothorax. SOFT TISSUES/BONES: Spondyl noted. Multilevel degenerative changes of the spine. No acute abnormality of the soft tissues. UPPER ABDOMEN: Limited images of the upper abdomen demonstrate an atrophic pancreas partially visualized. IMPRESSION: 1. No acute findings. 2. Hypodense thyroid  nodules measuring up to 1.7 cm; recommend non-emergent thyroid  ultrasound. 3. Right lower lobe subpleural micronodules; no routine follow-up imaging is recommended per Fleischner Society Guidelines. Electronically signed by: Morgane Naveau MD 12/19/2024 07:44 PM EST RP Workstation: [id number]   DG Chest 2 View Result Date: 12/19/2024 CLINICAL DATA:  Shortness of breath cough EXAM: CHEST - 2 VIEW COMPARISON:  11/03/2013 FINDINGS: Mild cardiomegaly with slight central congestion. No focal opacity, pleural effusion or pneumothorax. Aortic atherosclerosis IMPRESSION: Mild cardiomegaly with slight central congestion. Electronically Signed   By: Luke Bun M.D.   On: 12/19/2024 18:49    Family Communication: Discussed with patient, understand and agree. All questions answered.  Disposition: Status is: Inpatient Remains inpatient appropriate because: IV antibiotics, wean oxygen  Planned Discharge Destination: Home with Home Health     Time spent: 44  minutes  Author: Concepcion Riser, MD 12/21/2024 12:00 PM Secure chat 7am to 7pm For on call review [email or web address]    "

## 2024-12-22 ENCOUNTER — Inpatient Hospital Stay (HOSPITAL_COMMUNITY)

## 2024-12-22 DIAGNOSIS — I890 Lymphedema, not elsewhere classified: Secondary | ICD-10-CM | POA: Diagnosis not present

## 2024-12-22 DIAGNOSIS — I10 Essential (primary) hypertension: Secondary | ICD-10-CM | POA: Diagnosis not present

## 2024-12-22 DIAGNOSIS — J159 Unspecified bacterial pneumonia: Secondary | ICD-10-CM | POA: Diagnosis not present

## 2024-12-22 DIAGNOSIS — E66813 Obesity, class 3: Secondary | ICD-10-CM | POA: Diagnosis not present

## 2024-12-22 LAB — CBC
HCT: 40.5 % (ref 36.0–46.0)
Hemoglobin: 12.7 g/dL (ref 12.0–15.0)
MCH: 26.7 pg (ref 26.0–34.0)
MCHC: 31.4 g/dL (ref 30.0–36.0)
MCV: 85.1 fL (ref 80.0–100.0)
Platelets: 164 K/uL (ref 150–400)
RBC: 4.76 MIL/uL (ref 3.87–5.11)
RDW: 13.5 % (ref 11.5–15.5)
WBC: 7.3 K/uL (ref 4.0–10.5)
nRBC: 0 % (ref 0.0–0.2)

## 2024-12-22 LAB — GLUCOSE, CAPILLARY
Glucose-Capillary: 118 mg/dL — ABNORMAL HIGH (ref 70–99)
Glucose-Capillary: 151 mg/dL — ABNORMAL HIGH (ref 70–99)
Glucose-Capillary: 138 mg/dL — ABNORMAL HIGH (ref 70–99)
Glucose-Capillary: 242 mg/dL — ABNORMAL HIGH (ref 70–99)

## 2024-12-22 MED ORDER — GUAIFENESIN ER 600 MG PO TB12
1200.0000 mg | ORAL_TABLET | Freq: Two times a day (BID) | ORAL | Status: DC
  Administered 2024-12-22 – 2024-12-23 (×3): 1200 mg via ORAL
  Filled 2024-12-22 (×3): qty 2

## 2024-12-22 MED ORDER — ALBUTEROL SULFATE (2.5 MG/3ML) 0.083% IN NEBU: 2.5000 mg | INHALATION_SOLUTION | RESPIRATORY_TRACT | Status: DC | PRN

## 2024-12-22 MED ORDER — ZOLPIDEM TARTRATE 5 MG PO TABS
5.0000 mg | ORAL_TABLET | Freq: Once | ORAL | Status: AC
  Administered 2024-12-22: 5 mg via ORAL
  Filled 2024-12-22: qty 1

## 2024-12-22 MED ORDER — AZITHROMYCIN 250 MG PO TABS
500.0000 mg | ORAL_TABLET | Freq: Every day | ORAL | Status: DC
  Administered 2024-12-22 – 2024-12-23 (×2): 500 mg via ORAL
  Filled 2024-12-22 (×2): qty 2

## 2024-12-22 NOTE — Evaluation (Addendum)
 Physical Therapy Evaluation Patient Details Name: Alexandra Williams MRN: [id number] DOB: 05-30-1941 Today's Date: 12/22/2024  History of Present Illness  Alexandra Williams is an 84 y.o. female presents with productive cough and shortness of breath, admitted with acut ehypoxic respiratory failure and community acquired pneumonia. PMH: HTN, HLD, diabetes  Clinical Impression  Pt admitted with above diagnosis. PTA, pt reports ind with SPC, RW or rollator, ind with simple meal and all self care tasks, denies falls, still drives. Pt reports living at [facility] [place] apartment, everything is handicapped accessible, facility provides meals and weekly cleaning service, also her daughter with autism live with her. On eval, pt in restroom on RA upon therapist's arrival, able to complete transfers modified ind without AD and with RW. Pt's SpO2 87% once seated on bedside when coming from restroom and improves to 93% within 2 minutes of pursed lip breathing. Pt amb 150 ft with RW, supv, minimally conversational, dyspnea noted, on RA with SpO2 85-93% while ambulating, upon return to room further desat to 83% and improves to 92% within 2 minutes of seated pursed lip breathing. Pt denies home O2 at baseline; of note, pt with painted nails on eval. Recommend HHPT for safe transition back to apartment if requiring O2 vs no follow up pending progress acutely. Pt currently with functional limitations due to the deficits listed below (see PT Problem List). Pt will benefit from acute skilled PT to increase their independence and safety with mobility to allow discharge.           If plan is discharge home, recommend the following:     Can travel by private vehicle        Equipment Recommendations None recommended by PT  Recommendations for Other Services       Functional Status Assessment Patient has had a recent decline in their functional status and demonstrates the ability to make significant improvements in function  in a reasonable and predictable amount of time.     Precautions / Restrictions Precautions Precautions: Fall Recall of Precautions/Restrictions: Intact Precaution/Restrictions Comments: mointor O2 Restrictions Weight Bearing Restrictions Per Provider Order: No      Mobility  Bed Mobility               General bed mobility comments: out of bed upon arrival    Transfers Overall transfer level: Modified independent Equipment used: None               General transfer comment: pt completes transfers from toilet and bedside without AD, BUE assisting as needed to power up    Ambulation/Gait Ambulation/Gait assistance: Supervision Gait Distance (Feet): 150 Feet Assistive device: Rolling walker (2 wheels) Gait Pattern/deviations: Step-through pattern, Decreased stride length Gait velocity: functional     General Gait Details: step through gait pattern, slightly wide BOS with increased lateral weight shifting due to leg girth, no overt LOB or unsteadiness noted, pt minimally conversational with mild dyspnea noted, cued for pursed lip breathing  Stairs            Wheelchair Mobility     Tilt Bed    Modified Rankin (Stroke Patients Only)       Balance Overall balance assessment: Needs assistance Sitting-balance support: Feet supported Sitting balance-Leahy Scale: Good     Standing balance support: During functional activity   Standing balance comment: static able to release BUE from RW, dynamic with RW  Pertinent Vitals/Pain Pain Assessment Pain Assessment: No/denies pain    Home Living                          Prior Function                       Extremity/Trunk Assessment   Upper Extremity Assessment Upper Extremity Assessment: Defer to OT evaluation    Lower Extremity Assessment Lower Extremity Assessment: Overall WFL for tasks assessed (AROM WFL, BLE lymphedema reported)     Cervical / Trunk Assessment Cervical / Trunk Assessment: Normal  Communication   Communication Communication: No apparent difficulties    Cognition Arousal: Alert Behavior During Therapy: WFL for tasks assessed/performed   PT - Cognitive impairments: No apparent impairments                         Following commands: Intact       Cueing Cueing Techniques: Verbal cues     General Comments General comments (skin integrity, edema, etc.): Pt in restroom on RA upon arrival, upon sitting on bedside SpO2 reads 87% and improves to 93% within 2 minutes of pursed lip breathing. Pt amb on RA with SpO2 93-85%, upon return to room desat to 83%, improves to 92% within 2 minutes of pursed lip breathing, mild dyspnea noted, audible wheezing and intermittent coughing noted - notified RN    Exercises     Assessment/Plan    PT Assessment Patient needs continued PT services  PT Problem List Decreased activity tolerance;Decreased balance;Decreased skin integrity       PT Treatment Interventions DME instruction;Gait training;Functional mobility training;Therapeutic activities;Therapeutic exercise;Balance training;Patient/family education    PT Goals (Current goals can be found in the Care Plan section)  Acute Rehab PT Goals Patient Stated Goal: return to [facility] PT Goal Formulation: With patient Time For Goal Achievement: 01/05/25 Potential to Achieve Goals: Good    Frequency Min 2X/week     Co-evaluation               AM-PAC PT 6 Clicks Mobility  Outcome Measure Help needed turning from your back to your side while in a flat bed without using bedrails?: None Help needed moving from lying on your back to sitting on the side of a flat bed without using bedrails?: None Help needed moving to and from a bed to a chair (including a wheelchair)?: None Help needed standing up from a chair using your arms (e.g., wheelchair or bedside chair)?: None Help needed to walk  in hospital room?: A Little Help needed climbing 3-5 steps with a railing? : A Little 6 Click Score: 22    End of Session Equipment Utilized During Treatment: Gait belt Activity Tolerance: Patient tolerated treatment well Patient left: in bed;with call bell/phone within reach;Other (comment) (seated at bedside eating lunch) Nurse Communication: Mobility status (SpO2) PT Visit Diagnosis: Other abnormalities of gait and mobility (R26.89)    Time: 8769-8751 PT Time Calculation (min) (ACUTE ONLY): 18 min   Charges:   PT Evaluation $PT Eval Low Complexity: 1 Low   PT General Charges $$ ACUTE PT VISIT: 1 Visit         Tori Arriel Victor PT, DPT 12/22/2024, 1:04 PM

## 2024-12-22 NOTE — Progress Notes (Signed)
 Patient received part of her IV rocephin  dose and then IV began leaking. Patient received approximately half of the bag. MD notified and MD states he will change antibiotics to oral.

## 2024-12-22 NOTE — Patient Instructions (Signed)
 SABRA

## 2024-12-22 NOTE — Progress Notes (Signed)
 " Progress Note   Patient: Alexandra Williams FMW:[id number] DOB: 02/23/1941 DOA: 12/19/2024     3 DOS: the patient was seen and examined on 12/22/2024   Brief hospital course: Alexandra Williams is a 84 y.o. female with medical history significant of with hx of HTN, HLD who presented with cough and sob for last 24 hours. Patients developed a productive cough and sob. She went to PCP who foundd her hypoxic and recommended presentation to the ED. On arrival she was afebrile and hypoxic satting in the high 80s. She was placed on supplemental oxygen.  Labs were obtained and present Tatian which showed BMP unrevealing, WBC 12.0, globin 14.4, BNP 246.  CT chest was obtained which showed no acute findings.  Chest x-ray showed no acute findings.   Assessment and Plan: Acute hypoxic respiratory failure- Community-acquired pneumonia- Patient does have productive cough with greenish phlegm, has leukocytosis and hypoxia upon presentation. Continue azithromycin  for 3 days. She is requiring 2L supplemental oxygen to maintain saturation greater than 92%. Continue DuoNebs Q6 hourly as needed. Continue to wean O2. Hold off on IV Lasix  at this time. PT/ OT evaluation, out of bed. IS.  Hypertension: continue amlodipine , carvedilol  and irbesartan .  Hyperlipidemia: Continue statin therapy.  Chronic lymphedema: Continue supportive care. PT/ OT evaluation and follow up.  Morbid obesity BMI 47.44 Diet, exercise and weight reduction advised.    Encourage out of bed to chair. Incentive spirometry. Nursing supportive care. Fall, aspiration precautions. Diet:  Diet Orders (From admission, onward)     Start     Ordered   12/19/24 2028  Diet regular Room service appropriate? Yes; Fluid consistency: Thin  Diet effective now       Question Answer Comment  Room service appropriate? Yes   Fluid consistency: Thin      12/19/24 2027           DVT prophylaxis: enoxaparin  (LOVENOX ) injection 40 mg Start:  12/19/24 2200 SCDs Start: 12/19/24 2027  Level of care: Med-Surg   Code Status: Full Code  Subjective: Patient is seen and examined today morning.  She is feeling better, did get out of bed. Currently on 2 L supplemental oxygen. Advised to work with PT.  Physical Exam: Vitals:   12/22/24 0753 12/22/24 1012 12/22/24 1030 12/22/24 1347  BP:  (!) 150/61    Pulse:  64    Resp:  18    Temp:      TempSrc:      SpO2: 96% 99% 94% 99%  Weight:      Height:        General - Elderly Caucasian female, no apparent distress HEENT - PERRLA, EOMI, atraumatic head, non tender sinuses. Lung - Clear, basal rales, rhonchi, diffuse wheezes. Heart - S1, S2 heard, no murmurs, rubs, chronic bilateral pedal edema. Abdomen - Soft, non tender, bowel sounds good Neuro - Alert, awake and oriented x 3, non focal exam. Skin - Warm and dry.  Data Reviewed:      Latest Ref Rng & Units 12/22/2024    4:27 AM 12/19/2024    5:24 PM  CBC  WBC 4.0 - 10.5 K/uL 7.3  12.0   Hemoglobin 12.0 - 15.0 g/dL 87.2  85.5   Hematocrit 36.0 - 46.0 % 40.5  46.0   Platelets 150 - 400 K/uL 164  141       Latest Ref Rng & Units 12/19/2024    5:24 PM 08/01/2023    9:55 AM  BMP  Glucose  70 - 99 mg/dL 817    BUN 8 - 23 mg/dL 11    Creatinine 9.55 - 1.00 mg/dL 9.27  9.19   Sodium 864 - 145 mmol/L 143    Potassium 3.5 - 5.1 mmol/L 4.3    Chloride 98 - 111 mmol/L 108    CO2 22 - 32 mmol/L 26    Calcium  8.9 - 10.3 mg/dL 89.3     No results found.   Family Communication: Discussed with patient, understand and agree. All questions answered.  Disposition: Status is: Inpatient Remains inpatient appropriate because: Abx, wean oxygen, PT/ OT  Planned Discharge Destination: Home with Home Health     Time spent: 46 minutes  Author: Concepcion Riser, MD 12/22/2024 2:20 PM Secure chat 7am to 7pm For on call review [email or web address].    "

## 2024-12-22 NOTE — Plan of Care (Signed)

## 2024-12-22 NOTE — Evaluation (Signed)
 Occupational Therapy Evaluation Patient Details Name: Alexandra Williams MRN: [id number] DOB: Mar 04, 1941 Today's Date: 12/22/2024   History of Present Illness   CELES DEDIC is an 84 y.o. female presents with productive cough and shortness of breath, admitted with acut ehypoxic respiratory failure and community acquired pneumonia. PMH: HTN, HLD, diabetes     Clinical Impressions Patient evaluated by Occupational Therapy with no further acute OT needs identified. All education has been completed and the patient has no further questions. Pt is currently functioning at or very close to their baseline. No follow-up Occupational Therapy or equipment needs. OT is signing off. Thank you for this referral.      If plan is discharge home, recommend the following:   A little help with bathing/dressing/bathroom     Functional Status Assessment   Patient has not had a recent decline in their functional status     Equipment Recommendations   None recommended by OT      Precautions/Restrictions   Precautions Precautions: Fall Recall of Precautions/Restrictions: Intact Precaution/Restrictions Comments: mointor O2 Restrictions Weight Bearing Restrictions Per Provider Order: No     Mobility Bed Mobility               General bed mobility comments: sitting EOB upon therapy arrival    Transfers Overall transfer level: Modified independent Equipment used: None       Balance Overall balance assessment: Mild deficits observed, not formally tested      ADL either performed or assessed with clinical judgement   ADL   General ADL Comments: With appropriate AE for LB, pt is able to complete all BADL tasks with set-up-min A     Vision Baseline Vision/History: 1 Wears glasses Ability to See in Adequate Light: 0 Adequate Patient Visual Report: No change from baseline Vision Assessment?: Wears glasses for reading     Perception Perception: Not tested       Praxis  Praxis: Not tested       Pertinent Vitals/Pain Pain Assessment Pain Assessment: No/denies pain     Extremity/Trunk Assessment Upper Extremity Assessment Upper Extremity Assessment: Overall WFL for tasks assessed   Lower Extremity Assessment Lower Extremity Assessment:  (Chronic BLE lyphedema)   Cervical / Trunk Assessment Cervical / Trunk Assessment: Other exceptions Cervical / Trunk Exceptions: body habitus   Communication Communication Communication: No apparent difficulties   Cognition Arousal: Alert Behavior During Therapy: WFL for tasks assessed/performed Cognition: No apparent impairments      Following commands: Intact       Cueing  General Comments   Cueing Techniques: Verbal cues  Patient education provided on energy conservation techniques and breathing exercises including pursed lip breathing and diaphragmatic breathing. Handout provided for reference with pt verbalizing understanding.           Home Living Family/patient expects to be discharged to:: Other (Comment) (IL[facility])       Additional Comments: Pt reports living in 2 bedroom apartment at [facility] [place], no stairs to navigate, bathroom handicap asscessible, daughter with autism lives with her      Prior Functioning/Environment Prior Level of Function : Independent/Modified Independent;Driving    Mobility Comments: pt reports ind with SPC, RW or rollator depending on the day, denies falls ADLs Comments: pt reports ind with self care, light cooking, and driving; facility provides meals and weekly cleaning service. Utilizes AE for LB ADL.    OT Problem List: Decreased activity tolerance;Impaired balance (sitting and/or standing)        OT  Goals(Current goals can be found in the care plan section)   Acute Rehab OT Goals OT Goal Formulation: All assessment and education complete, DC therapy   OT Frequency:   1x visit       AM-PAC OT 6 Clicks Daily Activity     Outcome Measure Help  from another person eating meals?: None Help from another person taking care of personal grooming?: None Help from another person toileting, which includes using toliet, bedpan, or urinal?: None Help from another person bathing (including washing, rinsing, drying)?: None Help from another person to put on and taking off regular upper body clothing?: None Help from another person to put on and taking off regular lower body clothing?: A Little 6 Click Score: 23   End of Session    Activity Tolerance: Patient tolerated treatment well Patient left: in bed;with call bell/phone within reach (sitting EOB)  OT Visit Diagnosis: Muscle weakness (generalized) (M62.81)                Time: 1233-1310 OT Time Calculation (min): 37 min Charges:  OT General Charges $OT Visit: 1 Visit OT Evaluation $OT Eval Low Complexity: 1 Low OT Treatments $Therapeutic Activity: 8-22 mins  Alexandra Howell, OTR/L,CBIS  Supplemental OT - [facility] and [facility] Secure Chat Preferred    Alexandra Williams, Alexandra Williams 12/22/2024, 2:29 PM

## 2024-12-23 DIAGNOSIS — J209 Acute bronchitis, unspecified: Secondary | ICD-10-CM | POA: Diagnosis not present

## 2024-12-23 DIAGNOSIS — E119 Type 2 diabetes mellitus without complications: Secondary | ICD-10-CM

## 2024-12-23 DIAGNOSIS — I10 Essential (primary) hypertension: Secondary | ICD-10-CM | POA: Diagnosis not present

## 2024-12-23 DIAGNOSIS — I890 Lymphedema, not elsewhere classified: Secondary | ICD-10-CM | POA: Diagnosis not present

## 2024-12-23 DIAGNOSIS — E042 Nontoxic multinodular goiter: Secondary | ICD-10-CM | POA: Diagnosis not present

## 2024-12-23 DIAGNOSIS — E782 Mixed hyperlipidemia: Secondary | ICD-10-CM | POA: Diagnosis not present

## 2024-12-23 LAB — CBC
HCT: 41.6 % (ref 36.0–46.0)
Hemoglobin: 12.8 g/dL (ref 12.0–15.0)
MCH: 26.1 pg (ref 26.0–34.0)
MCHC: 30.8 g/dL (ref 30.0–36.0)
MCV: 84.9 fL (ref 80.0–100.0)
Platelets: 142 K/uL — ABNORMAL LOW (ref 150–400)
RBC: 4.9 MIL/uL (ref 3.87–5.11)
RDW: 13.2 % (ref 11.5–15.5)
WBC: 4.9 K/uL (ref 4.0–10.5)
nRBC: 0 % (ref 0.0–0.2)

## 2024-12-23 LAB — GLUCOSE, CAPILLARY
Glucose-Capillary: 164 mg/dL — ABNORMAL HIGH (ref 70–99)
Glucose-Capillary: 128 mg/dL — ABNORMAL HIGH (ref 70–99)

## 2024-12-23 MED ORDER — AZITHROMYCIN 250 MG PO TABS: 250.0000 mg | ORAL_TABLET | Freq: Every day | ORAL | 0 refills | Status: AC

## 2024-12-23 MED ORDER — GUAIFENESIN ER 600 MG PO TB12: 1200.0000 mg | ORAL_TABLET | Freq: Two times a day (BID) | ORAL | 0 refills | Status: AC

## 2024-12-23 MED ORDER — ALBUTEROL SULFATE HFA 108 (90 BASE) MCG/ACT IN AERS: 2.0000 | INHALATION_SPRAY | Freq: Four times a day (QID) | RESPIRATORY_TRACT | 2 refills | Status: AC | PRN

## 2024-12-23 NOTE — Progress Notes (Signed)
 Pt discharged. No IV site present during assessment. All questions asked and answered. Pt confirmed prescriptions at pharmacy.

## 2024-12-23 NOTE — Plan of Care (Signed)

## 2024-12-23 NOTE — Discharge Summary (Incomplete)
 " Physician Discharge Summary   Patient: Alexandra Williams MRN: [id number] DOB: 10/15/1941  Admit date:     12/19/2024  Discharge date: 12/23/2024  Discharge Physician: Concepcion Riser   PCP: [name], MD   Recommendations at discharge:  {Tip this will not be part of the note when signed- Example include specific recommendations for outpatient follow-up, pending tests to follow-up on. (Optional):26781}  PCP follow up in 1 week. Thyroid  nodules work up as outpatient. Cardiology and pulmonology follow up suggested.  Discharge Diagnoses: Principal Problem:   Community acquired bacterial pneumonia  Resolved Problems:   * No resolved hospital problems. [facility] Course: No notes on file  Assessment and Plan: No notes have been filed under this hospital service. Service: Hospitalist     {Tip this will not be part of the note when signed Body mass index is 47.44 kg/m. , ,  (Optional):26781}  {(NOTE) Pain control PDMP Statment (Optional):26782} Consultants: none Procedures performed: none  Disposition: Home health Diet recommendation:  Discharge Diet Orders (From admission, onward)     Start     Ordered   12/23/24 0000  Diet - low sodium heart healthy        12/23/24 0935   12/23/24 0000  Diet Carb Modified        12/23/24 0935           Cardiac and Carb modified diet DISCHARGE MEDICATION: Allergies as of 12/23/2024       Reactions   Benzonatate Hives, Itching   Codeine Hives, Rash   Peanut Oil Hives, Rash   Shellfish Allergy Hives   Sulfites Hives, Other (See Comments)   Preservatives   Cannabidiol Hives   Hylan G-f 20    Iodinated Contrast Media Hives, Swelling   Metformin Hcl Diarrhea   Other Other (See Comments)   Polyester Fibers   Peanut-containing Drug Products    Strawberry Extract    Tramadol Other (See Comments)   Does not tolerate well - Felt funny and LSD Trip   Amoxicillin Hives, Rash   Amoxicillin-pot Clavulanate Hives,  Dermatitis   Elemental Sulfur Itching   Shellfish Protein-containing Drug Products Rash   Sulfa Antibiotics Itching, Rash        Medication List     TAKE these medications    acetaminophen  650 MG CR tablet Commonly known as: TYLENOL  Take 650 mg by mouth every 8 (eight) hours as needed for pain or fever.   albuterol  108 (90 Base) MCG/ACT inhaler Commonly known as: VENTOLIN  HFA Inhale 2 puffs into the lungs every 6 (six) hours as needed for wheezing or shortness of breath.   amLODipine  10 MG tablet Commonly known as: NORVASC  Take 1 tablet (10 mg total) by mouth daily. What changed: when to take this   aspirin 81 MG tablet Take 81 mg by mouth at bedtime.   atorvastatin  40 MG tablet Commonly known as: LIPITOR TAKE ONE TABLET BY MOUTH DAILY   azithromycin  250 MG tablet Commonly known as: ZITHROMAX  Take 1 tablet (250 mg total) by mouth daily.   carvedilol  25 MG tablet Commonly known as: COREG  Take 1 tablet (25 mg total) by mouth 2 (two) times daily.   cyanocobalamin  100 MCG tablet Take 1 tablet by mouth daily.   EpiPen 2-Pak 0.3 MG/0.3ML Soaj injection Generic drug: EPINEPHrine Inject 0.3 mg into the muscle as needed (HIVES).   fexofenadine 180 MG tablet Commonly known as: ALLEGRA Take 180-350 mg by mouth See admin instructions. Take 180mg  (1 tablet)  by mouth in the morning and 360mg  (2 tablets) at night.   glipiZIDE 5 MG 24 hr tablet Commonly known as: GLUCOTROL XL Take 5 mg by mouth daily with breakfast.   guaiFENesin  600 MG 12 hr tablet Commonly known as: MUCINEX  Take 2 tablets (1,200 mg total) by mouth 2 (two) times daily for 10 days.   hydroxychloroquine 200 MG tablet Commonly known as: PLAQUENIL Take 200 mg by mouth 2 (two) times daily.   multivitamin tablet Take 1 tablet by mouth daily.   olmesartan 40 MG tablet Commonly known as: BENICAR Take 40 mg by mouth every evening.   TOUJEO  MAX SOLOSTAR [place] Inject 16 Units into the skin at bedtime.    zolpidem  5 MG tablet Commonly known as: AMBIEN  TAKE ONE TABLET BY MOUTH AT BEDTIME        Discharge Exam: Filed Weights   12/19/24 2356  Weight: 106.5 kg      12/23/2024    9:09 AM 12/23/2024    5:38 AM 12/22/2024    9:17 PM  Vitals with BMI  Systolic 154 175 824  Diastolic 79 85 77  Pulse 68 66 69   General - Elderly Caucasian female, no apparent distress HEENT - PERRLA, EOMI, atraumatic head, non tender sinuses. Lung - Clear, basal rales, rhonchi, diffuse wheezes. Heart - S1, S2 heard, no murmurs, rubs, chronic bilateral pedal edema. Abdomen - Soft, non tender, bowel sounds good Neuro - Alert, awake and oriented x 3, non focal exam. Skin - Warm and dry.  Condition at discharge: stable  The results of significant diagnostics from this hospitalization (including imaging, microbiology, ancillary and laboratory) are listed below for reference.   Imaging Studies: CT Chest Wo Contrast Result Date: 12/19/2024 EXAM: CT CHEST WITHOUT CONTRAST 12/19/2024 07:30:05 PM TECHNIQUE: CT of the chest was performed without the administration of intravenous contrast. Multiplanar reformatted images are provided for review. Automated exposure control, iterative reconstruction, and/or weight based adjustment of the mA/kV was utilized to reduce the radiation dose to as low as reasonably achievable. COMPARISON: Chest x-ray 12/19/2024. CLINICAL HISTORY: Pneumonia, complication suspected, xray done. FINDINGS: MEDIASTINUM: Heart: At least 3-vessel coronary artery calcifications. Pericardium is unremarkable. The central airways are clear. Thoracic aorta and main pulmonary arteries are normal in caliber. Mild atherosclerotic plaque. Thyroid  with hypodense nodules measuring up to 1.7 cm. Esophagus is unremarkable. LYMPH NODES: No gross hilar adenopathy with limited evaluation on this noncontrast study. No mediastinal or axillary lymphadenopathy. LUNGS AND PLEURA: Lingular atelectasis. Bibasilar atelectasis. Right  lower lobe subpleural micronodules (3: 115). No pulmonary edema. No pleural effusion or pneumothorax. SOFT TISSUES/BONES: Spondyl noted. Multilevel degenerative changes of the spine. No acute abnormality of the soft tissues. UPPER ABDOMEN: Limited images of the upper abdomen demonstrate an atrophic pancreas partially visualized. IMPRESSION: 1. No acute findings. 2. Hypodense thyroid  nodules measuring up to 1.7 cm; recommend non-emergent thyroid  ultrasound. 3. Right lower lobe subpleural micronodules; no routine follow-up imaging is recommended per Fleischner Society Guidelines. Electronically signed by: Morgane Naveau MD 12/19/2024 07:44 PM EST RP Workstation: [id number]   DG Chest 2 View Result Date: 12/19/2024 CLINICAL DATA:  Shortness of breath cough EXAM: CHEST - 2 VIEW COMPARISON:  11/03/2013 FINDINGS: Mild cardiomegaly with slight central congestion. No focal opacity, pleural effusion or pneumothorax. Aortic atherosclerosis IMPRESSION: Mild cardiomegaly with slight central congestion. Electronically Signed   By: Luke Bun M.D.   On: 12/19/2024 18:49    Microbiology: Results for orders placed or performed during the hospital encounter of 12/19/24  Resp panel by RT-PCR (RSV, Flu A&B, Covid) Anterior Nasal Swab     Status: None   Collection Time: 12/19/24  5:39 PM   Specimen: Anterior Nasal Swab  Result Value Ref Range Status   SARS Coronavirus 2 by RT PCR NEGATIVE NEGATIVE Final    Comment: (NOTE) SARS-CoV-2 target nucleic acids are NOT DETECTED.  The SARS-CoV-2 RNA is generally detectable in upper respiratory specimens during the acute phase of infection. The lowest concentration of SARS-CoV-2 viral copies this assay can detect is 138 copies/mL. A negative result does not preclude SARS-Cov-2 infection and should not be used as the sole basis for treatment or other patient management decisions. A negative result may occur with  improper specimen collection/handling, submission of  specimen other than nasopharyngeal swab, presence of viral mutation(s) within the areas targeted by this assay, and inadequate number of viral copies(<138 copies/mL). A negative result must be combined with clinical observations, patient history, and epidemiological information. The expected result is Negative.  Fact Sheet for Patients:  bloggercourse.com  Fact Sheet for Healthcare Providers:  seriousbroker.it  This test is no t yet approved or cleared by the [place]  FDA and  has been authorized for detection and/or diagnosis of SARS-CoV-2 by FDA under an Emergency Use Authorization (EUA). This EUA will remain  in effect (meaning this test can be used) for the duration of the COVID-19 declaration under Section 564(b)(1) of the Act, 21 U.S.C.section 360bbb-3(b)(1), unless the authorization is terminated  or revoked sooner.       Influenza A by PCR NEGATIVE NEGATIVE Final   Influenza B by PCR NEGATIVE NEGATIVE Final    Comment: (NOTE) The Xpert Xpress SARS-CoV-2/FLU/RSV plus assay is intended as an aid in the diagnosis of influenza from Nasopharyngeal swab specimens and should not be used as a sole basis for treatment. Nasal washings and aspirates are unacceptable for Xpert Xpress SARS-CoV-2/FLU/RSV testing.  Fact Sheet for Patients: bloggercourse.com  Fact Sheet for Healthcare Providers: seriousbroker.it  This test is not yet approved or cleared by the [place]  FDA and has been authorized for detection and/or diagnosis of SARS-CoV-2 by FDA under an Emergency Use Authorization (EUA). This EUA will remain in effect (meaning this test can be used) for the duration of the COVID-19 declaration under Section 564(b)(1) of the Act, 21 U.S.C. section 360bbb-3(b)(1), unless the authorization is terminated or revoked.     Resp Syncytial Virus by PCR NEGATIVE NEGATIVE Final     Comment: (NOTE) Fact Sheet for Patients: bloggercourse.com  Fact Sheet for Healthcare Providers: seriousbroker.it  This test is not yet approved or cleared by the [place]  FDA and has been authorized for detection and/or diagnosis of SARS-CoV-2 by FDA under an Emergency Use Authorization (EUA). This EUA will remain in effect (meaning this test can be used) for the duration of the COVID-19 declaration under Section 564(b)(1) of the Act, 21 U.S.C. section 360bbb-3(b)(1), unless the authorization is terminated or revoked.  Performed at [facility], [address], [place], [place] [address]     Labs: CBC: Recent Labs  Lab 12/19/24 1724 12/22/24 0427 12/23/24 0440  WBC 12.0* 7.3 4.9  HGB 14.4 12.7 12.8  HCT 46.0 40.5 41.6  MCV 84.9 85.1 84.9  PLT 141* 164 142*   Basic Metabolic Panel: Recent Labs  Lab 12/19/24 1724  NA 143  K 4.3  CL 108  CO2 26  GLUCOSE 182*  BUN 11  CREATININE 0.72  CALCIUM  10.6*   Liver Function Tests:  No results for input(s): AST, ALT, ALKPHOS, BILITOT, PROT, ALBUMIN in the last 168 hours. CBG: Recent Labs  Lab 12/22/24 0746 12/22/24 1204 12/22/24 1703 12/22/24 2118 12/23/24 0740  GLUCAP 118* 151* 138* 242* 164*    Discharge time spent: 35 minutes.  Signed: Concepcion Riser, MD [facility] 12/23/2024 "

## 2024-12-24 DIAGNOSIS — E042 Nontoxic multinodular goiter: Secondary | ICD-10-CM | POA: Insufficient documentation

## 2024-12-24 NOTE — Discharge Summary (Signed)
 " Physician Discharge Summary   Patient: Alexandra Williams MRN: [id number] DOB: 1941-10-24  Admit date:     12/19/2024  Discharge date: 12/23/2024  Discharge Physician: Concepcion Riser   PCP: [name], MD   Recommendations at discharge:    PCP follow up in 1 week. Hypodense thyroid  nodules measuring up to 1.7 cm; recommend non-emergent thyroid  ultrasound. 3.  Consider pulmonology evaluation for PFTs.  Discharge Diagnoses: Principal Problem:   Acute bronchitis Active Problems:   Mixed hyperlipidemia   Lymphedema   Morbid obesity (HCC)   Essential hypertension   Diabetes type 2, controlled (HCC)   Multiple thyroid  nodules  Resolved Problems:   * No resolved hospital problems. *  Hospital Course: Alexandra Williams is a 84 y.o. female with medical history significant of with hx of HTN, HLD who presented with cough and sob for last 24 hours. Patients developed a productive cough and sob. She went to PCP who foundd her hypoxic and recommended presentation to the ED. On arrival she was afebrile and hypoxic satting in the high 80s. She was placed on supplemental oxygen.  Labs were obtained and present Tatian which showed BMP unrevealing, WBC 12.0, globin 14.4, BNP 246.  CT chest was obtained which showed no acute findings.  Chest x-ray showed no acute findings.    Assessment and Plan: Acute hypoxic respiratory failure- Acute bronchitis- no evidence of pneumonia. Possible reactive airway disease Patient does have productive cough, wheezing, has leukocytosis and hypoxia upon presentation. She improved with duonebs, mucinex . Advised to finish azithromycin  for 3 days. Her supplemental oxygen gradually weaned off. PT/ OT evaluation advised HH services. I advised her to follow PCP upon discharge. Consider pulmonology evaluation for PFTs.   Hypertension: continue amlodipine , carvedilol  and irbesartan .   Hyperlipidemia: resumed statin therapy.   Chronic lymphedema: Continue  supportive care. Does not prefer to take lasix , has been doing lymphedema treatment.  Type 2 diabetes- Resumed home dose glipizide, toujeo ..  Thyroid  nodules- CT chest showed hypodense thyroid  nodules measuring up to 1.7 cm; recommend non-emergent thyroid  ultrasound.   Morbid obesity BMI 47.44 Diet, exercise and weight reduction advised.      Consultants: none Procedures performed: none  Disposition: Home health Diet recommendation:  Discharge Diet Orders (From admission, onward)     Start     Ordered   12/23/24 0000  Diet - low sodium heart healthy        12/23/24 0935   12/23/24 0000  Diet Carb Modified        12/23/24 0935           Cardiac and Carb modified diet DISCHARGE MEDICATION: Allergies as of 12/23/2024       Reactions   Benzonatate Hives, Itching   Codeine Hives, Rash   Peanut Oil Hives, Rash   Shellfish Allergy Hives   Sulfites Hives, Other (See Comments)   Preservatives   Cannabidiol Hives   Hylan G-f 20    Iodinated Contrast Media Hives, Swelling   Metformin Hcl Diarrhea   Other Other (See Comments)   Polyester Fibers   Peanut-containing Drug Products    Strawberry Extract    Tramadol Other (See Comments)   Does not tolerate well - Felt funny and LSD Trip   Amoxicillin Hives, Rash   Amoxicillin-pot Clavulanate Hives, Dermatitis   Elemental Sulfur Itching   Shellfish Protein-containing Drug Products Rash   Sulfa Antibiotics Itching, Rash        Medication List     TAKE these medications  acetaminophen  650 MG CR tablet Commonly known as: TYLENOL  Take 650 mg by mouth every 8 (eight) hours as needed for pain or fever.   albuterol  108 (90 Base) MCG/ACT inhaler Commonly known as: VENTOLIN  HFA Inhale 2 puffs into the lungs every 6 (six) hours as needed for wheezing or shortness of breath.   amLODipine  10 MG tablet Commonly known as: NORVASC  Take 1 tablet (10 mg total) by mouth daily. What changed: when to take this   aspirin  81 MG tablet Take 81 mg by mouth at bedtime.   atorvastatin  40 MG tablet Commonly known as: LIPITOR TAKE ONE TABLET BY MOUTH DAILY   azithromycin  250 MG tablet Commonly known as: ZITHROMAX  Take 1 tablet (250 mg total) by mouth daily.   carvedilol  25 MG tablet Commonly known as: COREG  Take 1 tablet (25 mg total) by mouth 2 (two) times daily.   cyanocobalamin  100 MCG tablet Take 1 tablet by mouth daily.   EpiPen 2-Pak 0.3 MG/0.3ML Soaj injection Generic drug: EPINEPHrine Inject 0.3 mg into the muscle as needed (HIVES).   fexofenadine 180 MG tablet Commonly known as: ALLEGRA Take 180-350 mg by mouth See admin instructions. Take 180mg  (1 tablet) by mouth in the morning and 360mg  (2 tablets) at night.   glipiZIDE 5 MG 24 hr tablet Commonly known as: GLUCOTROL XL Take 5 mg by mouth daily with breakfast.   guaiFENesin  600 MG 12 hr tablet Commonly known as: MUCINEX  Take 2 tablets (1,200 mg total) by mouth 2 (two) times daily for 10 days.   hydroxychloroquine 200 MG tablet Commonly known as: PLAQUENIL Take 200 mg by mouth 2 (two) times daily.   multivitamin tablet Take 1 tablet by mouth daily.   olmesartan 40 MG tablet Commonly known as: BENICAR Take 40 mg by mouth every evening.   TOUJEO  MAX SOLOSTAR [place] Inject 16 Units into the skin at bedtime.   zolpidem  5 MG tablet Commonly known as: AMBIEN  TAKE ONE TABLET BY MOUTH AT BEDTIME        Discharge Exam: Filed Weights   12/19/24 2356  Weight: 106.5 kg      12/23/2024    9:09 AM 12/23/2024    5:38 AM 12/22/2024    9:17 PM  Vitals with BMI  Systolic 154 175 824  Diastolic 79 85 77  Pulse 68 66 69    General - Elderly Caucasian female, no apparent distress HEENT - PERRLA, EOMI, atraumatic head, non tender sinuses. Lung - distant breath sounds, no rales, rhonchi, diffuse wheezes improved. Heart - S1, S2 heard, no murmurs, rubs, chronic bilateral pedal edema. Abdomen - Soft, non tender, bowel sounds good Neuro -  Alert, awake and oriented x 3, non focal exam. Skin - Warm and dry.  Condition at discharge: stable  The results of significant diagnostics from this hospitalization (including imaging, microbiology, ancillary and laboratory) are listed below for reference.   Imaging Studies: CT Chest Wo Contrast Result Date: 12/19/2024 EXAM: CT CHEST WITHOUT CONTRAST 12/19/2024 07:30:05 PM TECHNIQUE: CT of the chest was performed without the administration of intravenous contrast. Multiplanar reformatted images are provided for review. Automated exposure control, iterative reconstruction, and/or weight based adjustment of the mA/kV was utilized to reduce the radiation dose to as low as reasonably achievable. COMPARISON: Chest x-ray 12/19/2024. CLINICAL HISTORY: Pneumonia, complication suspected, xray done. FINDINGS: MEDIASTINUM: Heart: At least 3-vessel coronary artery calcifications. Pericardium is unremarkable. The central airways are clear. Thoracic aorta and main pulmonary arteries are normal in caliber. Mild atherosclerotic plaque.  Thyroid  with hypodense nodules measuring up to 1.7 cm. Esophagus is unremarkable. LYMPH NODES: No gross hilar adenopathy with limited evaluation on this noncontrast study. No mediastinal or axillary lymphadenopathy. LUNGS AND PLEURA: Lingular atelectasis. Bibasilar atelectasis. Right lower lobe subpleural micronodules (3: 115). No pulmonary edema. No pleural effusion or pneumothorax. SOFT TISSUES/BONES: Spondyl noted. Multilevel degenerative changes of the spine. No acute abnormality of the soft tissues. UPPER ABDOMEN: Limited images of the upper abdomen demonstrate an atrophic pancreas partially visualized. IMPRESSION: 1. No acute findings. 2. Hypodense thyroid  nodules measuring up to 1.7 cm; recommend non-emergent thyroid  ultrasound. 3. Right lower lobe subpleural micronodules; no routine follow-up imaging is recommended per Fleischner Society Guidelines. Electronically signed by: Morgane  Naveau MD 12/19/2024 07:44 PM EST RP Workstation: [id number]   DG Chest 2 View Result Date: 12/19/2024 CLINICAL DATA:  Shortness of breath cough EXAM: CHEST - 2 VIEW COMPARISON:  11/03/2013 FINDINGS: Mild cardiomegaly with slight central congestion. No focal opacity, pleural effusion or pneumothorax. Aortic atherosclerosis IMPRESSION: Mild cardiomegaly with slight central congestion. Electronically Signed   By: Luke Bun M.D.   On: 12/19/2024 18:49    Microbiology: Results for orders placed or performed during the hospital encounter of 12/19/24  Resp panel by RT-PCR (RSV, Flu A&B, Covid) Anterior Nasal Swab     Status: None   Collection Time: 12/19/24  5:39 PM   Specimen: Anterior Nasal Swab  Result Value Ref Range Status   SARS Coronavirus 2 by RT PCR NEGATIVE NEGATIVE Final    Comment: (NOTE) SARS-CoV-2 target nucleic acids are NOT DETECTED.  The SARS-CoV-2 RNA is generally detectable in upper respiratory specimens during the acute phase of infection. The lowest concentration of SARS-CoV-2 viral copies this assay can detect is 138 copies/mL. A negative result does not preclude SARS-Cov-2 infection and should not be used as the sole basis for treatment or other patient management decisions. A negative result may occur with  improper specimen collection/handling, submission of specimen other than nasopharyngeal swab, presence of viral mutation(s) within the areas targeted by this assay, and inadequate number of viral copies(<138 copies/mL). A negative result must be combined with clinical observations, patient history, and epidemiological information. The expected result is Negative.  Fact Sheet for Patients:  bloggercourse.com  Fact Sheet for Healthcare Providers:  seriousbroker.it  This test is no t yet approved or cleared by the [place]  FDA and  has been authorized for detection and/or diagnosis of SARS-CoV-2 by FDA  under an Emergency Use Authorization (EUA). This EUA will remain  in effect (meaning this test can be used) for the duration of the COVID-19 declaration under Section 564(b)(1) of the Act, 21 U.S.C.section 360bbb-3(b)(1), unless the authorization is terminated  or revoked sooner.       Influenza A by PCR NEGATIVE NEGATIVE Final   Influenza B by PCR NEGATIVE NEGATIVE Final    Comment: (NOTE) The Xpert Xpress SARS-CoV-2/FLU/RSV plus assay is intended as an aid in the diagnosis of influenza from Nasopharyngeal swab specimens and should not be used as a sole basis for treatment. Nasal washings and aspirates are unacceptable for Xpert Xpress SARS-CoV-2/FLU/RSV testing.  Fact Sheet for Patients: bloggercourse.com  Fact Sheet for Healthcare Providers: seriousbroker.it  This test is not yet approved or cleared by the [place]  FDA and has been authorized for detection and/or diagnosis of SARS-CoV-2 by FDA under an Emergency Use Authorization (EUA). This EUA will remain in effect (meaning this test can be used) for the duration of the COVID-19 declaration under  Section 564(b)(1) of the Act, 21 U.S.C. section 360bbb-3(b)(1), unless the authorization is terminated or revoked.     Resp Syncytial Virus by PCR NEGATIVE NEGATIVE Final    Comment: (NOTE) Fact Sheet for Patients: bloggercourse.com  Fact Sheet for Healthcare Providers: seriousbroker.it  This test is not yet approved or cleared by the [place]  FDA and has been authorized for detection and/or diagnosis of SARS-CoV-2 by FDA under an Emergency Use Authorization (EUA). This EUA will remain in effect (meaning this test can be used) for the duration of the COVID-19 declaration under Section 564(b)(1) of the Act, 21 U.S.C. section 360bbb-3(b)(1), unless the authorization is terminated or revoked.  Performed at [facility], [address] [place], [place] [address]     Labs: CBC: Recent Labs  Lab 12/19/24 1724 12/22/24 0427 12/23/24 0440  WBC 12.0* 7.3 4.9  HGB 14.4 12.7 12.8  HCT 46.0 40.5 41.6  MCV 84.9 85.1 84.9  PLT 141* 164 142*   Basic Metabolic Panel: Recent Labs  Lab 12/19/24 1724  NA 143  K 4.3  CL 108  CO2 26  GLUCOSE 182*  BUN 11  CREATININE 0.72  CALCIUM  10.6*   Liver Function Tests: No results for input(s): AST, ALT, ALKPHOS, BILITOT, PROT, ALBUMIN in the last 168 hours. CBG: Recent Labs  Lab 12/22/24 1204 12/22/24 1703 12/22/24 2118 12/23/24 0740 12/23/24 1151  GLUCAP 151* 138* 242* 164* 128*    Discharge time spent: 36 minutes.  Signed: Concepcion Riser, MD [facility] 12/24/2024 "

## 2025-01-02 ENCOUNTER — Other Ambulatory Visit: Payer: Self-pay | Admitting: Family Medicine

## 2025-01-02 ENCOUNTER — Other Ambulatory Visit (HOSPITAL_BASED_OUTPATIENT_CLINIC_OR_DEPARTMENT_OTHER): Payer: Self-pay | Admitting: Obstetrics & Gynecology

## 2025-01-02 DIAGNOSIS — E041 Nontoxic single thyroid nodule: Secondary | ICD-10-CM

## 2025-01-02 DIAGNOSIS — F5101 Primary insomnia: Secondary | ICD-10-CM

## 2025-01-09 ENCOUNTER — Ambulatory Visit
Admission: RE | Admit: 2025-01-09 | Discharge: 2025-01-09 | Disposition: A | Source: Ambulatory Visit | Attending: Family Medicine | Admitting: Family Medicine

## 2025-01-09 DIAGNOSIS — E041 Nontoxic single thyroid nodule: Secondary | ICD-10-CM

## 2025-01-14 ENCOUNTER — Other Ambulatory Visit: Payer: Self-pay | Admitting: Family Medicine

## 2025-01-14 DIAGNOSIS — E041 Nontoxic single thyroid nodule: Secondary | ICD-10-CM

## 2025-02-25 ENCOUNTER — Other Ambulatory Visit

## 2025-03-19 ENCOUNTER — Ambulatory Visit: Admitting: Physician Assistant

## 2025-07-09 ENCOUNTER — Ambulatory Visit (HOSPITAL_BASED_OUTPATIENT_CLINIC_OR_DEPARTMENT_OTHER): Payer: Medicare Other | Admitting: Obstetrics & Gynecology
